# Patient Record
Sex: Male | Born: 1985 | Race: White | Hispanic: No | Marital: Single | Smoking: Former smoker
Health system: Southern US, Community
[De-identification: ages and names within clinical notes are randomized; demographics above are authoritative.]

## PROBLEM LIST (undated history)

## (undated) DIAGNOSIS — D751 Secondary polycythemia: Secondary | ICD-10-CM

## (undated) DIAGNOSIS — A630 Anogenital (venereal) warts: Secondary | ICD-10-CM

## (undated) DIAGNOSIS — M549 Dorsalgia, unspecified: Secondary | ICD-10-CM

## (undated) DIAGNOSIS — R002 Palpitations: Secondary | ICD-10-CM

## (undated) DIAGNOSIS — F988 Other specified behavioral and emotional disorders with onset usually occurring in childhood and adolescence: Secondary | ICD-10-CM

## (undated) DIAGNOSIS — I10 Essential (primary) hypertension: Secondary | ICD-10-CM

## (undated) DIAGNOSIS — F17200 Nicotine dependence, unspecified, uncomplicated: Secondary | ICD-10-CM

## (undated) DIAGNOSIS — G4700 Insomnia, unspecified: Secondary | ICD-10-CM

## (undated) DIAGNOSIS — L408 Other psoriasis: Secondary | ICD-10-CM

## (undated) DIAGNOSIS — K921 Melena: Secondary | ICD-10-CM

## (undated) DIAGNOSIS — K219 Gastro-esophageal reflux disease without esophagitis: Secondary | ICD-10-CM

## (undated) HISTORY — PX: HAND SURGERY: SHX662

## (undated) HISTORY — DX: Anogenital (venereal) warts: A63.0

## (undated) HISTORY — PX: WISDOM TOOTH EXTRACTION: SHX21

## (undated) HISTORY — DX: Secondary polycythemia: D75.1

## (undated) HISTORY — DX: Dorsalgia, unspecified: M54.9

## (undated) HISTORY — DX: Gastro-esophageal reflux disease without esophagitis: K21.9

## (undated) HISTORY — DX: Nicotine dependence, unspecified, uncomplicated: F17.200

## (undated) HISTORY — DX: Other specified behavioral and emotional disorders with onset usually occurring in childhood and adolescence: F98.8

## (undated) HISTORY — DX: Essential (primary) hypertension: I10

## (undated) HISTORY — DX: Palpitations: R00.2

## (undated) HISTORY — DX: Insomnia, unspecified: G47.00

## (undated) HISTORY — DX: Other psoriasis: L40.8

## (undated) HISTORY — DX: Melena: K92.1

---
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
## 2003-06-01 ENCOUNTER — Emergency Department (HOSPITAL_COMMUNITY): Admission: EM | Admit: 2003-06-01 | Discharge: 2003-06-01 | Payer: Self-pay | Admitting: Emergency Medicine

## 2004-05-08 ENCOUNTER — Emergency Department (HOSPITAL_COMMUNITY): Admission: EM | Admit: 2004-05-08 | Discharge: 2004-05-08 | Payer: Self-pay | Admitting: Nurse Practitioner

## 2004-05-08 ENCOUNTER — Ambulatory Visit: Payer: Self-pay | Admitting: Endocrinology

## 2004-05-12 ENCOUNTER — Ambulatory Visit (HOSPITAL_COMMUNITY): Admission: RE | Admit: 2004-05-12 | Discharge: 2004-05-12 | Payer: Self-pay | Admitting: Endocrinology

## 2004-09-08 ENCOUNTER — Emergency Department (HOSPITAL_COMMUNITY): Admission: EM | Admit: 2004-09-08 | Discharge: 2004-09-08 | Payer: Self-pay | Admitting: Emergency Medicine

## 2004-09-14 ENCOUNTER — Ambulatory Visit: Payer: Self-pay | Admitting: Internal Medicine

## 2004-09-24 ENCOUNTER — Ambulatory Visit: Payer: Self-pay

## 2005-07-05 ENCOUNTER — Ambulatory Visit: Payer: Self-pay | Admitting: Internal Medicine

## 2006-10-19 ENCOUNTER — Ambulatory Visit: Payer: Self-pay | Admitting: Internal Medicine

## 2006-10-20 ENCOUNTER — Encounter: Payer: Self-pay | Admitting: Internal Medicine

## 2006-10-20 DIAGNOSIS — K219 Gastro-esophageal reflux disease without esophagitis: Secondary | ICD-10-CM

## 2006-10-20 DIAGNOSIS — F988 Other specified behavioral and emotional disorders with onset usually occurring in childhood and adolescence: Secondary | ICD-10-CM

## 2006-10-20 DIAGNOSIS — L408 Other psoriasis: Secondary | ICD-10-CM

## 2006-10-20 HISTORY — DX: Other psoriasis: L40.8

## 2006-10-20 HISTORY — DX: Other specified behavioral and emotional disorders with onset usually occurring in childhood and adolescence: F98.8

## 2006-10-20 HISTORY — DX: Gastro-esophageal reflux disease without esophagitis: K21.9

## 2006-11-23 ENCOUNTER — Emergency Department (HOSPITAL_COMMUNITY): Admission: EM | Admit: 2006-11-23 | Discharge: 2006-11-23 | Payer: Self-pay | Admitting: Emergency Medicine

## 2006-12-11 ENCOUNTER — Emergency Department (HOSPITAL_COMMUNITY): Admission: EM | Admit: 2006-12-11 | Discharge: 2006-12-11 | Payer: Self-pay | Admitting: *Deleted

## 2006-12-22 ENCOUNTER — Telehealth: Payer: Self-pay | Admitting: Internal Medicine

## 2006-12-23 ENCOUNTER — Telehealth (INDEPENDENT_AMBULATORY_CARE_PROVIDER_SITE_OTHER): Payer: Self-pay | Admitting: *Deleted

## 2007-01-23 ENCOUNTER — Telehealth (INDEPENDENT_AMBULATORY_CARE_PROVIDER_SITE_OTHER): Payer: Self-pay | Admitting: *Deleted

## 2007-02-22 ENCOUNTER — Telehealth (INDEPENDENT_AMBULATORY_CARE_PROVIDER_SITE_OTHER): Payer: Self-pay | Admitting: *Deleted

## 2007-03-27 ENCOUNTER — Telehealth: Payer: Self-pay | Admitting: Internal Medicine

## 2007-04-27 ENCOUNTER — Telehealth: Payer: Self-pay | Admitting: Internal Medicine

## 2007-05-07 ENCOUNTER — Emergency Department (HOSPITAL_COMMUNITY): Admission: EM | Admit: 2007-05-07 | Discharge: 2007-05-08 | Payer: Self-pay | Admitting: Emergency Medicine

## 2007-05-15 ENCOUNTER — Ambulatory Visit: Payer: Self-pay | Admitting: Internal Medicine

## 2007-05-15 DIAGNOSIS — F17200 Nicotine dependence, unspecified, uncomplicated: Secondary | ICD-10-CM

## 2007-05-15 DIAGNOSIS — G4700 Insomnia, unspecified: Secondary | ICD-10-CM

## 2007-05-15 DIAGNOSIS — A630 Anogenital (venereal) warts: Secondary | ICD-10-CM

## 2007-05-15 DIAGNOSIS — M549 Dorsalgia, unspecified: Secondary | ICD-10-CM

## 2007-05-15 HISTORY — DX: Dorsalgia, unspecified: M54.9

## 2007-05-15 HISTORY — DX: Nicotine dependence, unspecified, uncomplicated: F17.200

## 2007-05-15 HISTORY — DX: Insomnia, unspecified: G47.00

## 2007-05-15 HISTORY — DX: Anogenital (venereal) warts: A63.0

## 2007-05-26 ENCOUNTER — Telehealth (INDEPENDENT_AMBULATORY_CARE_PROVIDER_SITE_OTHER): Payer: Self-pay | Admitting: *Deleted

## 2007-07-06 ENCOUNTER — Telehealth: Payer: Self-pay | Admitting: Internal Medicine

## 2007-08-07 ENCOUNTER — Telehealth: Payer: Self-pay | Admitting: Internal Medicine

## 2007-09-05 ENCOUNTER — Telehealth: Payer: Self-pay | Admitting: Internal Medicine

## 2007-10-03 ENCOUNTER — Telehealth (INDEPENDENT_AMBULATORY_CARE_PROVIDER_SITE_OTHER): Payer: Self-pay | Admitting: *Deleted

## 2007-11-06 ENCOUNTER — Telehealth (INDEPENDENT_AMBULATORY_CARE_PROVIDER_SITE_OTHER): Payer: Self-pay | Admitting: *Deleted

## 2007-12-12 ENCOUNTER — Telehealth (INDEPENDENT_AMBULATORY_CARE_PROVIDER_SITE_OTHER): Payer: Self-pay | Admitting: *Deleted

## 2008-01-09 ENCOUNTER — Telehealth (INDEPENDENT_AMBULATORY_CARE_PROVIDER_SITE_OTHER): Payer: Self-pay | Admitting: *Deleted

## 2008-01-25 ENCOUNTER — Ambulatory Visit: Payer: Self-pay | Admitting: Internal Medicine

## 2008-01-25 DIAGNOSIS — I10 Essential (primary) hypertension: Secondary | ICD-10-CM

## 2008-01-25 DIAGNOSIS — R002 Palpitations: Secondary | ICD-10-CM

## 2008-01-25 HISTORY — DX: Palpitations: R00.2

## 2008-01-25 HISTORY — DX: Essential (primary) hypertension: I10

## 2008-01-25 LAB — CONVERTED CEMR LAB
BUN: 9 mg/dL (ref 6–23)
Basophils Relative: 0.1 % (ref 0.0–3.0)
CO2: 31 meq/L (ref 19–32)
Eosinophils Absolute: 0.4 10*3/uL (ref 0.0–0.7)
Eosinophils Relative: 2.9 % (ref 0.0–5.0)
GFR calc Af Amer: 153 mL/min
GFR calc non Af Amer: 126 mL/min
Hemoglobin: 18.4 g/dL — ABNORMAL HIGH (ref 13.0–17.0)
Lymphocytes Relative: 20.7 % (ref 12.0–46.0)
MCHC: 35.5 g/dL (ref 30.0–36.0)
Monocytes Absolute: 0.9 10*3/uL (ref 0.1–1.0)
Monocytes Relative: 7.2 % (ref 3.0–12.0)
WBC: 12.5 10*3/uL — ABNORMAL HIGH (ref 4.5–10.5)

## 2008-02-05 ENCOUNTER — Telehealth (INDEPENDENT_AMBULATORY_CARE_PROVIDER_SITE_OTHER): Payer: Self-pay | Admitting: *Deleted

## 2008-03-08 ENCOUNTER — Telehealth (INDEPENDENT_AMBULATORY_CARE_PROVIDER_SITE_OTHER): Payer: Self-pay | Admitting: *Deleted

## 2008-04-04 ENCOUNTER — Telehealth (INDEPENDENT_AMBULATORY_CARE_PROVIDER_SITE_OTHER): Payer: Self-pay | Admitting: *Deleted

## 2008-05-07 ENCOUNTER — Telehealth (INDEPENDENT_AMBULATORY_CARE_PROVIDER_SITE_OTHER): Payer: Self-pay | Admitting: *Deleted

## 2008-05-31 ENCOUNTER — Ambulatory Visit: Payer: Self-pay | Admitting: Internal Medicine

## 2008-05-31 ENCOUNTER — Encounter: Payer: Self-pay | Admitting: Internal Medicine

## 2008-05-31 DIAGNOSIS — S93409A Sprain of unspecified ligament of unspecified ankle, initial encounter: Secondary | ICD-10-CM | POA: Insufficient documentation

## 2008-05-31 LAB — CONVERTED CEMR LAB
BUN: 10 mg/dL (ref 6–23)
Basophils Absolute: 0 10*3/uL (ref 0.0–0.1)
Basophils Relative: 0 % (ref 0.0–3.0)
CO2: 32 meq/L (ref 19–32)
Calcium: 8.7 mg/dL (ref 8.4–10.5)
Chloride: 105 meq/L (ref 96–112)
Creatinine, Ser: 0.8 mg/dL (ref 0.4–1.5)
Eosinophils Absolute: 0.5 10*3/uL (ref 0.0–0.7)
Eosinophils Relative: 5.5 % — ABNORMAL HIGH (ref 0.0–5.0)
GFR calc non Af Amer: 125.07 mL/min (ref >60)
Glucose, Bld: 81 mg/dL (ref 70–99)
HCT: 48.6 % (ref 39.0–52.0)
Hemoglobin: 17.4 g/dL — ABNORMAL HIGH (ref 13.0–17.0)
Lymphocytes Relative: 29.1 % (ref 12.0–46.0)
Lymphs Abs: 2.8 10*3/uL (ref 0.7–4.0)
MCHC: 35.8 g/dL (ref 30.0–36.0)
MCV: 92.5 fL (ref 78.0–100.0)
Monocytes Absolute: 0.6 10*3/uL (ref 0.1–1.0)
Monocytes Relative: 6.7 % (ref 3.0–12.0)
Neutro Abs: 5.7 10*3/uL (ref 1.4–7.7)
Neutrophils Relative %: 58.7 % (ref 43.0–77.0)
Platelets: 257 10*3/uL (ref 150.0–400.0)
Potassium: 3.8 meq/L (ref 3.5–5.1)
RBC: 5.25 M/uL (ref 4.22–5.81)
RDW: 11.7 % (ref 11.5–14.6)
Sodium: 141 meq/L (ref 135–145)
TSH: 1.83 microintl units/mL (ref 0.35–5.50)
WBC: 9.6 10*3/uL (ref 4.5–10.5)

## 2008-06-03 ENCOUNTER — Telehealth (INDEPENDENT_AMBULATORY_CARE_PROVIDER_SITE_OTHER): Payer: Self-pay | Admitting: *Deleted

## 2008-06-11 ENCOUNTER — Ambulatory Visit: Payer: Self-pay

## 2008-06-11 ENCOUNTER — Encounter: Payer: Self-pay | Admitting: Internal Medicine

## 2008-07-08 ENCOUNTER — Telehealth (INDEPENDENT_AMBULATORY_CARE_PROVIDER_SITE_OTHER): Payer: Self-pay | Admitting: *Deleted

## 2008-08-13 ENCOUNTER — Telehealth (INDEPENDENT_AMBULATORY_CARE_PROVIDER_SITE_OTHER): Payer: Self-pay | Admitting: *Deleted

## 2008-09-04 ENCOUNTER — Ambulatory Visit: Payer: Self-pay | Admitting: Internal Medicine

## 2008-09-04 DIAGNOSIS — R21 Rash and other nonspecific skin eruption: Secondary | ICD-10-CM | POA: Insufficient documentation

## 2008-09-05 ENCOUNTER — Encounter: Payer: Self-pay | Admitting: Internal Medicine

## 2008-09-05 ENCOUNTER — Telehealth: Payer: Self-pay | Admitting: Internal Medicine

## 2008-09-06 ENCOUNTER — Telehealth: Payer: Self-pay | Admitting: Internal Medicine

## 2008-09-10 ENCOUNTER — Telehealth (INDEPENDENT_AMBULATORY_CARE_PROVIDER_SITE_OTHER): Payer: Self-pay | Admitting: *Deleted

## 2008-09-11 ENCOUNTER — Telehealth: Payer: Self-pay | Admitting: Internal Medicine

## 2008-10-14 ENCOUNTER — Telehealth (INDEPENDENT_AMBULATORY_CARE_PROVIDER_SITE_OTHER): Payer: Self-pay | Admitting: *Deleted

## 2008-11-14 ENCOUNTER — Telehealth: Payer: Self-pay | Admitting: Internal Medicine

## 2008-12-18 ENCOUNTER — Telehealth: Payer: Self-pay | Admitting: Internal Medicine

## 2009-01-13 ENCOUNTER — Telehealth: Payer: Self-pay | Admitting: Internal Medicine

## 2009-02-10 ENCOUNTER — Telehealth: Payer: Self-pay | Admitting: Internal Medicine

## 2009-03-17 ENCOUNTER — Telehealth: Payer: Self-pay | Admitting: Internal Medicine

## 2009-04-13 ENCOUNTER — Emergency Department (HOSPITAL_COMMUNITY): Admission: EM | Admit: 2009-04-13 | Discharge: 2009-04-13 | Payer: Self-pay | Admitting: Emergency Medicine

## 2009-04-15 ENCOUNTER — Emergency Department (HOSPITAL_COMMUNITY): Admission: EM | Admit: 2009-04-15 | Discharge: 2009-04-15 | Payer: Self-pay | Admitting: Emergency Medicine

## 2009-04-22 ENCOUNTER — Telehealth: Payer: Self-pay | Admitting: Internal Medicine

## 2009-05-19 ENCOUNTER — Telehealth: Payer: Self-pay | Admitting: Internal Medicine

## 2009-05-20 ENCOUNTER — Ambulatory Visit: Payer: Self-pay | Admitting: Internal Medicine

## 2009-05-20 DIAGNOSIS — J019 Acute sinusitis, unspecified: Secondary | ICD-10-CM

## 2009-05-20 DIAGNOSIS — D751 Secondary polycythemia: Secondary | ICD-10-CM

## 2009-05-20 HISTORY — DX: Secondary polycythemia: D75.1

## 2009-05-20 LAB — CONVERTED CEMR LAB
BUN: 9 mg/dL (ref 6–23)
Calcium: 10.3 mg/dL (ref 8.4–10.5)
Hemoglobin: 17.4 g/dL — ABNORMAL HIGH (ref 13.0–17.0)
Lymphocytes Relative: 29.6 % (ref 12.0–46.0)
Lymphs Abs: 2.8 10*3/uL (ref 0.7–4.0)
MCV: 93.1 fL (ref 78.0–100.0)
Monocytes Absolute: 0.9 10*3/uL (ref 0.1–1.0)
Monocytes Relative: 9.2 % (ref 3.0–12.0)
Neutrophils Relative %: 55.8 % (ref 43.0–77.0)
RBC: 5.52 M/uL (ref 4.22–5.81)
Sodium: 141 meq/L (ref 135–145)
WBC: 9.3 10*3/uL (ref 4.5–10.5)

## 2009-05-22 ENCOUNTER — Encounter (INDEPENDENT_AMBULATORY_CARE_PROVIDER_SITE_OTHER): Payer: Self-pay | Admitting: *Deleted

## 2009-05-22 ENCOUNTER — Telehealth: Payer: Self-pay | Admitting: Internal Medicine

## 2009-06-16 ENCOUNTER — Telehealth: Payer: Self-pay | Admitting: Internal Medicine

## 2009-07-18 ENCOUNTER — Telehealth: Payer: Self-pay | Admitting: Internal Medicine

## 2009-08-20 ENCOUNTER — Telehealth: Payer: Self-pay | Admitting: Internal Medicine

## 2009-09-19 ENCOUNTER — Telehealth: Payer: Self-pay | Admitting: Internal Medicine

## 2009-10-17 ENCOUNTER — Telehealth: Payer: Self-pay | Admitting: Internal Medicine

## 2009-11-21 ENCOUNTER — Telehealth: Payer: Self-pay | Admitting: Internal Medicine

## 2009-12-09 ENCOUNTER — Ambulatory Visit: Payer: Self-pay | Admitting: Internal Medicine

## 2009-12-09 ENCOUNTER — Encounter: Payer: Self-pay | Admitting: Internal Medicine

## 2009-12-09 DIAGNOSIS — M25539 Pain in unspecified wrist: Secondary | ICD-10-CM

## 2009-12-09 DIAGNOSIS — M25579 Pain in unspecified ankle and joints of unspecified foot: Secondary | ICD-10-CM

## 2009-12-15 ENCOUNTER — Telehealth (INDEPENDENT_AMBULATORY_CARE_PROVIDER_SITE_OTHER): Payer: Self-pay | Admitting: *Deleted

## 2009-12-16 ENCOUNTER — Telehealth: Payer: Self-pay | Admitting: Internal Medicine

## 2009-12-16 ENCOUNTER — Encounter (INDEPENDENT_AMBULATORY_CARE_PROVIDER_SITE_OTHER): Payer: Self-pay | Admitting: *Deleted

## 2009-12-17 ENCOUNTER — Telehealth (INDEPENDENT_AMBULATORY_CARE_PROVIDER_SITE_OTHER): Payer: Self-pay | Admitting: *Deleted

## 2009-12-18 ENCOUNTER — Ambulatory Visit: Payer: Self-pay | Admitting: Internal Medicine

## 2009-12-18 DIAGNOSIS — R1032 Left lower quadrant pain: Secondary | ICD-10-CM | POA: Insufficient documentation

## 2009-12-18 DIAGNOSIS — K921 Melena: Secondary | ICD-10-CM

## 2009-12-18 HISTORY — DX: Melena: K92.1

## 2009-12-18 LAB — CONVERTED CEMR LAB
Albumin: 4.4 g/dL (ref 3.5–5.2)
Alkaline Phosphatase: 82 units/L (ref 39–117)
BUN: 16 mg/dL (ref 6–23)
Basophils Absolute: 0.1 10*3/uL (ref 0.0–0.1)
Basophils Relative: 0.9 % (ref 0.0–3.0)
CO2: 32 meq/L (ref 19–32)
Calcium: 9.1 mg/dL (ref 8.4–10.5)
Chloride: 95 meq/L — ABNORMAL LOW (ref 96–112)
Creatinine, Ser: 0.8 mg/dL (ref 0.4–1.5)
Eosinophils Absolute: 0.5 10*3/uL (ref 0.0–0.7)
Eosinophils Relative: 4.6 % (ref 0.0–5.0)
GFR calc non Af Amer: 116.79 mL/min (ref >60)
Glucose, Bld: 79 mg/dL (ref 70–99)
HCT: 48 % (ref 39.0–52.0)
Hemoglobin, Urine: NEGATIVE
Hemoglobin: 17.2 g/dL — ABNORMAL HIGH (ref 13.0–17.0)
Ketones, ur: NEGATIVE mg/dL
Lymphocytes Relative: 21.1 % (ref 12.0–46.0)
Lymphs Abs: 2.1 10*3/uL (ref 0.7–4.0)
MCV: 91.4 fL (ref 78.0–100.0)
Monocytes Absolute: 1 10*3/uL (ref 0.1–1.0)
Neutro Abs: 6.5 10*3/uL (ref 1.4–7.7)
Neutrophils Relative %: 64 % (ref 43.0–77.0)
Potassium: 4.1 meq/L (ref 3.5–5.1)
Total Protein, Urine: NEGATIVE mg/dL
Urine Glucose: NEGATIVE mg/dL
Urobilinogen, UA: 1 (ref 0.0–1.0)
pH: 7 (ref 5.0–8.0)

## 2009-12-19 ENCOUNTER — Encounter: Payer: Self-pay | Admitting: Internal Medicine

## 2009-12-19 ENCOUNTER — Telehealth: Payer: Self-pay | Admitting: Internal Medicine

## 2009-12-19 ENCOUNTER — Telehealth (INDEPENDENT_AMBULATORY_CARE_PROVIDER_SITE_OTHER): Payer: Self-pay | Admitting: *Deleted

## 2010-02-23 ENCOUNTER — Encounter
Admission: RE | Admit: 2010-02-23 | Payer: Self-pay | Source: Home / Self Care | Attending: Orthopedic Surgery | Admitting: Orthopedic Surgery

## 2010-03-03 ENCOUNTER — Encounter
Admission: RE | Admit: 2010-03-03 | Discharge: 2010-03-24 | Payer: Self-pay | Source: Home / Self Care | Attending: Orthopedic Surgery | Admitting: Orthopedic Surgery

## 2010-03-24 ENCOUNTER — Telehealth: Payer: Self-pay | Admitting: Internal Medicine

## 2010-03-24 NOTE — Letter (Signed)
Summary: Out of Work  [facility]  [address] [place], [place] [address]   Phone: [phone]  Fax: [phone]    December 09, 2009   Employee:  Dasan T Schlereth    To Whom It May Concern:   For Medical reasons, please excuse the above named employee from work for the following dates:  Start:   oct 16. 2011  End:   Dec 14, 2009 - to return to work Dec 15, 2009  If you need additional information, please feel free to contact our office.         Sincerely,    Corwin Levins MD
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
## 2010-03-24 NOTE — Progress Notes (Signed)
Summary: Adderall  Phone Note Call from Patient Call back at [facility] Phone [phone]   Summary of Call: Patient is requesting change of med to Adderall 15mg  #120 due to shortage of medication.  Initial call taken by: Lamar Sprinkles, CMA,  December 19, 2009 12:09 PM    New/Updated Medications: ADDERALL 15 MG TABS (AMPHETAMINE-DEXTROAMPHETAMINE) 2 by mouth two times a day  - to fill Dec 19, 2009 Prescriptions: ADDERALL 15 MG TABS (AMPHETAMINE-DEXTROAMPHETAMINE) 2 by mouth two times a day  - to fill Dec 19, 2009  #120 x 0   Entered and Authorized by:   Corwin Levins MD   Signed by:   Corwin Levins MD on 12/19/2009   Method used:   Print then Give to Patient   RxID:   [id number]  done hardcopy to LIM side B - dahlia Corwin Levins MD  December 19, 2009 1:13 PM   Pt informed via VM, to bring back original Rx for new Rx Margaret Pyle, CMA  December 19, 2009 1:17 PM

## 2010-03-24 NOTE — Assessment & Plan Note (Signed)
Summary: COUGH,COLD,RUNNY NOSE/NO FEVER/CD   Vital Signs:  Patient profile:   25 year old male Height:      72 inches Weight:      230 pounds BMI:     31.31 O2 Sat:      98 % on Room air Temp:     98.1 degrees F oral Pulse rate:   88 / minute BP sitting:   112 / 70  (left arm) Cuff size:   large  Vitals Entered ByZella Ball Ewing (May 20, 2009 11:22 AM)  O2 Flow:  Room air CC: nasal congestion, cough, fatigue/RE   CC:  nasal congestion, cough, and fatigue/RE.  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 Today here with 6  days hx of worsening facial pain, pressure, fever and greenish d/c , especaily worse in the past 2 days, hearing been worse and muffled.    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 1 By Mouth Two Times A Day - To Fill May 21, 2009 2)  Tylenol .[name].. 1 Prn 3)  Advil .[name].. 1 Prn 4)  Ranitidine Hcl 300 Mg  Tabs (Ranitidine Hcl) .[name].. 1po Qd 5)  Tramadol Hcl 50 Mg  Tabs (Tramadol Hcl) .[name].. 1 - 2 By Mouth Qid Prn 6)  Zolpidem Tartrate 10 Mg  Tabs (Zolpidem Tartrate) .[name].. 1 By Mouth  At Bedtime As Needed 7)  Clobetasol Propionate 0.05 % Crea (Clobetasol Propionate) .... Use Asd Two Times A Day As Needed 8)  Metoprolol Tartrate 50 Mg Tabs (Metoprolol Tartrate) .... 1/2 By Mouth Two Times A Day 9)  Diclofenac Sodium 75 Mg Tbec (Diclofenac Sodium) .[name].. 1 By Mouth Two Times A Day As Needed 10)  Prednisone 10 Mg Tabs (Prednisone) .... 4po Qd For 3days, Then 3po Qd For 3days, Then 2po Qd For 3days, Then 1po Qd For 3 Days, Then Stop 11)  Hydroxyzine Hcl 25 Mg Tabs (Hydroxyzine Hcl) .[name].. 1 -2 By Mouth Q 6 Hrs As Needed Itching  Current Medications (verified): 1)  Adderall 30 Mg  Tabs (Amphetamine-Dextroamphetamine) .[name].. 1 By Mouth Two Times A Day - To Fill May 21, 2009 2)  Tylenol .[name].. 1 Prn 3)  Advil .[name].. 1 Prn 4)  Ranitidine Hcl 300 Mg  Tabs (Ranitidine Hcl) .[name].. 1po Qd 5)  Tramadol Hcl 50 Mg  Tabs (Tramadol Hcl) .[name].. 1 - 2 By Mouth Qid Prn 6)  Zolpidem Tartrate 10 Mg  Tabs (Zolpidem Tartrate) .[name].. 1 By Mouth At Bedtime As Needed 7)  Clobetasol Propionate 0.05 % Crea (Clobetasol Propionate) .... Use Asd Two Times A Day As Needed 8)  Metoprolol  Tartrate 50 Mg Tabs (Metoprolol Tartrate) .... 1/2 By Mouth Two Times A Day 9)  Diclofenac Sodium 75 Mg Tbec (Diclofenac Sodium) .[name].. 1 By Mouth Two Times A Day As Needed 10)  Hydroxyzine Hcl 25 Mg Tabs (Hydroxyzine Hcl) .[name].. 1 -2 By Mouth Q 6 Hrs As Needed Itching 11)  Dermazinc Spray 0.25 % Liqd (Pyrithione Zinc) .... Use Asd  - Otc 12)  Cephalexin 500 Mg Caps (Cephalexin) .[name].. 1 By Mouth Three Times A Day 13)  Chantix Continuing Month Pak 1 Mg Tabs (Varenicline Tartrate) .... Use Asd 1 By Mouth Once Daily  Allergies (verified): No Known Drug Allergies  Past History:  Past Surgical History: Last updated: 05/15/2007 Denies surgical history  Social History: Last updated: 05/15/2007 Single Current Smoker  Risk Factors: Smoking Status: current (05/15/2007)  Past Medical  History: add psoriasis GERD Hypertension palpitations polycythemia  Family History: Reviewed history from 01/25/2008 and no changes required. grandfather with HTN father with throat cancer  Review of Systems       all otherwise negative per pt -    Physical Exam  General:  alert and overweight-appearing., mild ill  Head:  normocephalic and atraumatic.   Eyes:  vision grossly intact, pupils equal, and pupils round.   Ears:  bilat tm's red, sinus tender bialt Nose:  nasal dischargemucosal pallor and mucosal edema.   Mouth:  pharyngeal erythema and fair dentition.   Neck:  supple and no masses.   Lungs:  normal respiratory effort and normal breath sounds.   Heart:  normal rate and regular rhythm.   Extremities:  no edema, no erythema  Skin:  large psoriatic plaque to elbows   Impression & Recommendations:  Problem # 1:  SINUSITIS- ACUTE-NOS (ICD-461.9)  His updated medication list for this problem includes:    Cephalexin 500 Mg Caps (Cephalexin) .[name]... 1 by mouth three times a day treat as above, f/u any worsening signs or symptoms   Problem # 2:  HYPERTENSION (ICD-401.9)  His updated medication list for this problem includes:    Metoprolol Tartrate 50 Mg Tabs (Metoprolol tartrate) .[name]... 1/2 by mouth two times a day  Orders: TLB-BMP (Basic Metabolic Panel-BMET) (80048-METABOL)  BP today: 112/70 Prior BP: 128/72 (09/04/2008)  Labs Reviewed: K+: 3.8 (05/31/2008) Creat: : 0.8 (05/31/2008)    stable overall by hx and exam, ok to continue meds/tx as is   Problem # 3:  PSORIASIS (ICD-696.1)  for OTC med per pt request, also refer derm  Orders: Dermatology Referral (Derma)  Problem # 4:  POLYCYTHEMIA (ICD-289.0)  re-check, may need heme eval, to quit smoking   Orders: TLB-CBC Platelet - w/Differential (85025-CBCD)  Complete Medication List: 1)  Adderall 30 Mg Tabs (Amphetamine-dextroamphetamine) .[name].. 1 by mouth two times a day - to fill May 21, 2009 2)   Tylenol  .[name].. 1 prn 3)  Advil  .[name].. 1 prn 4)  Ranitidine Hcl 300 Mg Tabs (Ranitidine hcl) .[name].. 1po qd 5)  Tramadol Hcl 50 Mg Tabs (Tramadol hcl) .[name].. 1 - 2 by mouth qid prn 6)  Zolpidem Tartrate 10 Mg Tabs (Zolpidem tartrate) .[name].. 1 by mouth at bedtime as needed 7)  Clobetasol Propionate 0.05 % Crea (Clobetasol propionate) .... Use asd two times a day as needed 8)  Metoprolol Tartrate 50 Mg Tabs (Metoprolol tartrate) .... 1/2 by mouth two times a day 9)  Diclofenac Sodium 75 Mg Tbec (Diclofenac sodium) .[name].. 1 by mouth two times a day as needed 10)  Hydroxyzine Hcl 25 Mg  Tabs (Hydroxyzine hcl) .[name].. 1 -2 by mouth q 6 hrs as needed itching 11)  Dermazinc Spray 0.25 % Liqd (Pyrithione zinc) .... Use asd  - otc 12)  Cephalexin 500 Mg Caps (Cephalexin) .[name].. 1 by mouth three times a day 13)  Chantix Continuing Month Pak 1 Mg Tabs (Varenicline tartrate) .... Use asd 1 by mouth once daily  Patient Instructions: 1)  Please take all new medications as prescribed - the antibitic 2)  you can try the dermazinc (it is OTC now), and the chantix 3)  You will be contacted about the referral(s) to: dermatology 4)  Continue all previous medications as before this visit  5)  Please go to the Lab in the basement for your blood tests (for the blood counts)  6)  Please schedule a follow-up appointment in 1 year or sooner if needed Prescriptions: CHANTIX CONTINUING MONTH PAK 1 MG TABS (VARENICLINE TARTRATE) use asd 1 by mouth once daily  #1pk x 1   Entered and Authorized by:   Corwin Levins MD   Signed by:   Corwin Levins MD on 05/20/2009   Method used:   Print then Give to Patient   RxID:   [id number] CHANTIX STARTING MONTH PAK 0.5 MG X 11 & 1 MG X 42 TABS (VARENICLINE TARTRATE) use asd 1 by mouth once daily  #1pk x 0   Entered and Authorized by:   Corwin Levins MD   Signed by:   Corwin Levins MD on 05/20/2009   Method used:   Print then Give to Patient   RxID:   [id number] CEPHALEXIN 500 MG CAPS  (CEPHALEXIN) 1 by mouth three times a day  #30 x 0   Entered and Authorized by:   Corwin Levins MD   Signed by:   Corwin Levins MD on 05/20/2009   Method used:   Print then Give to Patient   RxID:   [id number]
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
## 2010-03-24 NOTE — Letter (Signed)
Summary: Out of Work  [facility]  [address] [place], [place] [address]   Phone: [phone]  Fax: [phone]    May 22, 2009   Employee:  Bode T Bookwalter    To Whom It May Concern:   For Medical reasons, please excuse the above named employee from work for the following dates:  Start:   Monday March 28th 2011  End:   Wednesday March 30th 2011 -- To return to work 05/22/2009  If you need additional information, please feel free to contact our office.         Sincerely,    Oliver Barre MD

## 2010-03-24 NOTE — Progress Notes (Signed)
Summary: Adderall  Phone Note Call from Patient Call back at Home Phone [phone]   Caller: Patient Summary of Call: Pt called requesting refill of Adderall Initial call taken by: Margaret Pyle, CMA,  October 17, 2009 1:41 PM  Follow-up for Phone Call        done hardcopy to LIM side B - dahlia  Follow-up by: Corwin Levins MD,  October 17, 2009 1:43 PM  Additional Follow-up for Phone Call Additional follow up Details #1::        Pt informed, Rx in cabinet for pt pick up Additional Follow-up by: Margaret Pyle, CMA,  October 17, 2009 1:48 PM    New/Updated Medications: ADDERALL 30 MG  TABS (AMPHETAMINE-DEXTROAMPHETAMINE) 1 by mouth two times a day - to fill Oct 19, 2009 Prescriptions: ADDERALL 30 MG  TABS (AMPHETAMINE-DEXTROAMPHETAMINE) 1 by mouth two times a day - to fill Oct 19, 2009  #60 x 0   Entered and Authorized by:   Corwin Levins MD   Signed by:   Corwin Levins MD on 10/17/2009   Method used:   Print then Give to Patient   RxID:   [id number]

## 2010-03-24 NOTE — Progress Notes (Signed)
Summary: Adderall  Phone Note Call from Patient Call back at Home Phone [phone]   Caller: Patient Summary of Call: pt called requesting refill of Adderall Initial call taken by: Margaret Pyle, CMA,  June 16, 2009 1:21 PM  Follow-up for Phone Call        Pt informed, Rx faxed to pharmacy Follow-up by: Margaret Pyle, CMA,  June 16, 2009 1:38 PM    New/Updated Medications: ADDERALL 30 MG  TABS (AMPHETAMINE-DEXTROAMPHETAMINE) 1 by mouth two times a day - to fill Jun 20, 2009 Prescriptions: ADDERALL 30 MG  TABS (AMPHETAMINE-DEXTROAMPHETAMINE) 1 by mouth two times a day - to fill Jun 20, 2009  #60 x 0   Entered and Authorized by:   Corwin Levins MD   Signed by:   Corwin Levins MD on 06/16/2009   Method used:   Print then Give to Patient   RxID:   [id number]  done hardcopy to LIM side B - dahlia  Corwin Levins MD  June 16, 2009 1:29 PM

## 2010-03-24 NOTE — Progress Notes (Signed)
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--pt is wondering if he needs to come in for OV to receive patches?--please advise Initial call taken by: Brenton Grills MA,  August 20, 2009 2:47 PM  Follow-up for Phone Call        no OV needed - both done hardcopy to LIM side B - dahlia  Follow-up by: Corwin Levins MD,  August 20, 2009 3:05 PM  Additional Follow-up for Phone Call Additional follow up Details #1::        informed pt both rx's ready for pickup Additional Follow-up by: Brenton Grills MA,  August 20, 2009 3:15 PM    New/Updated Medications: ADDERALL 30 MG  TABS (AMPHETAMINE-DEXTROAMPHETAMINE) 1 by mouth two times a day - to fill August 19, 2009 TRANSDERM-SCOP 1.5 MG PT72 (SCOPOLAMINE BASE) 1 patch q 3 days as needed Prescriptions: TRANSDERM-SCOP 1.5 MG PT72 (SCOPOLAMINE BASE) 1 patch q 3 days as needed  #5 x 0   Entered and Authorized by:   Corwin Levins MD   Signed by:   Corwin Levins MD on 08/20/2009   Method used:   Print then Give to Patient   RxID:   [id number] ADDERALL 30 MG  TABS (AMPHETAMINE-DEXTROAMPHETAMINE) 1 by mouth two times a day - to fill August 19, 2009  #60 x 0   Entered and Authorized by:   Corwin Levins MD   Signed by:   Corwin Levins MD on 08/20/2009   Method used:   Print then Give to Patient   RxID:   [id number]

## 2010-03-24 NOTE — Progress Notes (Signed)
Summary: out of work note  Phone Note Call from Patient Call back at [employer] [phone]   Caller: Patient Summary of Call: pt called requesting an out of work note for 3/28, 3/29, and 3/30. pt saw MD on 3/29. Initial call taken by: Margaret Pyle, CMA,  May 22, 2009 2:22 PM/ Sydell Axon, SMA  Follow-up for Phone Call        ok for note, i believe we discussed this, and I did not do the note at the OV Follow-up by: Corwin Levins MD,  May 22, 2009 2:27 PM  Additional Follow-up for Phone Call Additional follow up Details #1::        work note completed and faxed per pt request to [phone] Additional Follow-up by: Margaret Pyle, CMA,  May 22, 2009 2:36 PM

## 2010-03-24 NOTE — Progress Notes (Signed)
Summary: Adderall  Phone Note Call from Patient Call back at Home Phone [phone]   Caller: Patient Summary of Call: pt called requesting refill of Adderall 30mg  Initial call taken by: Margaret Pyle, CMA,  Jul 18, 2009 11:55 AM  Follow-up for Phone Call        pt informed, Rx in cabinet for pt pick up Follow-up by: Margaret Pyle, CMA,  Jul 18, 2009 1:31 PM    New/Updated Medications: ADDERALL 30 MG  TABS (AMPHETAMINE-DEXTROAMPHETAMINE) 1 by mouth two times a day - to fill Jul 20, 2009 Prescriptions: ADDERALL 30 MG  TABS (AMPHETAMINE-DEXTROAMPHETAMINE) 1 by mouth two times a day - to fill Jul 20, 2009  #60 x 0   Entered and Authorized by:   Corwin Levins MD   Signed by:   Corwin Levins MD on 07/18/2009   Method used:   Print then Give to Patient   RxID:   [id number]  done hardcopy to LIM side B - dahlia  Corwin Levins MD  Jul 18, 2009 12:09 PM

## 2010-03-24 NOTE — Progress Notes (Signed)
  Phone Note Other Incoming   Request: Send information Summary of Call: Request for records received from Gae Dry, MD. Request forwarded to [employer].
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
## 2010-03-24 NOTE — Progress Notes (Signed)
Summary: Work Note, Meds  Phone Note Call from Patient Call back at [employer] [phone]   Caller: Patient Summary of Call: Pt called stating that he was told at OV that if ne heeded more pain meds and muscle relaxers originally Rxd in ER to call. Pt is out and is requesting Hydrocodone and Flexeril (unsure of strength). Pt is also requesting extention of work note to 12/23/2009. Pt says he attempted to return to work yesterday but had to leave due to back pain. Initial call taken by: Margaret Pyle, CMA,  December 16, 2009 10:00 AM  Follow-up for Phone Call        ok for extension of work note  I will need to check on med refill status /please return this note when work note done Follow-up by: Corwin Levins MD,  December 16, 2009 1:04 PM  Additional Follow-up for Phone Call Additional follow up Details #1::        Completed work note Additional Follow-up by: Zella Ball Ewing CMA Duncan Dull),  December 16, 2009 1:51 PM    Additional Follow-up for Phone Call Additional follow up Details #2::    ok for rx - done hardcopy to LIM side B - dahlia  Follow-up by: Corwin Levins MD,  December 16, 2009 5:23 PM  Additional Follow-up for Phone Call Additional follow up Details #3:: Details for Additional Follow-up Action Taken: Pt informed via VM, Rx in cabinet for pt pick up Additional Follow-up by: Margaret Pyle, CMA,  December 17, 2009 7:58 AM  New/Updated Medications: HYDROCODONE-ACETAMINOPHEN 5-325 MG TABS (HYDROCODONE-ACETAMINOPHEN) 1 po q 6 hrs as needed pain FLEXERIL 5 MG TABS (CYCLOBENZAPRINE HCL) 1 by mouth three times a day as needed Prescriptions: FLEXERIL 5 MG TABS (CYCLOBENZAPRINE HCL) 1 by mouth three times a day as needed  #60 x 0   Entered and Authorized by:   Corwin Levins MD   Signed by:   Corwin Levins MD on 12/16/2009   Method used:   Print then Give to Patient   RxID:   [id number] HYDROCODONE-ACETAMINOPHEN 5-325 MG TABS (HYDROCODONE-ACETAMINOPHEN) 1 po q 6 hrs  as needed pain  #60 x 0   Entered and Authorized by:   Corwin Levins MD   Signed by:   Corwin Levins MD on 12/16/2009   Method used:   Print then Give to Patient   RxID:   [id number]

## 2010-03-24 NOTE — Progress Notes (Signed)
Summary: Adderall  Phone Note Call from Patient Call back at Home Phone [phone]   Caller: Patient Summary of Call: pt called requesting refill of Adderall Initial call taken by: Margaret Pyle, CMA,  April 22, 2009 9:49 AM

## 2010-03-24 NOTE — Progress Notes (Signed)
Summary: Adderall  Phone Note Call from Patient Call back at Home Phone [phone]   Caller: Patient Summary of Call: Pt called requesting refill of Adderall Initial call taken by: Margaret Pyle, CMA,  November 21, 2009 12:54 PM  Follow-up for Phone Call        done hardcopy to LIM side B - dahlia  Follow-up by: Corwin Levins MD,  November 21, 2009 1:19 PM  Additional Follow-up for Phone Call Additional follow up Details #1::        Pt informed via VM, Rx in cabinet for pt pick up Additional Follow-up by: Margaret Pyle, CMA,  November 21, 2009 1:26 PM    New/Updated Medications: ADDERALL 30 MG  TABS (AMPHETAMINE-DEXTROAMPHETAMINE) 1 by mouth two times a day - to fill sept 30, 2011 Prescriptions: ADDERALL 30 MG  TABS (AMPHETAMINE-DEXTROAMPHETAMINE) 1 by mouth two times a day - to fill sept 30, 2011  #60 x 0   Entered and Authorized by:   Corwin Levins MD   Signed by:   Corwin Levins MD on 11/21/2009   Method used:   Print then Give to Patient   RxID:   [id number]

## 2010-03-24 NOTE — Progress Notes (Signed)
  Phone Note Other Incoming   Request: Send information Summary of Call: Request for records received from [employer]. Request forwarded to [employer].     

## 2010-03-24 NOTE — Progress Notes (Signed)
Summary: rx refill  Phone Note Call from Patient Call back at [facility] Phone [phone]   Summary of Call: Patient left message on triage requesting Adderall refill. Initial call taken by: Lucious Groves,  May 19, 2009 3:44 PM  Follow-up for Phone Call        left message on voicemail to call back to office. Follow-up by: Lucious Groves,  May 20, 2009 8:46 AM  Additional Follow-up for Phone Call Additional follow up Details #1::        patient notified. Additional Follow-up by: Lucious Groves,  May 20, 2009 8:55 AM    New/Updated Medications: ADDERALL 30 MG  TABS (AMPHETAMINE-DEXTROAMPHETAMINE) 1 by mouth two times a day - to fill May 21, 2009 Prescriptions: ADDERALL 30 MG  TABS (AMPHETAMINE-DEXTROAMPHETAMINE) 1 by mouth two times a day - to fill May 21, 2009  #60 x 0   Entered and Authorized by:   Corwin Levins MD   Signed by:   Corwin Levins MD on 05/19/2009   Method used:   Print then Give to Patient   RxID:   [id number]  done hardcopy to LIM side B - dahlia Corwin Levins MD  May 19, 2009 5:37 PM

## 2010-03-24 NOTE — Progress Notes (Signed)
Summary: Adderall  Phone Note Call from Patient Call back at Home Phone [phone]   Caller: Patient Summary of Call: pt called requesting refills of Adderall Initial call taken by: Margaret Pyle, CMA,  March 17, 2009 9:26 AM  Follow-up for Phone Call        done hardcopy to LIM side B - dahlia  Follow-up by: Corwin Levins MD,  March 17, 2009 12:55 PM  Additional Follow-up for Phone Call Additional follow up Details #1::        pt informed via VM. told to call back with any further questions or concers. rx in cabinet for pick up Additional Follow-up by: Margaret Pyle, CMA,  March 17, 2009 1:16 PM    New/Updated Medications: ADDERALL 30 MG  TABS (AMPHETAMINE-DEXTROAMPHETAMINE) 1 by mouth two times a day - to fill Mar 18, 2009 Prescriptions: ADDERALL 30 MG  TABS (AMPHETAMINE-DEXTROAMPHETAMINE) 1 by mouth two times a day - to fill Mar 18, 2009  #60 x 0   Entered and Authorized by:   Corwin Levins MD   Signed by:   Corwin Levins MD on 03/17/2009   Method used:   Print then Give to Patient   RxID:   [id number]

## 2010-03-24 NOTE — Progress Notes (Signed)
Summary: Adderall  Phone Note Call from Patient Call back at Home Phone [phone]   Caller: Patient Summary of Call: Pt called requesting refills of Adderall 30mg  Initial call taken by: Margaret Pyle, CMA,  September 19, 2009 1:05 PM  Follow-up for Phone Call        Pt informed, Rx in cabinet for pt pick up Follow-up by: Margaret Pyle, CMA,  September 19, 2009 1:37 PM    New/Updated Medications: ADDERALL 30 MG  TABS (AMPHETAMINE-DEXTROAMPHETAMINE) 1 by mouth two times a day - to fill September 19, 2009 Prescriptions: ADDERALL 30 MG  TABS (AMPHETAMINE-DEXTROAMPHETAMINE) 1 by mouth two times a day - to fill September 19, 2009  #60 x 0   Entered and Authorized by:   Corwin Levins MD   Signed by:   Corwin Levins MD on 09/19/2009   Method used:   Print then Give to Patient   RxID:   [id number]  done hardcopy to LIM side B - dahlia  Corwin Levins MD  September 19, 2009 1:19 PM

## 2010-03-24 NOTE — Letter (Signed)
Summary: Out of Work  [facility]  [address] [place], [place] [address]   Phone: [phone]  Fax: [phone]    December 16, 2009   Employee:  Celvin T Fjelstad    To Whom It May Concern:   For Medical reasons, please excuse the above named employee from work for the following dates:  Start:   12/07/2009  End:   12/23/2009  If you need additional information, please feel free to contact our office.         Sincerely,    Dr. Oliver Barre  Appended Document: Out of Work called patient and left msg. that out of work letter is completed and can pickup at front desk.

## 2010-04-01 NOTE — Progress Notes (Signed)
Summary: Adderall  Phone Note Call from Patient Call back at Home Phone [phone]   Caller: Patient Summary of Call: Pt called requesting refill of Adderall Initial call taken by: Margaret Pyle, CMA,  March 24, 2010 1:06 PM  Follow-up for Phone Call        Pt informed via VM, Rx in cabinet for pt pick up Follow-up by: Margaret Pyle, CMA,  March 24, 2010 2:04 PM    New/Updated Medications: ADDERALL 15 MG TABS (AMPHETAMINE-DEXTROAMPHETAMINE) 2 by mouth two times a day  - to fill Mar 24, 2009 Prescriptions: ADDERALL 15 MG TABS (AMPHETAMINE-DEXTROAMPHETAMINE) 2 by mouth two times a day  - to fill Mar 24, 2009  #120 x 0   Entered and Authorized by:   Corwin Levins MD   Signed by:   Corwin Levins MD on 03/24/2010   Method used:   Print then Give to Patient   RxID:   [id number]  done hardcopy to LIM side B - dahlia Corwin Levins MD  March 24, 2010 1:17 PM

## 2010-04-03 ENCOUNTER — Telehealth: Payer: Self-pay | Admitting: Internal Medicine

## 2010-04-09 NOTE — Progress Notes (Signed)
Summary: ADDERALL   Phone Note Call from Patient   Summary of Call: Pt brought back rx for adderall. Cost for current rx is over 100 dollars. 30mg  tabs would be around 50 - ok to change to 30mg  1 two times a day # 60?  Initial call taken by: Lamar Sprinkles, CMA,  April 03, 2010 1:59 PM  Follow-up for Phone Call        ok to change due to cost  prior rx destroyed Follow-up by: Corwin Levins MD,  April 03, 2010 2:21 PM    New/Updated Medications: ADDERALL 30 MG TABS (AMPHETAMINE-DEXTROAMPHETAMINE) 1 by mouth two times a day Prescriptions: ADDERALL 30 MG TABS (AMPHETAMINE-DEXTROAMPHETAMINE) 1 by mouth two times a day  #60 x 0   Entered and Authorized by:   Corwin Levins MD   Signed by:   Corwin Levins MD on 04/03/2010   Method used:   Print then Give to Patient   RxID:   [id number]

## 2010-04-09 NOTE — Progress Notes (Signed)
Summary: adderrall prescription incorrect per pt,wants to pcik up  Phone Note Call from Patient Call back at Home Phone [phone]   Caller: Patient--[phone] Call For: Dr Jonny Ruiz Summary of Call: Pt picked up prescription, he states dose is wrong and should be adderrall 30 mg not 15 mg. Pt borrowed a car and wants to have prescription fixed so he can fill today. He can be reached at [phone]. Initial call taken by: Verdell Face,  April 03, 2010 12:45 PM  Follow-up for Phone Call        I am sure the pt realizes his rx was changed and has been the same thing since october 2011  adderall 30 mg two times a day is the Same as adderal 15 mg - 2 tabs two times a day  no need to change rx today';  we can go back to the 30 two times a day next time  - just remind me when the time comes Follow-up by: Corwin Levins MD,  April 03, 2010 1:06 PM  Additional Follow-up for Phone Call Additional follow up Details #1::        Left detailed vm on pt's cell w/above Additional Follow-up by: Lamar Sprinkles, CMA,  April 03, 2010 1:47 PM

## 2010-05-05 ENCOUNTER — Telehealth: Payer: Self-pay | Admitting: Internal Medicine

## 2010-05-12 NOTE — Progress Notes (Signed)
Summary: Adderall  Phone Note Refill Request Message from:  Patient on May 05, 2010 2:14 PM  Refills Requested: Medication #1:  ADDERALL 30 MG TABS 1 by mouth two times a day   Dosage confirmed as above?Dosage Confirmed   Supply Requested: 1 month  Method Requested: Pick up at Office Initial call taken by: Margaret Pyle, CMA,  May 05, 2010 2:14 PM    Prescriptions: ADDERALL 30 MG TABS (AMPHETAMINE-DEXTROAMPHETAMINE) 1 by mouth two times a day  #60 x 0   Entered and Authorized by:   Corwin Levins MD   Signed by:   Corwin Levins MD on 05/05/2010   Method used:   Print then Give to Patient   RxID:   [id number]  done hardcopy to LIM side B - dahlia  Corwin Levins MD  May 05, 2010 4:37 PM   pt informed, Rx in cabinet for pt pick up\ Margaret Pyle, CMA  May 05, 2010 4:43 PM

## 2010-05-18 ENCOUNTER — Other Ambulatory Visit: Payer: Self-pay

## 2010-05-18 DIAGNOSIS — G4700 Insomnia, unspecified: Secondary | ICD-10-CM

## 2010-05-18 NOTE — Telephone Encounter (Signed)
Refill request from [facility]. For Zolpidem 10 mg, last filled  02/10/09 and last OV 12/18/09.

## 2010-05-19 MED ORDER — ZOLPIDEM TARTRATE 10 MG PO TABS: 10.0000 mg | ORAL_TABLET | Freq: Every evening | ORAL | Status: DC | PRN

## 2010-05-19 NOTE — Telephone Encounter (Signed)
Rx faxed to pharmacy  

## 2010-05-19 NOTE — Telephone Encounter (Signed)
Done hardcopy to dahlia/LIM B  

## 2010-06-10 ENCOUNTER — Other Ambulatory Visit: Payer: Self-pay

## 2010-06-10 MED ORDER — AMPHETAMINE-DEXTROAMPHETAMINE 30 MG PO TABS: 30.0000 mg | ORAL_TABLET | Freq: Two times a day (BID) | ORAL | Status: DC

## 2010-06-10 MED ORDER — CYCLOBENZAPRINE HCL 5 MG PO TABS: 5.0000 mg | ORAL_TABLET | Freq: Three times a day (TID) | ORAL | Status: AC | PRN

## 2010-06-10 NOTE — Telephone Encounter (Signed)
Pt informed, Rx in cabinet for pt pick up  

## 2010-06-10 NOTE — Telephone Encounter (Signed)
Pt called requesting refill of Adderall last written 03/13 and Flexeril last written 12/16/2009.

## 2010-07-10 NOTE — Op Note (Signed)
NAME:  Isaiah Gibson, Isaiah Gibson                            ACCOUNT NO.:  [id number]   MEDICAL RECORD NO.:  [id number]                   PATIENT TYPE:  AMB   LOCATION:  [facility]SC                                  FACILITY:  [facility]   PHYSICIAN:  Cindee Salt, M.D.                    DATE OF BIRTH:  [birth year]   DATE OF PROCEDURE:  11/21/2002  DATE OF DISCHARGE:                                 OPERATIVE REPORT   PREOPERATIVE DIAGNOSIS:  Volar transjugular fibrocartilage complex ligament  injury.   POSTOPERATIVE DIAGNOSIS:  Volar transjugular fibrocartilage complex ligament  injury with lunotriquetral ligaments.   SURGEON:  Cindee Salt, M.D.   ASSISTANTCarolyne Fiscal.   ANESTHESIA:  Axillary block.   HISTORY:  The patient is a 25 year old male with a history of an injury to  his right wrist.  He has pain on the ulnar aspect.  This does not respond to  conservative treatment.  MRI is equivocal for injury and leakage on the  ulnar side TFCC.   DESCRIPTION OF PROCEDURE:  The patient was brought to the operating room  where an axillary block was carried out without difficulty.  He was prepped  using Duraprep in the supine position with the right arm free.  The limb was  placed in the arthroscopy tower.  Ten pounds of traction were applied.  The  joint was __________ 3-4 portal.  A transverse incision was made and  deepened with hemostat.  A blunt trocar was used to enter the joint.  The  joint was inspected.  The volar radial wrist ligaments were intact.  The  cartilage was intact.  The scapholunate ligament was intact.  The volar  ulnar ligament showed some stretching and involution into the joint.  The  TFCC was stable.  An irrigation catheter was placed in 6U.  A 4-5 portal  opened after localization with a 22 gauge needle.  The portal opened on the  ulnar aspect after a probe was placed and trampoline effect was tested.  The  TFCC was found to be intact.  Moderate synovitis was present with a large  prestyloid recess.  The scope was then introduced in the 4-5 portal.  The  ulnar side was inspected.  The lunotriquetral ligament was intact.  The  involution of the volar and ulnar ligaments was immediately apparent from  the ulnar side.  The mid carpal joint was then inspected through the distal  radial and ulnar mid carpal portals, localized with a 22 gauge needle,  inflated with saline, and the scope entered in the distal 3-4 radial portal.  The STT joint showed no changes.  Moderate synovitis was present on the  dorsal aspect.  The scapholunate showed no opening.  Instability of the  lunotriquetral joint was apparent after placement of a probe in the ulnar  portion with an irrigation catheter placed more ulnarly.  A synovectomy was  performed with an ArthroWand.  The mid carpal joint had the instruments  removed.  These were introduced in the radiocarpal portals and a shrinkage  of volar ulnar ligaments were performed.  The mid carpal joint was then  inspected following debridement of the synovial tissue and shrinkage of the  volar ulnar ligaments.  Significant stability was afforded to the  lunotriquetral joint following the shrinkage.  The instruments were removed.  Portals  were closed with interrupted 5-0 nylon suture.  A sterile compressive  dressing and dorsal palmar joint were applied.  The patient tolerated the  procedure well and was taken to the recovery room for observation in  satisfactory condition.  He is discharged home to return to the [facility]  of [place] in one week on Vicodin and Keflex.                                               Cindee Salt, M.D.    Angelique Blonder  D:  11/21/2002  T:  11/21/2002  Job:  [id number]

## 2010-07-21 ENCOUNTER — Other Ambulatory Visit: Payer: Self-pay

## 2010-07-21 MED ORDER — AMPHETAMINE-DEXTROAMPHETAMINE 30 MG PO TABS: 30.0000 mg | ORAL_TABLET | Freq: Two times a day (BID) | ORAL | Status: DC

## 2010-07-21 NOTE — Telephone Encounter (Signed)
Pt informed, Rx in cabinet for pt pick up  

## 2010-08-31 ENCOUNTER — Other Ambulatory Visit: Payer: Self-pay

## 2010-08-31 MED ORDER — AMPHETAMINE-DEXTROAMPHETAMINE 30 MG PO TABS: 30.0000 mg | ORAL_TABLET | Freq: Two times a day (BID) | ORAL | Status: DC

## 2010-08-31 NOTE — Telephone Encounter (Signed)
Pt informed, Rx in cabinet for pt pick up  

## 2010-09-30 ENCOUNTER — Other Ambulatory Visit: Payer: Self-pay

## 2010-09-30 MED ORDER — AMPHETAMINE-DEXTROAMPHETAMINE 30 MG PO TABS: 30.0000 mg | ORAL_TABLET | Freq: Two times a day (BID) | ORAL | Status: DC

## 2010-09-30 NOTE — Telephone Encounter (Signed)
Pt informed, Rx in cabinet for pt pick up  

## 2010-10-29 ENCOUNTER — Other Ambulatory Visit: Payer: Self-pay

## 2010-10-29 MED ORDER — AMPHETAMINE-DEXTROAMPHETAMINE 30 MG PO TABS: 30.0000 mg | ORAL_TABLET | Freq: Two times a day (BID) | ORAL | Status: DC

## 2010-10-29 NOTE — Telephone Encounter (Signed)
Called patient to inform prescription ready for pickup at front desk.

## 2010-10-29 NOTE — Telephone Encounter (Signed)
Done hardcopy to dahlia/LIM B;  Per emr should be ok to fill on or after sept 7

## 2010-10-29 NOTE — Telephone Encounter (Signed)
Patient called requesting his adderall 30 mg refilled. He has to return to work next week and will be out of town. He is aware the refill will be picked up early, but can post date until the 12th to fill. Call back number is [phone]

## 2010-12-02 LAB — RAPID URINE DRUG SCREEN, HOSP PERFORMED
Amphetamines: POSITIVE — AB
Barbiturates: NOT DETECTED
Benzodiazepines: NOT DETECTED
Cocaine: NOT DETECTED
Opiates: NOT DETECTED
Tetrahydrocannabinol: POSITIVE — AB

## 2010-12-02 LAB — ETHANOL: Alcohol, Ethyl (B): 7

## 2010-12-08 ENCOUNTER — Other Ambulatory Visit: Payer: Self-pay

## 2010-12-09 MED ORDER — AMPHETAMINE-DEXTROAMPHETAMINE 30 MG PO TABS: 30.0000 mg | ORAL_TABLET | Freq: Two times a day (BID) | ORAL | Status: DC

## 2010-12-09 NOTE — Telephone Encounter (Signed)
Done hardcopy to dahlia/LIM B  

## 2010-12-09 NOTE — Telephone Encounter (Signed)
Pt informed, Rx in cabinet for pt pick up  

## 2011-01-01 ENCOUNTER — Encounter: Payer: Self-pay | Admitting: Endocrinology

## 2011-01-01 ENCOUNTER — Ambulatory Visit: Payer: Self-pay | Admitting: Internal Medicine

## 2011-01-01 ENCOUNTER — Ambulatory Visit (INDEPENDENT_AMBULATORY_CARE_PROVIDER_SITE_OTHER): Payer: PRIVATE HEALTH INSURANCE | Admitting: Endocrinology

## 2011-01-01 VITALS — BP 112/76 | HR 89 | Temp 97.6°F | Ht 72.0 in | Wt 252.0 lb

## 2011-01-01 DIAGNOSIS — J069 Acute upper respiratory infection, unspecified: Secondary | ICD-10-CM

## 2011-01-01 MED ORDER — AZITHROMYCIN 500 MG PO TABS: 500.0000 mg | ORAL_TABLET | Freq: Every day | ORAL | Status: DC

## 2011-01-01 NOTE — Patient Instructions (Addendum)
i have sent a prescription to your pharmacy, for an antibiotic. You are due to see dr Jonny Ruiz.  He can address your adderall then.

## 2011-01-01 NOTE — Progress Notes (Signed)
  Subjective:    Patient ID: Isaiah Gibson, male    DOB: October 13, 1985, 25 y.o.   MRN: [id number]  HPI Pt states 1 week of prod-quality cough in the chest, but no assoc earache.  Past Medical History  Diagnosis Date  . VENEREAL WART 05/15/2007  . POLYCYTHEMIA 05/20/2009  . SMOKER 05/15/2007  . ADD 10/20/2006  . HYPERTENSION 01/25/2008  . GERD 10/20/2006  . HEMATOCHEZIA 12/18/2009  . PSORIASIS 10/20/2006  . BACK PAIN 05/15/2007  . INSOMNIA-SLEEP DISORDER-UNSPEC 05/15/2007  . Palpitations 01/25/2008    No past surgical history on file.  History   Social History  . Marital Status: Single    Spouse Name: N/A    Number of Children: N/A  . Years of Education: N/A   Occupational History  . Not on file.   Social History Main Topics  . Smoking status: Current Everyday Smoker  . Smokeless tobacco: Not on file  . Alcohol Use: Not on file  . Drug Use: No  . Sexually Active: Not on file   Other Topics Concern  . Not on file   Social History Narrative  . No narrative on file    Current Outpatient Prescriptions on File Prior to Visit  Medication Sig Dispense Refill  . amphetamine-dextroamphetamine (ADDERALL) 30 MG tablet Take 1 tablet (30 mg total) by mouth 2 (two) times daily. Please make return office visit for further refills  60 tablet  0    No Known Allergies  Family History  Problem Relation Age of Onset  . Hypertension Other     Grandfather    BP 112/76  Pulse 89  Temp(Src) 97.6 F (36.4 C) (Oral)  Ht 6' (1.829 m)  Wt 252 lb (114.306 kg)  BMI 34.18 kg/m2  SpO2 95%  Review of Systems Denies fever and sob    Objective:   Physical Exam VITAL SIGNS:  See vs page GENERAL: no distress head: no deformity eyes: no periorbital swelling, no proptosis external nose and ears are normal mouth: no lesion seen, but pharynx is red Both tm's are red. NECK: There is no palpable thyroid enlargement.  No thyroid nodule is palpable.  No palpable lymphadenopathy at the anterior  neck. LUNGS:  Clear to auscultation       Assessment & Plan:  Glenford Peers, new

## 2011-01-02 ENCOUNTER — Encounter: Payer: Self-pay | Admitting: Internal Medicine

## 2011-01-02 DIAGNOSIS — Z0001 Encounter for general adult medical examination with abnormal findings: Secondary | ICD-10-CM | POA: Insufficient documentation

## 2011-01-02 DIAGNOSIS — Z0000 Encounter for general adult medical examination without abnormal findings: Secondary | ICD-10-CM | POA: Insufficient documentation

## 2011-01-04 ENCOUNTER — Encounter: Payer: Self-pay | Admitting: Internal Medicine

## 2011-01-04 ENCOUNTER — Ambulatory Visit (INDEPENDENT_AMBULATORY_CARE_PROVIDER_SITE_OTHER): Payer: PRIVATE HEALTH INSURANCE | Admitting: Internal Medicine

## 2011-01-04 VITALS — BP 102/78 | HR 92 | Temp 98.0°F | Ht 72.0 in | Wt 251.5 lb

## 2011-01-04 DIAGNOSIS — J209 Acute bronchitis, unspecified: Secondary | ICD-10-CM | POA: Insufficient documentation

## 2011-01-04 DIAGNOSIS — I10 Essential (primary) hypertension: Secondary | ICD-10-CM

## 2011-01-04 DIAGNOSIS — Z0000 Encounter for general adult medical examination without abnormal findings: Secondary | ICD-10-CM

## 2011-01-04 DIAGNOSIS — G4700 Insomnia, unspecified: Secondary | ICD-10-CM

## 2011-01-04 DIAGNOSIS — F988 Other specified behavioral and emotional disorders with onset usually occurring in childhood and adolescence: Secondary | ICD-10-CM

## 2011-01-04 DIAGNOSIS — M549 Dorsalgia, unspecified: Secondary | ICD-10-CM

## 2011-01-04 MED ORDER — AMPHETAMINE-DEXTROAMPHETAMINE 30 MG PO TABS: 30.0000 mg | ORAL_TABLET | Freq: Two times a day (BID) | ORAL | Status: DC

## 2011-01-04 MED ORDER — LEVOFLOXACIN 250 MG PO TABS: 250.0000 mg | ORAL_TABLET | Freq: Every day | ORAL | Status: AC

## 2011-01-04 MED ORDER — ZOLPIDEM TARTRATE 10 MG PO TABS: 10.0000 mg | ORAL_TABLET | Freq: Every evening | ORAL | Status: DC | PRN

## 2011-01-04 NOTE — Assessment & Plan Note (Signed)
stable overall by hx and exam, most recent data reviewed with pt, and pt to continue medical treatment as before  BP Readings from Last 3 Encounters:  01/04/11 102/78  01/01/11 112/76  12/18/09 102/64

## 2011-01-04 NOTE — Assessment & Plan Note (Signed)
stable overall by hx and exam, and pt to continue medical treatment as before 

## 2011-01-04 NOTE — Assessment & Plan Note (Signed)
stable overall by hx and exam, most recent data reviewed with pt, and pt to continue medical treatment as before. For med refills  Lab Results  Component Value Date   WBC 10.2 12/18/2009   HGB 17.2* 12/18/2009   HCT 48.0 12/18/2009   PLT 322.0 12/18/2009   GLUCOSE 79 12/18/2009   ALT 37 12/18/2009   AST 24 12/18/2009   NA 131* 12/18/2009   K 4.1 12/18/2009   CL 95* 12/18/2009   CREATININE 0.8 12/18/2009   BUN 16 12/18/2009   CO2 32 12/18/2009   TSH 1.83 05/31/2008

## 2011-01-04 NOTE — Patient Instructions (Signed)
Take all new medications as prescribed. Continue all other medications as before Please return in 6 mo with Lab testing done 3-5 days before  

## 2011-01-04 NOTE — Progress Notes (Signed)
Subjective:    Patient ID: Isaiah Gibson, male    DOB: March 03, 1985, 25 y.o.   MRN: [id number]  HPI  Here to f/u - overall doing ok.,  Pt denies chest pain, increased sob or doe, wheezing, orthopnea, PND, increased LE swelling, palpitations, dizziness or syncope.Pt denies new neurological symptoms such as new headache, or facial or extremity weakness or numbness  Pt denies polydipsia, polyuria.  ADD meds seems to work better some days than others, but still working ok as far as work and socially.   Psoriasis acting up today since he has been sick, and essentialy not improved sinus symptoms  -  Here with 7 days acute onset fever, facial pain, pressure, general weakness and malaise, and greenish d/c, with slight ST, but little to no cough and Pt denies chest pain, increased sob or doe, wheezing, orthopnea, PND, increased LE swelling, palpitations, dizziness or syncope. Pt continues to have recurring LBP without change in severity, bowel or bladder change, fever, wt loss,  worsening LE pain/numbness/weakness, gait change or falls, and bilat leg pain seems worse after working/standing all day at night - but better with the flexeril/ultram.  Still with significant insomnia with getting to sleep.  Denies worsening depressive symptoms, suicidal ideation, or panic, though has ongoing anxiety, not increased recently.  Past Medical History  Diagnosis Date  . VENEREAL WART 05/15/2007  . POLYCYTHEMIA 05/20/2009  . SMOKER 05/15/2007  . ADD 10/20/2006  . HYPERTENSION 01/25/2008  . GERD 10/20/2006  . HEMATOCHEZIA 12/18/2009  . PSORIASIS 10/20/2006  . BACK PAIN 05/15/2007  . INSOMNIA-SLEEP DISORDER-UNSPEC 05/15/2007  . Palpitations 01/25/2008   No past surgical history on file.  reports that he has been smoking.  He does not have any smokeless tobacco history on file. He reports that he does not use illicit drugs. His alcohol history not on file. family history includes Hypertension in his other. No Known  Allergies Current Outpatient Prescriptions on File Prior to Visit  Medication Sig Dispense Refill  . amphetamine-dextroamphetamine (ADDERALL) 30 MG tablet Take 1 tablet (30 mg total) by mouth 2 (two) times daily. Please make return office visit for further refills  60 tablet  0  . azithromycin (ZITHROMAX) 500 MG tablet Take 1 tablet (500 mg total) by mouth daily.  5 tablet  0  . zolpidem (AMBIEN) 10 MG tablet Take 10 mg by mouth at bedtime as needed.        . cyclobenzaprine (FLEXERIL) 5 MG tablet Take 5 mg by mouth 3 (three) times daily as needed.        [name] HYDROcodone-acetaminophen (NORCO) 5-325 MG per tablet Take 1 tablet by mouth every 6 (six) hours as needed.        . traMADol (ULTRAM) 50 MG tablet 1-2 by mouth  Four times daily as needed        Review of Systems Review of Systems  Constitutional: Negative for diaphoresis and unexpected weight change.  HENT: Negative for drooling and tinnitus.   Eyes: Negative for photophobia and visual disturbance.  Respiratory: Negative for choking and stridor.   Gastrointestinal: Negative for vomiting and blood in stool.  Genitourinary: Negative for hematuria and decreased urine volume.  Musculoskeletal: Negative for gait problem.        Objective:   Physical Exam BP 102/78  Pulse 92  Temp(Src) 98 F (36.7 C) (Oral)  Ht 6' (1.829 m)  Wt 251 lb 8 oz (114.08 kg)  BMI 34.11 kg/m2  SpO2 95% Physical Exam  VS  noted, has gained several lbs, mild ill Constitutional: Pt appears well-developed and well-nourished.  HENT: Head: Normocephalic.  Right Ear: External ear normal.  Left Ear: External ear normal.  Bilat tm's mild erythema.  Sinus nontender.  Pharynx mild erythema Eyes: Conjunctivae and EOM are normal. Pupils are equal, round, and reactive to light.  Neck: Normal range of motion. Neck supple.  Cardiovascular: Normal rate and regular rhythm.   Pulmonary/Chest: Effort normal and breath sounds normal.  Abd:  Soft, NT, non-distended, +  BS Neurological: Pt is alert. No cranial nerve deficit.  Skin: Skin is warm. No erythema. except for large psoriatic plaque to right elbow area Spine nontender, thigh nontender, no sweling or erythema, rash;   Psychiatric: Pt behavior is normal. Thought content normal.     Assessment & Plan:

## 2011-01-04 NOTE — Assessment & Plan Note (Signed)
stable overall by hx and exam, most recent data reviewed with pt, and pt to continue medical treatment as before Lab Results  Component Value Date   WBC 10.2 12/18/2009   HGB 17.2* 12/18/2009   HCT 48.0 12/18/2009   PLT 322.0 12/18/2009   GLUCOSE 79 12/18/2009   ALT 37 12/18/2009   AST 24 12/18/2009   NA 131* 12/18/2009   K 4.1 12/18/2009   CL 95* 12/18/2009   CREATININE 0.8 12/18/2009   BUN 16 12/18/2009   CO2 32 12/18/2009   TSH 1.83 05/31/2008

## 2011-01-04 NOTE — Assessment & Plan Note (Signed)
Mild to mod, for antibx course,  to f/u any worsening symptoms or concerns, 

## 2011-01-25 ENCOUNTER — Ambulatory Visit (INDEPENDENT_AMBULATORY_CARE_PROVIDER_SITE_OTHER): Payer: PRIVATE HEALTH INSURANCE | Admitting: Internal Medicine

## 2011-01-25 ENCOUNTER — Encounter: Payer: Self-pay | Admitting: Internal Medicine

## 2011-01-25 ENCOUNTER — Other Ambulatory Visit (INDEPENDENT_AMBULATORY_CARE_PROVIDER_SITE_OTHER): Payer: PRIVATE HEALTH INSURANCE

## 2011-01-25 ENCOUNTER — Telehealth: Payer: Self-pay

## 2011-01-25 VITALS — BP 102/82 | HR 96 | Temp 98.0°F | Ht 72.0 in | Wt 252.1 lb

## 2011-01-25 DIAGNOSIS — R609 Edema, unspecified: Secondary | ICD-10-CM

## 2011-01-25 DIAGNOSIS — R002 Palpitations: Secondary | ICD-10-CM

## 2011-01-25 DIAGNOSIS — R42 Dizziness and giddiness: Secondary | ICD-10-CM

## 2011-01-25 DIAGNOSIS — R350 Frequency of micturition: Secondary | ICD-10-CM | POA: Insufficient documentation

## 2011-01-25 DIAGNOSIS — R600 Localized edema: Secondary | ICD-10-CM | POA: Insufficient documentation

## 2011-01-25 LAB — URINALYSIS, ROUTINE W REFLEX MICROSCOPIC
Bilirubin Urine: NEGATIVE
Hgb urine dipstick: NEGATIVE
Ketones, ur: NEGATIVE
Nitrite: NEGATIVE
Specific Gravity, Urine: 1.025 (ref 1.000–1.030)
Urine Glucose: NEGATIVE

## 2011-01-25 LAB — CBC WITH DIFFERENTIAL/PLATELET
Basophils Relative: 0.3 % (ref 0.0–3.0)
Eosinophils Absolute: 0.4 10*3/uL (ref 0.0–0.7)
Lymphs Abs: 2.6 10*3/uL (ref 0.7–4.0)
MCHC: 35.3 g/dL (ref 30.0–36.0)
MCV: 91.4 fl (ref 78.0–100.0)
Monocytes Absolute: 0.7 10*3/uL (ref 0.1–1.0)
Neutrophils Relative %: 66.7 % (ref 43.0–77.0)
Platelets: 329 10*3/uL (ref 150.0–400.0)

## 2011-01-25 NOTE — Progress Notes (Signed)
Subjective:    Patient ID: Isaiah Gibson, male    DOB: Apr 30, 1985, 25 y.o.   MRN: [id number]  HPI  Here to f/u -  C/o transient bilat LE edema to mid calf about the start 3rd day of 1 wk sailing trip last wk (just returned 2 days ago), salt was in all food (he normally doesn't do that).  Drank ETOH slowly but steadily for 4 days  But stopped with onset swelling. - total about 3 L ETOH overall in this time.  Edema impoved to elevation and massage over the next few days and by today essentially resolved.  At end of the wk of sailing also felt some fluttering in the chest assoc with dizziness that lasted seconds only but occurred 6 times, the last being this am, lasting seconds only..  Pt denies chest pain, increased sob or doe, wheezing, orthopnea, PND, increased LE swelling, palpitations, dizziness or syncope except for the above, and in fact has some dizziness now.  Normally drinks one cup coffee per day, but with unsuual urinary freq today (new onset today) with some urgency but without dysuria, fever, back pain, n/v.  Pt denies new neurological symptoms such as new headache, or facial or extremity weakness or numbness   Pt denies polydipsia, polyuria.   Past Medical History  Diagnosis Date  . VENEREAL WART 05/15/2007  . POLYCYTHEMIA 05/20/2009  . SMOKER 05/15/2007  . ADD 10/20/2006  . HYPERTENSION 01/25/2008  . GERD 10/20/2006  . HEMATOCHEZIA 12/18/2009  . PSORIASIS 10/20/2006  . BACK PAIN 05/15/2007  . INSOMNIA-SLEEP DISORDER-UNSPEC 05/15/2007  . Palpitations 01/25/2008   No past surgical history on file.  reports that he has been smoking.  He does not have any smokeless tobacco history on file. He reports that he does not use illicit drugs. His alcohol history not on file. family history includes Hypertension in his other. No Known Allergies Current Outpatient Prescriptions on File Prior to Visit  Medication Sig Dispense Refill  . amphetamine-dextroamphetamine (ADDERALL) 30 MG tablet Take 1  tablet (30 mg total) by mouth 2 (two) times daily. To fill Mar 08, 2011  60 tablet  0  . HYDROcodone-acetaminophen (NORCO) 5-325 MG per tablet Take 1 tablet by mouth every 6 (six) hours as needed.        . traMADol (ULTRAM) 50 MG tablet 1-2 by mouth  Four times daily as needed       . zolpidem (AMBIEN) 10 MG tablet Take 1 tablet (10 mg total) by mouth at bedtime as needed.  30 tablet  5   Review of Systems Review of Systems  Constitutional: Negative for diaphoresis and unexpected weight change.  HENT: Negative for drooling and tinnitus.   Eyes: Negative for photophobia and visual disturbance.  Respiratory: Negative for choking and stridor.   Gastrointestinal: Negative for vomiting and blood in stool.  Genitourinary: Negative for hematuria and decreased urine volume.  Musculoskeletal: Negative for gait problem.    Objective:   Physical Exam BP 102/82  Pulse 96  Temp(Src) 98 F (36.7 C) (Oral)  Ht 6' (1.829 m)  Wt 252 lb 2 oz (114.363 kg)  BMI 34.19 kg/m2  SpO2 97% Physical Exam  VS noted, not ill appaering Constitutional: Pt appears well-developed and well-nourished.  HENT: Head: Normocephalic.  Right Ear: External ear normal.  Left Ear: External ear normal.  Eyes: Conjunctivae and EOM are normal. Pupils are equal, round, and reactive to light.  Neck: Normal range of motion. Neck supple.  Cardiovascular: Normal  rate and regular rhythm.   Pulmonary/Chest: Effort normal and breath sounds normal.  Abd:  Soft, NT, non-distended, + BS Neurological: Pt is alert. No cranial nerve deficit.  Skin: Skin is warm. No erythema.  No LE edema Psychiatric: Pt behavior is normal. Thought content normal.1+ nervous     Assessment & Plan:

## 2011-01-25 NOTE — Patient Instructions (Addendum)
Your EKG was ok today Your Blood pressure on standing today did show some mild dehydration;  Please drink more fluids over the next few days such as gatorade Your examination was otherwise ok as well, but Please go to LAB in the Basement for the blood and/or urine tests to be done today Please call the phone number [phone] (the [employer]Tree System) for results of testing in 2-3 days;  When calling, simply dial the number, and when prompted enter the MRN number above (the Medical Record Number) and the # key, then the message should start. Continue all other medications as before You will be contacted regarding the referral for: Echocardiogram (ultrasound of the heart)

## 2011-01-25 NOTE — Assessment & Plan Note (Addendum)
Orthostatics noted today - c/w mild volume depletion, ECG reviewed as per emr;  Etiology unclear, for labs., cxr and echo for further eval, consider holter for recurrent palps as well

## 2011-01-25 NOTE — Assessment & Plan Note (Signed)
Somewhat suggestive of polyuria, but glc normal oct 2011;  To check ua, suspect my be related to ETOH and/or caffeine overuse

## 2011-01-25 NOTE — Telephone Encounter (Signed)
Should be ok, will see at scheduled appt

## 2011-01-25 NOTE — Assessment & Plan Note (Signed)
None at this time, hold on any diuretic, unclear etiology except for ? More salt in diet,  to f/u any worsening symptoms or concerns

## 2011-01-25 NOTE — Telephone Encounter (Signed)
Pt called stating after going out of the country for vacation he notices that on the 3 day spent on a boat his feet had swollen to 2- times their normal size. He says that at the airport returning home he received a massage to his feet that helped with the swelling but he soon felt "fluttering" in his chest while on the airplane and again this morning. Pt says that it does not last long but is accompanied by a HA. Pt is schedule today at 3p but he is concerned about sxs and wait. Please advise

## 2011-01-25 NOTE — Assessment & Plan Note (Signed)
Etiology unclear, consider holter monitor, but to check other eval for now as above

## 2011-01-26 LAB — TSH: TSH: 2.05 u[IU]/mL (ref 0.35–5.50)

## 2011-01-26 LAB — HEPATIC FUNCTION PANEL
ALT: 43 U/L (ref 0–53)
AST: 32 U/L (ref 0–37)
Bilirubin, Direct: 0 mg/dL (ref 0.0–0.3)
Total Bilirubin: 0.3 mg/dL (ref 0.3–1.2)

## 2011-01-26 LAB — BASIC METABOLIC PANEL
Chloride: 104 mEq/L (ref 96–112)
Potassium: 3.6 mEq/L (ref 3.5–5.1)
Sodium: 140 mEq/L (ref 135–145)

## 2011-02-02 ENCOUNTER — Other Ambulatory Visit (HOSPITAL_COMMUNITY): Payer: PRIVATE HEALTH INSURANCE

## 2011-02-12 ENCOUNTER — Ambulatory Visit (HOSPITAL_COMMUNITY): Payer: PRIVATE HEALTH INSURANCE | Attending: Cardiovascular Disease

## 2011-02-12 DIAGNOSIS — F17200 Nicotine dependence, unspecified, uncomplicated: Secondary | ICD-10-CM | POA: Insufficient documentation

## 2011-02-12 DIAGNOSIS — I10 Essential (primary) hypertension: Secondary | ICD-10-CM | POA: Insufficient documentation

## 2011-02-12 DIAGNOSIS — R609 Edema, unspecified: Secondary | ICD-10-CM | POA: Insufficient documentation

## 2011-02-12 DIAGNOSIS — R42 Dizziness and giddiness: Secondary | ICD-10-CM

## 2011-02-12 DIAGNOSIS — I079 Rheumatic tricuspid valve disease, unspecified: Secondary | ICD-10-CM | POA: Insufficient documentation

## 2011-02-12 DIAGNOSIS — R002 Palpitations: Secondary | ICD-10-CM

## 2011-03-10 ENCOUNTER — Other Ambulatory Visit: Payer: Self-pay | Admitting: *Deleted

## 2011-03-10 NOTE — Telephone Encounter (Signed)
Has already been given rx dated for jan 14 - should use that

## 2011-03-10 NOTE — Telephone Encounter (Signed)
Adderall 30 mg request [last refill written 12.21.12 & per pharmacy filled on 12.24.12 #[id number]] Please advise.

## 2011-03-11 NOTE — Telephone Encounter (Signed)
Patient informed. 

## 2011-05-10 ENCOUNTER — Other Ambulatory Visit: Payer: Self-pay

## 2011-05-10 MED ORDER — AMPHETAMINE-DEXTROAMPHETAMINE 30 MG PO TABS: 30.0000 mg | ORAL_TABLET | Freq: Two times a day (BID) | ORAL | Status: DC

## 2011-05-10 NOTE — Telephone Encounter (Signed)
Done hardcopy to robin  

## 2011-05-11 NOTE — Telephone Encounter (Signed)
Called the patient left message that prescription is ready for pickup at the front desk.

## 2011-06-08 ENCOUNTER — Other Ambulatory Visit: Payer: Self-pay

## 2011-06-08 MED ORDER — AMPHETAMINE-DEXTROAMPHETAMINE 30 MG PO TABS: 30.0000 mg | ORAL_TABLET | Freq: Two times a day (BID) | ORAL | Status: DC

## 2011-06-08 NOTE — Telephone Encounter (Signed)
Called the patient informed prescription requested is ready for pickup 

## 2011-06-08 NOTE — Telephone Encounter (Signed)
Done hardcopy to robin  

## 2011-06-24 ENCOUNTER — Ambulatory Visit: Payer: PRIVATE HEALTH INSURANCE | Admitting: Internal Medicine

## 2011-06-24 DIAGNOSIS — Z0289 Encounter for other administrative examinations: Secondary | ICD-10-CM

## 2011-07-14 ENCOUNTER — Other Ambulatory Visit: Payer: Self-pay

## 2011-07-14 NOTE — Telephone Encounter (Signed)
Please advise on this Dr John pt, thanks! 

## 2011-07-14 NOTE — Telephone Encounter (Signed)
Called the patient informed to schedule appointment to refill medication.  The patient stated he does not have insurance and cannot afford to do that.  The patient stated also he was informed that as long as he has been seen within the year he could get a refill.  The patient stated he would call back on 07/20/11 once PCP is back and request refill once again.

## 2011-07-21 ENCOUNTER — Other Ambulatory Visit: Payer: Self-pay

## 2011-07-21 MED ORDER — AMPHETAMINE-DEXTROAMPHETAMINE 30 MG PO TABS: 30.0000 mg | ORAL_TABLET | Freq: Two times a day (BID) | ORAL | Status: DC

## 2011-07-21 NOTE — Telephone Encounter (Signed)
Done hardcopy to robin  

## 2011-07-22 NOTE — Telephone Encounter (Signed)
Called the patient left detailed message that prescription requested is ready for pickup at the front desk. 

## 2011-08-27 ENCOUNTER — Telehealth: Payer: Self-pay | Admitting: *Deleted

## 2011-08-27 NOTE — Telephone Encounter (Signed)
REQUEST REFILL ON MEDICATION    ADDERALL 30 MG

## 2011-08-30 MED ORDER — AMPHETAMINE-DEXTROAMPHETAMINE 30 MG PO TABS: 30.0000 mg | ORAL_TABLET | Freq: Two times a day (BID) | ORAL | Status: DC

## 2011-08-30 NOTE — Telephone Encounter (Signed)
Patient informed prescription requested is ready for pickup at the front desk. 

## 2011-08-30 NOTE — Telephone Encounter (Signed)
Done hardcopy to robin  

## 2011-09-27 ENCOUNTER — Other Ambulatory Visit: Payer: Self-pay

## 2011-09-27 MED ORDER — AMPHETAMINE-DEXTROAMPHETAMINE 30 MG PO TABS: 30.0000 mg | ORAL_TABLET | Freq: Two times a day (BID) | ORAL | Status: DC

## 2011-09-27 NOTE — Telephone Encounter (Signed)
Done hardcopy to robin  

## 2011-09-27 NOTE — Telephone Encounter (Signed)
Patient informed to pickup requested prescription at the front desk.

## 2011-10-27 ENCOUNTER — Other Ambulatory Visit: Payer: Self-pay

## 2011-10-27 MED ORDER — AMPHETAMINE-DEXTROAMPHETAMINE 30 MG PO TABS: 30.0000 mg | ORAL_TABLET | Freq: Two times a day (BID) | ORAL | Status: DC

## 2011-10-27 NOTE — Telephone Encounter (Signed)
Called the patient left message that prescription requested is ready for pickup at the front desk. 

## 2011-10-27 NOTE — Telephone Encounter (Signed)
Done hardcopy to robin  
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
## 2011-11-25 ENCOUNTER — Other Ambulatory Visit: Payer: Self-pay

## 2011-11-25 MED ORDER — AMPHETAMINE-DEXTROAMPHETAMINE 30 MG PO TABS: 30.0000 mg | ORAL_TABLET | Freq: Two times a day (BID) | ORAL | Status: DC

## 2011-11-25 NOTE — Telephone Encounter (Signed)
Done hardcopy to robin  

## 2011-11-25 NOTE — Telephone Encounter (Signed)
Called the patient informed to pickup hardcopy of prescription at the front desk. 

## 2011-12-28 ENCOUNTER — Other Ambulatory Visit: Payer: Self-pay

## 2011-12-28 MED ORDER — AMPHETAMINE-DEXTROAMPHETAMINE 30 MG PO TABS: 30.0000 mg | ORAL_TABLET | Freq: Two times a day (BID) | ORAL | Status: DC

## 2011-12-28 NOTE — Telephone Encounter (Signed)
Done hardcopy to robin  

## 2011-12-28 NOTE — Telephone Encounter (Signed)
Called the patient left detailed message to pickup hardcopy at the front desk. 

## 2012-02-01 ENCOUNTER — Other Ambulatory Visit: Payer: Self-pay | Admitting: *Deleted

## 2012-02-01 MED ORDER — AMPHETAMINE-DEXTROAMPHETAMINE 30 MG PO TABS: 30.0000 mg | ORAL_TABLET | Freq: Two times a day (BID) | ORAL | Status: DC

## 2012-02-01 NOTE — Telephone Encounter (Signed)
Done hardcopy to robin  

## 2012-02-01 NOTE — Telephone Encounter (Signed)
Lvm letting pt know his rx for adderall is ready to be picked up.

## 2012-02-01 NOTE — Telephone Encounter (Signed)
Pt requesting refill of Adderall-last written 12/28/2011 #[id number] with 0 refills-please advise.

## 2012-03-01 ENCOUNTER — Telehealth: Payer: Self-pay | Admitting: Internal Medicine

## 2012-03-01 MED ORDER — AMPHETAMINE-DEXTROAMPHETAMINE 30 MG PO TABS: 30.0000 mg | ORAL_TABLET | Freq: Two times a day (BID) | ORAL | Status: DC

## 2012-03-01 NOTE — Telephone Encounter (Signed)
Patient requesting a refill on Adderall, call patient when ready for pick up

## 2012-03-01 NOTE — Telephone Encounter (Signed)
Done hardcopy to robin  Last seen dec 2012 - due for ROV

## 2012-03-01 NOTE — Telephone Encounter (Signed)
Called the patient left detailed message to pickup hardcopy and to scheduled ROV>

## 2012-04-11 ENCOUNTER — Ambulatory Visit (INDEPENDENT_AMBULATORY_CARE_PROVIDER_SITE_OTHER): Payer: BC Managed Care – PPO | Admitting: Internal Medicine

## 2012-04-11 ENCOUNTER — Encounter: Payer: Self-pay | Admitting: Internal Medicine

## 2012-04-11 VITALS — BP 108/64 | HR 98 | Temp 97.6°F | Ht 72.0 in | Wt 249.2 lb

## 2012-04-11 DIAGNOSIS — F988 Other specified behavioral and emotional disorders with onset usually occurring in childhood and adolescence: Secondary | ICD-10-CM

## 2012-04-11 DIAGNOSIS — Z789 Other specified health status: Secondary | ICD-10-CM

## 2012-04-11 DIAGNOSIS — Z0000 Encounter for general adult medical examination without abnormal findings: Secondary | ICD-10-CM

## 2012-04-11 DIAGNOSIS — Z7289 Other problems related to lifestyle: Secondary | ICD-10-CM

## 2012-04-11 MED ORDER — AMPHETAMINE-DEXTROAMPHETAMINE 30 MG PO TABS: 30.0000 mg | ORAL_TABLET | Freq: Two times a day (BID) | ORAL | Status: DC

## 2012-04-11 NOTE — Patient Instructions (Addendum)
Please continue all other medications as before, and refills have been done if requested. We will ask for your recent lab work from Dr Terri Piedra Please stop smoking Please continue your efforts at being more active, low cholesterol diet, and weight control. Please drink moderately if at all Thank you for enrolling in MyChart. Please follow the instructions below to securely access your online medical record. MyChart allows you to send messages to your doctor, view your test results, renew your prescriptions, schedule appointments, and more. To Log into My Chart online, please go by [employer] or [employer] to [email or web address], or download the MyChart App from the [employer] of [employer] .  Your Username is: [email or web address] (pass [email or web address]!) Please send a practice Message on Mychart later today. Please return in 1 year for your yearly visit, or sooner if needed, with Lab testing done 3-5 days before

## 2012-04-11 NOTE — Progress Notes (Signed)
Subjective:    Patient ID: Isaiah Gibson, male    DOB: 1985/09/26, 27 y.o.   MRN: [id number]  HPI  Here for wellness and f/u;  Overall doing ok;  Pt denies CP, worsening SOB, DOE, wheezing, orthopnea, PND, worsening LE edema, palpitations, dizziness or syncope.  Pt denies neurological change such as new headache, facial or extremity weakness.  Pt denies polydipsia, polyuria, or low sugar symptoms. Pt states overall good compliance with treatment and medications, good tolerability, and has been trying to follow lower cholesterol diet.  Pt denies worsening depressive symptoms, suicidal ideation or panic. No fever, night sweats, wt loss, loss of appetite, or other constitutional symptoms.  Pt states good ability with ADL's, has low fall risk, home safety reviewed and adequate, no other significant changes in hearing or vision, and only occasionally active with exercise.  Adderall working well with good function at work and home.   Drinking about 2 drinks most day, slightly more on the weekends Past Medical History  Diagnosis Date  . VENEREAL WART 05/15/2007  . POLYCYTHEMIA 05/20/2009  . SMOKER 05/15/2007  . ADD 10/20/2006  . HYPERTENSION 01/25/2008  . GERD 10/20/2006  . HEMATOCHEZIA 12/18/2009  . PSORIASIS 10/20/2006  . BACK PAIN 05/15/2007  . INSOMNIA-SLEEP DISORDER-UNSPEC 05/15/2007  . Palpitations 01/25/2008   No past surgical history on file.  reports that he has been smoking.  He does not have any smokeless tobacco history on file. He reports that he does not use illicit drugs. His alcohol history is not on file. family history includes Hypertension in his other. No Known Allergies Current Outpatient Prescriptions on File Prior to Visit  Medication Sig Dispense Refill  . HYDROcodone-acetaminophen (NORCO) 5-325 MG per tablet Take 1 tablet by mouth every 6 (six) hours as needed.        . traMADol (ULTRAM) 50 MG tablet 1-2 by mouth  Four times daily as needed       . zolpidem (AMBIEN) 10 MG tablet  Take 1 tablet (10 mg total) by mouth at bedtime as needed for sleep.  30 tablet  3  . zolpidem (AMBIEN) 10 MG tablet Take 1 tablet (10 mg total) by mouth at bedtime as needed.  30 tablet  5   No current facility-administered medications on file prior to visit.    Review of Systems Constitutional: Negative for diaphoresis, activity change, appetite change or unexpected weight change.  HENT: Negative for hearing loss, ear pain, facial swelling, mouth sores and neck stiffness.   Eyes: Negative for pain, redness and visual disturbance.  Respiratory: Negative for shortness of breath and wheezing.   Cardiovascular: Negative for chest pain and palpitations.  Gastrointestinal: Negative for diarrhea, blood in stool, abdominal distention or other pain Genitourinary: Negative for hematuria, flank pain or change in urine volume.  Musculoskeletal: Negative for myalgias and joint swelling.  Skin: Negative for color change and wound.  Neurological: Negative for syncope and numbness. other than noted Hematological: Negative for adenopathy.  Psychiatric/Behavioral: Negative for hallucinations, self-injury, decreased concentration and agitation.      Objective:   Physical Exam BP 108/64  Pulse 98  Temp(Src) 97.6 F (36.4 C) (Oral)  Ht 6' (1.829 m)  Wt 249 lb 4 oz (113.059 kg)  BMI 33.8 kg/m2  SpO2 96% VS noted,  Constitutional: Pt is oriented to person, place, and time. Appears well-developed and well-nourished.  Head: Normocephalic and atraumatic.  Right Ear: External ear normal.  Left Ear: External ear normal.  Nose: Nose normal.  Mouth/Throat: Oropharynx is clear and moist.  Eyes: Conjunctivae and EOM are normal. Pupils are equal, round, and reactive to light.  Neck: Normal range of motion. Neck supple. No JVD present. No tracheal deviation present.  Cardiovascular: Normal rate, regular rhythm, normal heart sounds and intact distal pulses.   Pulmonary/Chest: Effort normal and breath sounds  normal.  Abdominal: Soft. Bowel sounds are normal. There is no tenderness. No HSM  Musculoskeletal: Normal range of motion. Exhibits no edema.  Lymphadenopathy:  Has no cervical adenopathy.  Neurological: Pt is alert and oriented to person, place, and time. Pt has normal reflexes. No cranial nerve deficit.  Skin: Skin is warm and dry. No rash noted.  Psychiatric:  Has  normal mood and affect. Behavior is normal.     Assessment & Plan:

## 2012-04-16 NOTE — Assessment & Plan Note (Signed)

## 2012-04-16 NOTE — Assessment & Plan Note (Signed)
I suspect borderline abuse that could lead to dependence, advised to d/c ETOH

## 2012-04-16 NOTE — Assessment & Plan Note (Signed)
stable overall by history and exam,  and pt to continue medical treatment as before,  to f/u any worsening symptoms or concerns, for med refill 

## 2012-05-09 ENCOUNTER — Other Ambulatory Visit: Payer: Self-pay

## 2012-05-09 MED ORDER — AMPHETAMINE-DEXTROAMPHETAMINE 30 MG PO TABS: 30.0000 mg | ORAL_TABLET | Freq: Two times a day (BID) | ORAL | Status: DC

## 2012-05-09 NOTE — Telephone Encounter (Signed)
Called the patient informed to pickup hardcopy at the front desk. 

## 2012-05-09 NOTE — Telephone Encounter (Signed)
Done hardcopy to robin  

## 2012-07-04 ENCOUNTER — Telehealth: Payer: Self-pay | Admitting: Internal Medicine

## 2012-07-04 MED ORDER — AMPHETAMINE-DEXTROAMPHETAMINE 30 MG PO TABS: 30.0000 mg | ORAL_TABLET | Freq: Two times a day (BID) | ORAL | Status: DC

## 2012-07-04 NOTE — Telephone Encounter (Signed)
Done hardcopy to robin  

## 2012-07-04 NOTE — Telephone Encounter (Signed)
Pt req refill for Adderall. Please advise.  °

## 2012-07-05 NOTE — Telephone Encounter (Signed)
Called the patient left detailed message that requested prescription is ready for pickup at the front desk.

## 2012-08-29 ENCOUNTER — Telehealth: Payer: Self-pay | Admitting: *Deleted

## 2012-08-29 MED ORDER — AMPHETAMINE-DEXTROAMPHETAMINE 30 MG PO TABS: 30.0000 mg | ORAL_TABLET | Freq: Two times a day (BID) | ORAL | Status: DC

## 2012-08-29 NOTE — Telephone Encounter (Signed)
Done hardcopy to robin  

## 2012-08-29 NOTE — Telephone Encounter (Signed)
Pt called requesting a refill on Adderall.  Please advise

## 2012-08-29 NOTE — Telephone Encounter (Signed)
Called the patient left message hardcopy for medication is ready for pickup at the front desk.

## 2012-10-10 ENCOUNTER — Telehealth: Payer: Self-pay | Admitting: *Deleted

## 2012-10-10 MED ORDER — AMPHETAMINE-DEXTROAMPHETAMINE 30 MG PO TABS: 30.0000 mg | ORAL_TABLET | Freq: Two times a day (BID) | ORAL | Status: DC

## 2012-10-10 NOTE — Telephone Encounter (Signed)
Pt called requesting an Adderall refill.  Last OV 2.18.14.  Please advise

## 2012-10-10 NOTE — Telephone Encounter (Signed)
Done hardcopy to robin  

## 2012-10-10 NOTE — Telephone Encounter (Signed)
Called the patient informed to pickup hardcopy at the front desk. 

## 2012-10-31 ENCOUNTER — Telehealth: Payer: Self-pay

## 2012-10-31 ENCOUNTER — Other Ambulatory Visit: Payer: Self-pay

## 2012-10-31 MED ORDER — CYCLOBENZAPRINE HCL 5 MG PO TABS: 5.0000 mg | ORAL_TABLET | Freq: Three times a day (TID) | ORAL | Status: DC | PRN

## 2012-10-31 MED ORDER — TRAMADOL HCL 50 MG PO TABS: 50.0000 mg | ORAL_TABLET | Freq: Four times a day (QID) | ORAL | Status: DC | PRN

## 2012-10-31 MED ORDER — AMPHETAMINE-DEXTROAMPHETAMINE 30 MG PO TABS: 30.0000 mg | ORAL_TABLET | Freq: Two times a day (BID) | ORAL | Status: DC

## 2012-10-31 NOTE — Telephone Encounter (Signed)
Called the patient informed to pickup hardcopy of both tramadol and adderall at the front desk.

## 2012-10-31 NOTE — Telephone Encounter (Signed)
Done hardcopy to robin - the adderall and tramadol only  Hold on vicodin as should use tramadol instead

## 2012-10-31 NOTE — Telephone Encounter (Signed)
Ok , flexeril done erx

## 2012-10-31 NOTE — Telephone Encounter (Signed)
Patient informed. 

## 2012-10-31 NOTE — Telephone Encounter (Signed)
The patient did pickup his tramadol and adderall refills for today and called back to inform he did not want the pain medication, but a muscle relaxer prescribed previously.  [facility].

## 2012-12-28 ENCOUNTER — Other Ambulatory Visit: Payer: Self-pay

## 2013-02-27 ENCOUNTER — Telehealth: Payer: Self-pay | Admitting: *Deleted

## 2013-02-27 MED ORDER — AMPHETAMINE-DEXTROAMPHETAMINE 30 MG PO TABS: 30.0000 mg | ORAL_TABLET | Freq: Two times a day (BID) | ORAL | Status: DC

## 2013-02-27 NOTE — Telephone Encounter (Signed)
Patient phoned requesting his adderrall scripts be refilled for 3 months.  Please advise.   CB# [phone]

## 2013-02-27 NOTE — Telephone Encounter (Signed)
Done hardcopy to robin  [name]/staff to let pt know - needs OV for further refills, last OV feb 2014

## 2013-02-28 NOTE — Telephone Encounter (Signed)
Spoke with pt Rx ready

## 2013-07-12 ENCOUNTER — Encounter (HOSPITAL_COMMUNITY): Payer: Self-pay | Admitting: Emergency Medicine

## 2013-07-12 ENCOUNTER — Emergency Department (HOSPITAL_COMMUNITY)
Admission: EM | Admit: 2013-07-12 | Discharge: 2013-07-13 | Disposition: A | Discharge status: Home / Self Care | Payer: No Typology Code available for payment source | Attending: Emergency Medicine | Admitting: Emergency Medicine

## 2013-07-12 ENCOUNTER — Emergency Department (HOSPITAL_COMMUNITY): Payer: No Typology Code available for payment source

## 2013-07-12 DIAGNOSIS — R110 Nausea: Secondary | ICD-10-CM | POA: Insufficient documentation

## 2013-07-12 DIAGNOSIS — Z8619 Personal history of other infectious and parasitic diseases: Secondary | ICD-10-CM | POA: Insufficient documentation

## 2013-07-12 DIAGNOSIS — F17200 Nicotine dependence, unspecified, uncomplicated: Secondary | ICD-10-CM | POA: Insufficient documentation

## 2013-07-12 DIAGNOSIS — Z79899 Other long term (current) drug therapy: Secondary | ICD-10-CM | POA: Insufficient documentation

## 2013-07-12 DIAGNOSIS — I10 Essential (primary) hypertension: Secondary | ICD-10-CM | POA: Insufficient documentation

## 2013-07-12 DIAGNOSIS — Z862 Personal history of diseases of the blood and blood-forming organs and certain disorders involving the immune mechanism: Secondary | ICD-10-CM | POA: Insufficient documentation

## 2013-07-12 DIAGNOSIS — R0789 Other chest pain: Secondary | ICD-10-CM | POA: Insufficient documentation

## 2013-07-12 DIAGNOSIS — Z8739 Personal history of other diseases of the musculoskeletal system and connective tissue: Secondary | ICD-10-CM | POA: Insufficient documentation

## 2013-07-12 DIAGNOSIS — Z87448 Personal history of other diseases of urinary system: Secondary | ICD-10-CM | POA: Insufficient documentation

## 2013-07-12 DIAGNOSIS — R079 Chest pain, unspecified: Secondary | ICD-10-CM

## 2013-07-12 DIAGNOSIS — Z8719 Personal history of other diseases of the digestive system: Secondary | ICD-10-CM | POA: Insufficient documentation

## 2013-07-12 DIAGNOSIS — F411 Generalized anxiety disorder: Secondary | ICD-10-CM | POA: Insufficient documentation

## 2013-07-12 LAB — COMPREHENSIVE METABOLIC PANEL
ALT: 27 U/L (ref 0–53)
AST: 22 U/L (ref 0–37)
Albumin: 4.1 g/dL (ref 3.5–5.2)
Alkaline Phosphatase: 72 U/L (ref 39–117)
BUN: 12 mg/dL (ref 6–23)
CO2: 25 mEq/L (ref 19–32)
Calcium: 9.8 mg/dL (ref 8.4–10.5)
Chloride: 100 mEq/L (ref 96–112)
Creatinine, Ser: 0.82 mg/dL (ref 0.50–1.35)
GFR calc Af Amer: >90 mL/min (ref >90)
GFR calc non Af Amer: >90 mL/min (ref >90)
Glucose, Bld: 100 mg/dL — ABNORMAL HIGH (ref 70–99)
Potassium: 3.9 mEq/L (ref 3.7–5.3)
Sodium: 140 mEq/L (ref 137–147)
Total Bilirubin: 0.2 mg/dL — ABNORMAL LOW (ref 0.3–1.2)
Total Protein: 7.5 g/dL (ref 6.0–8.3)

## 2013-07-12 LAB — CBC
HCT: 45.3 % (ref 39.0–52.0)
Hemoglobin: 16.3 g/dL (ref 13.0–17.0)
MCH: 32.2 pg (ref 26.0–34.0)
MCHC: 36 g/dL (ref 30.0–36.0)
MCV: 89.5 fL (ref 78.0–100.0)
PLATELETS: 348 10*3/uL (ref 150–400)
RBC: 5.06 MIL/uL (ref 4.22–5.81)
RDW: 12.9 % (ref 11.5–15.5)
WBC: 11.6 10*3/uL — AB (ref 4.0–10.5)

## 2013-07-12 LAB — I-STAT TROPONIN, ED: Troponin i, poc: 0 ng/mL (ref 0.00–0.08)

## 2013-07-12 NOTE — ED Provider Notes (Signed)
CSN: [id number]     Arrival date & time 07/12/13  2138 History   First MD Initiated Contact with Patient 07/12/13 2315     Chief Complaint  Patient presents with  . Chest Pain     (Consider location/radiation/quality/duration/timing/severity/associated sxs/prior Treatment) HPI HX per PT - L sided CP onset today lasted about 30 minutes. He sells cars for a living, was at work at the time, felt some anxiety and nausea. No SOB. Of note he stopped smoking yesterday.  No cough. No trauma. He was also having some left arm pain, dull in quality mild to mod in severity. He denies any h/o HTN, DM, HLD, CAD and no FH of early CAD.  He is mostly worried about his heart.  Currently asymptomatic.    Past Medical History  Diagnosis Date  . VENEREAL WART 05/15/2007  . POLYCYTHEMIA 05/20/2009  . SMOKER 05/15/2007  . ADD 10/20/2006  . HYPERTENSION 01/25/2008  . GERD 10/20/2006  . HEMATOCHEZIA 12/18/2009  . PSORIASIS 10/20/2006  . BACK PAIN 05/15/2007  . INSOMNIA-SLEEP DISORDER-UNSPEC 05/15/2007  . Palpitations 01/25/2008   History reviewed. No pertinent past surgical history. Family History  Problem Relation Age of Onset  . Hypertension Other     Grandfather   History  Substance Use Topics  . Smoking status: Current Every Day Smoker    Types: Cigarettes  . Smokeless tobacco: Never Used  . Alcohol Use: Yes    Review of Systems  Constitutional: Negative for fever and chills.  Eyes: Negative for visual disturbance.  Respiratory: Negative for shortness of breath.   Cardiovascular: Positive for chest pain.  Gastrointestinal: Negative for vomiting and abdominal pain.  Genitourinary: Negative for flank pain.  Musculoskeletal: Negative for neck pain.  Skin: Negative for rash.  Neurological: Negative for weakness and headaches.  All other systems reviewed and are negative.     Allergies  Review of patient's allergies indicates no known allergies.  Home Medications   Prior to Admission  medications   Medication Sig Start Date End Date Taking? Authorizing Provider  albuterol (PROVENTIL HFA;VENTOLIN HFA) 108 (90 BASE) MCG/ACT inhaler Inhale 1 puff into the lungs every 6 (six) hours as needed for wheezing or shortness of breath.   Yes Historical Provider, MD  Aspirin-Acetaminophen-Caffeine (GOODY HEADACHE PO) Take 1 packet by mouth daily as needed (pain).   Yes Historical Provider, MD  cyclobenzaprine (FLEXERIL) 5 MG tablet Take 1 tablet (5 mg total) by mouth 3 (three) times daily as needed for muscle spasms. 10/31/12  Yes Corwin LevinsJames W John, MD  ibuprofen (ADVIL,MOTRIN) 200 MG tablet Take 400 mg by mouth every 6 (six) hours as needed for moderate pain.   Yes Historical Provider, MD  nicotine (NICODERM CQ - DOSED IN MG/24 HOURS) 21 mg/24hr patch Place 21 mg onto the skin daily.   Yes Historical Provider, MD   BP 131/75  Pulse 86  Temp(Src) 98.6 F (37 C) (Oral)  Resp 14  Ht 5\' 11"  (1.803 m)  Wt 261 lb (118.389 kg)  BMI 36.42 kg/m2  SpO2 97% Physical Exam  Constitutional: He is oriented to person, place, and time. He appears well-developed and well-nourished.  HENT:  Head: Normocephalic and atraumatic.  Mouth/Throat: Oropharynx is clear and moist.  Eyes: Conjunctivae and EOM are normal. Pupils are equal, round, and reactive to light.  Neck: Neck supple.  Cardiovascular: Normal rate, regular rhythm, normal heart sounds and intact distal pulses.   Pulmonary/Chest: Effort normal and breath sounds normal. No respiratory distress. He exhibits  no tenderness.  Abdominal: Soft. There is no tenderness.  Musculoskeletal: Normal range of motion. He exhibits no edema and no tenderness.  Neurological: He is alert and oriented to person, place, and time.  Skin: Skin is warm and dry.    ED Course  Procedures (including critical care time) Labs Review Labs Reviewed  CBC - Abnormal; Notable for the following:    WBC 11.6 (*)    All other components within normal limits  COMPREHENSIVE  METABOLIC PANEL - Abnormal; Notable for the following:    Glucose, Bld 100 (*)    Total Bilirubin 0.2 (*)    All other components within normal limits  I-STAT TROPOININ, ED    Imaging Review No results found.   Date: 07/12/2013  Rate: 93  Rhythm: normal sinus rhythm  QRS Axis: normal  Intervals: normal  ST/T Wave abnormalities: nonspecific ST changes  Conduction Disutrbances:none  Narrative Interpretation:   Old EKG Reviewed: none available  No pain in ER  Plan d/c home, f/u PCP for outpatient stress testing. PT will take ASa daily and return to ER for return of symptoms.   MDM   Dx: CP  Low risk for ACS ECG, CXr and labs including serial troponins reviewed VS and nurses notes reviewed and considered   Sunnie NielsenBrian Amirah Goerke, MD 07/13/13 [phone]

## 2013-07-12 NOTE — ED Notes (Signed)
Patient still gone to X-ray.

## 2013-07-12 NOTE — ED Notes (Signed)
Pt reports intermittent left chest pain radiating to left arm starting yesterday. Pt states he noticed a discomfort in his left chest, then started to become anxious. No other associated symptoms with the chest pain. Pt denies chest pain at this time.

## 2013-07-12 NOTE — ED Notes (Signed)
EKG is at the bedside.

## 2013-07-13 LAB — I-STAT TROPONIN, ED: TROPONIN I, POC: 0 ng/mL (ref 0.00–0.08)

## 2013-07-13 NOTE — Discharge Instructions (Signed)
Chest Pain (Nonspecific) °It is often hard to give a specific diagnosis for the cause of chest pain. There is always a chance that your pain could be related to something serious, such as a heart attack or a blood clot in the lungs. You need to follow up with your caregiver for further evaluation. °CAUSES  °· Heartburn. °· Pneumonia or bronchitis. °· Anxiety or stress. °· Inflammation around your heart (pericarditis) or lung (pleuritis or pleurisy). °· A blood clot in the lung. °· A collapsed lung (pneumothorax). It can develop suddenly on its own (spontaneous pneumothorax) or from injury (trauma) to the chest. °· Shingles infection (herpes zoster virus). °The chest wall is composed of bones, muscles, and cartilage. Any of these can be the source of the pain. °· The bones can be bruised by injury. °· The muscles or cartilage can be strained by coughing or overwork. °· The cartilage can be affected by inflammation and become sore (costochondritis). °DIAGNOSIS  °Lab tests or other studies, such as X-rays, electrocardiography, stress testing, or cardiac imaging, may be needed to find the cause of your pain.  °TREATMENT  °· Treatment depends on what may be causing your chest pain. Treatment may include: °· Acid blockers for heartburn. °· Anti-inflammatory medicine. °· Pain medicine for inflammatory conditions. °· Antibiotics if an infection is present. °· You may be advised to change lifestyle habits. This includes stopping smoking and avoiding alcohol, caffeine, and chocolate. °· You may be advised to keep your head raised (elevated) when sleeping. This reduces the chance of acid going backward from your stomach into your esophagus. °· Most of the time, nonspecific chest pain will improve within 2 to 3 days with rest and mild pain medicine. °HOME CARE INSTRUCTIONS  °· If antibiotics were prescribed, take your antibiotics as directed. Finish them even if you start to feel better. °· For the next few days, avoid physical  activities that bring on chest pain. Continue physical activities as directed. °· Do not smoke. °· Avoid drinking alcohol. °· Only take over-the-counter or prescription medicine for pain, discomfort, or fever as directed by your caregiver. °· Follow your caregiver's suggestions for further testing if your chest pain does not go away. °· Keep any follow-up appointments you made. If you do not go to an appointment, you could develop lasting (chronic) problems with pain. If there is any problem keeping an appointment, you must call to reschedule. °SEEK MEDICAL CARE IF:  °· You think you are having problems from the medicine you are taking. Read your medicine instructions carefully. °· Your chest pain does not go away, even after treatment. °· You develop a rash with blisters on your chest. °SEEK IMMEDIATE MEDICAL CARE IF:  °· You have increased chest pain or pain that spreads to your arm, neck, jaw, back, or abdomen. °· You develop shortness of breath, an increasing cough, or you are coughing up blood. °· You have severe back or abdominal pain, feel nauseous, or vomit. °· You develop severe weakness, fainting, or chills. °· You have a fever. °THIS IS AN EMERGENCY. Do not wait to see if the pain will go away. Get medical help at once. Call your local emergency services (911 in [place] Do not drive yourself to the hospital. °MAKE SURE YOU:  °· Understand these instructions. °· Will watch your condition. °· Will get help right away if you are not doing well or get worse. °Document Released: 11/18/2004 Document Revised: 05/03/2011 Document Reviewed: 09/14/2007 °[employer]® Patient Information ©2014 [employer]   [employer]. ° °

## 2013-07-24 ENCOUNTER — Ambulatory Visit: Payer: No Typology Code available for payment source | Admitting: Internal Medicine

## 2013-07-31 ENCOUNTER — Ambulatory Visit (INDEPENDENT_AMBULATORY_CARE_PROVIDER_SITE_OTHER): Payer: No Typology Code available for payment source | Admitting: Internal Medicine

## 2013-07-31 ENCOUNTER — Encounter: Payer: Self-pay | Admitting: Internal Medicine

## 2013-07-31 VITALS — BP 110/78 | HR 103 | Temp 98.0°F | Ht 72.0 in | Wt 262.0 lb

## 2013-07-31 DIAGNOSIS — I10 Essential (primary) hypertension: Secondary | ICD-10-CM

## 2013-07-31 DIAGNOSIS — Z0000 Encounter for general adult medical examination without abnormal findings: Secondary | ICD-10-CM

## 2013-07-31 DIAGNOSIS — F988 Other specified behavioral and emotional disorders with onset usually occurring in childhood and adolescence: Secondary | ICD-10-CM

## 2013-07-31 MED ORDER — AMPHETAMINE-DEXTROAMPHET ER 30 MG PO CP24: ORAL_CAPSULE | ORAL | Status: DC

## 2013-07-31 NOTE — Progress Notes (Signed)
Subjective:    Patient ID: Isaiah Gibson, male    DOB: 11-Oct-1985, 28 y.o.   MRN: [id number]  HPI  Here for wellness and f/u;  Overall doing ok;  Pt denies CP, worsening SOB, DOE, wheezing, orthopnea, PND, worsening LE edema, palpitations, dizziness or syncope.  Pt denies neurological change such as new headache, facial or extremity weakness.  Pt denies polydipsia, polyuria, or low sugar symptoms. Pt states overall good compliance with treatment and medications, good tolerability, and has been trying to follow lower cholesterol diet.  Pt denies worsening depressive symptoms, suicidal ideation or panic. No fever, night sweats, wt loss, loss of appetite, or other constitutional symptoms.  Pt states good ability with ADL's, has low fall risk, home safety reviewed and adequate, no other significant changes in hearing or vision, and only occasionally active with exercise.  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 VENEREAL WART 05/15/2007  . POLYCYTHEMIA 05/20/2009  . SMOKER 05/15/2007  . ADD 10/20/2006  . HYPERTENSION 01/25/2008  . GERD 10/20/2006  . HEMATOCHEZIA 12/18/2009  . PSORIASIS 10/20/2006  . BACK PAIN 05/15/2007  . INSOMNIA-SLEEP DISORDER-UNSPEC 05/15/2007  . Palpitations 01/25/2008   No past surgical history on file.  reports that he has been smoking Cigarettes.  He has been smoking about 0.00 packs per day. He has never used smokeless tobacco. He reports that he drinks alcohol. He reports that he does not use illicit drugs. family history includes Hypertension in his other. No Known Allergies Current Outpatient Prescriptions on File Prior to Visit  Medication Sig Dispense Refill  . albuterol (PROVENTIL HFA;VENTOLIN HFA) 108 (90 BASE) MCG/ACT inhaler Inhale 1 puff into the lungs every 6 (six) hours as needed for wheezing or  shortness of breath.      . Aspirin-Acetaminophen-Caffeine (GOODY HEADACHE PO) Take 1 packet by mouth daily as needed (pain).      . cyclobenzaprine (FLEXERIL) 5 MG tablet Take 1 tablet (5 mg total) by mouth 3 (three) times daily as needed for muscle spasms.  90 tablet  1  . ibuprofen (ADVIL,MOTRIN) 200 MG tablet Take 400 mg by mouth every 6 (six) hours as needed for moderate pain.      . nicotine (NICODERM CQ - DOSED IN MG/24 HOURS) 21 mg/24hr patch Place 21 mg onto the skin daily.       No current facility-administered medications on file prior to visit.    Review of Systems Constitutional: Negative for increased diaphoresis, other activity, appetite or other siginficant weight change  HENT: Negative for worsening hearing loss, ear pain, facial swelling, mouth sores and neck stiffness.   Eyes: Negative for other worsening pain, redness or visual disturbance.  Respiratory: Negative for shortness of breath and wheezing.   Cardiovascular: Negative for chest pain and palpitations.  Gastrointestinal: Negative for diarrhea, blood in stool, abdominal distention or other pain Genitourinary: Negative for hematuria, flank pain or change in urine volume.  Musculoskeletal: Negative for myalgias or other joint complaints.  Skin: Negative for color change and wound.  Neurological: Negative for syncope and numbness. other than noted Hematological: Negative for adenopathy. or other swelling Psychiatric/Behavioral: Negative for hallucinations, self-injury, decreased concentration or other worsening agitation.      Objective:   Physical Exam BP 110/78  Pulse 103  Temp(Src) 98 F (36.7 C) (Oral)  Ht  6' (1.829 m)  Wt 262 lb (118.842 kg)  BMI 35.53 kg/m2  SpO2 96% VS noted,  Constitutional: Pt is oriented to person, place, and time. Appears well-developed and well-nourished.  Head: Normocephalic and atraumatic.  Right Ear: External ear normal.  Left Ear: External ear normal.  Nose: Nose normal.    Mouth/Throat: Oropharynx is clear and moist.  Eyes: Conjunctivae and EOM are normal. Pupils are equal, round, and reactive to light.  Neck: Normal range of motion. Neck supple. No JVD present. No tracheal deviation present.  Cardiovascular: Normal rate, regular rhythm, normal heart sounds and intact distal pulses.   Pulmonary/Chest: Effort normal and breath sounds without rales or wheezing  Abdominal: Soft. Bowel sounds are normal. NT. No HSM  Musculoskeletal: Normal range of motion. Exhibits no edema.  Lymphadenopathy:  Has no cervical adenopathy.  Neurological: Pt is alert and oriented to person, place, and time. Pt has normal reflexes. No cranial nerve deficit. Motor grossly intact Skin: Skin is warm and dry. No rash noted.  Psychiatric:  Has normal mood and affect. Behavior is normal.     Assessment & Plan:

## 2013-07-31 NOTE — Patient Instructions (Signed)
Please continue all other medications as before, and refills have been done if requested.  Please have the pharmacy call with any other refills you may need.  Please continue your efforts at being more active, low cholesterol diet, and weight control.  You are otherwise up to date with prevention measures today.  Please keep your appointments with your specialists as you may have planned  We can hold on lab work today  Please return in 1 year for your yearly visit, or sooner if needed

## 2013-07-31 NOTE — Progress Notes (Signed)
Pre visit review using our clinic review tool, if applicable. No additional management support is needed unless otherwise documented below in the visit note. 

## 2013-08-01 NOTE — Assessment & Plan Note (Signed)
stable overall by history and exam, and pt to continue medical treatment as before,  to f/u any worsening symptoms or concerns 

## 2013-08-01 NOTE — Assessment & Plan Note (Signed)

## 2013-08-01 NOTE — Assessment & Plan Note (Signed)
stable overall by history and exam, recent data reviewed with pt, and pt to continue medical treatment as before,  to f/u any worsening symptoms or concerns BP Readings from Last 3 Encounters:  07/31/13 110/78  07/13/13 134/84  04/11/12 108/64

## 2013-11-13 ENCOUNTER — Other Ambulatory Visit: Payer: Self-pay | Admitting: Internal Medicine

## 2013-11-13 ENCOUNTER — Telehealth: Payer: Self-pay | Admitting: Internal Medicine

## 2013-11-13 MED ORDER — AMPHETAMINE-DEXTROAMPHET ER 30 MG PO CP24: ORAL_CAPSULE | ORAL | Status: DC

## 2013-11-13 MED ORDER — ALBUTEROL SULFATE HFA 108 (90 BASE) MCG/ACT IN AERS: 1.0000 | INHALATION_SPRAY | Freq: Four times a day (QID) | RESPIRATORY_TRACT | Status: DC | PRN

## 2013-11-13 MED ORDER — AMPHETAMINE-DEXTROAMPHETAMINE 30 MG PO TABS
30.0000 mg | ORAL_TABLET | Freq: Two times a day (BID) | ORAL | Status: DC
Start: 2013-11-13 — End: 2013-11-13

## 2013-11-13 MED ORDER — AMPHETAMINE-DEXTROAMPHETAMINE 30 MG PO TABS: 30.0000 mg | ORAL_TABLET | Freq: Two times a day (BID) | ORAL | Status: DC

## 2013-11-13 MED ORDER — AMPHETAMINE-DEXTROAMPHET ER 30 MG PO CP24: 30.0000 mg | ORAL_CAPSULE | Freq: Two times a day (BID) | ORAL | Status: DC

## 2013-11-13 NOTE — Telephone Encounter (Signed)
Pt called in looking for a refill on albuterol (PROVENTIL HFA;VENTOLIN HFA) 108 (90 BASE) MCG/ACT inhaler [[id number]] He stated that he doesn't want the time release this time.    Thank You!!

## 2013-11-13 NOTE — Telephone Encounter (Signed)
[facility].../lmb 

## 2013-11-13 NOTE — Telephone Encounter (Signed)
Ok for non ER type - Done hardcopy to [employer]

## 2013-11-13 NOTE — Telephone Encounter (Signed)
Pt called back and said that he told me the wrong med.  He was looking for his Adderall    Thank you

## 2013-11-13 NOTE — Telephone Encounter (Signed)
Done hardcopy to robin - 3 mo total rx

## 2013-11-13 NOTE — Telephone Encounter (Signed)
Patient informed to pickup corrected hardcopy's at the front desk at his convenience.

## 2013-11-13 NOTE — Telephone Encounter (Signed)
The patient was called to pickup hardcopy's.  The patient stated he does not want the time released adderall. He was not prescribed time released previously, but the regular.

## 2013-12-11 ENCOUNTER — Telehealth: Payer: Self-pay | Admitting: *Deleted

## 2013-12-11 NOTE — Telephone Encounter (Signed)
Received call from [place]/pharmacist wanting to know if its ok to fill pt adderal script today instead of Thurs 12/13/13 due to pt going out of town. Inform pharmacist ok this time only...[name]/lmb

## 2014-01-07 ENCOUNTER — Other Ambulatory Visit: Payer: Self-pay | Admitting: Internal Medicine

## 2014-01-07 ENCOUNTER — Telehealth: Payer: Self-pay | Admitting: Internal Medicine

## 2014-01-07 NOTE — Telephone Encounter (Signed)
Is requesting script for adderall and tramadol.  Does not want time release.

## 2014-01-07 NOTE — Telephone Encounter (Signed)
Will forward to Dr. Jonny RuizJohn for his response tomorrow...[name]/lmb

## 2014-01-08 NOTE — Telephone Encounter (Signed)
Notified pt with md response. He actually had Nov rx Tramadol [facility].../lb

## 2014-01-08 NOTE — Telephone Encounter (Signed)
Already has rx for nov 21  Already has tramadol done with rx nov 17

## 2014-01-08 NOTE — Telephone Encounter (Signed)
Done hardcopy to robin  

## 2014-01-09 ENCOUNTER — Telehealth: Payer: Self-pay | Admitting: Internal Medicine

## 2014-01-09 NOTE — Telephone Encounter (Signed)
Looked in chart and we have no TB test done/results.  Will forward note for cream question to PCP.

## 2014-01-09 NOTE — Telephone Encounter (Signed)
There are several creams, but I may have mentioned fluocinonide cream

## 2014-01-09 NOTE — Telephone Encounter (Signed)
Called the patient back to inform name if medication.  Also he was trying to obtain lab results from us that was done at Dr. Dorita Sciaralupton's office .   Did check his chart to see if they had been scanned in, but unable to locate.  Informed the patient to call the office responsible for drawing the labs.

## 2014-01-09 NOTE — Telephone Encounter (Signed)
Patient is requesting copy of results of last TB Test.  Patient is also requesting the name of the cream that doctor John prescribed for psoriasis

## 2014-02-11 ENCOUNTER — Telehealth: Payer: Self-pay | Admitting: Internal Medicine

## 2014-02-11 NOTE — Telephone Encounter (Signed)
Will hold until md return tomorrow...[name]/lmb

## 2014-02-11 NOTE — Telephone Encounter (Signed)
Pt called to request refill for adderall. Please advise.

## 2014-02-12 MED ORDER — AMPHETAMINE-DEXTROAMPHETAMINE 30 MG PO TABS: 30.0000 mg | ORAL_TABLET | Freq: Two times a day (BID) | ORAL | Status: DC

## 2014-02-12 NOTE — Telephone Encounter (Signed)
Done hardcopy to robin  

## 2014-02-12 NOTE — Telephone Encounter (Signed)
Called pt no answer LMOM rx ready for pick-up.../lmb 

## 2014-02-17 ENCOUNTER — Telehealth: Payer: Self-pay | Admitting: Family

## 2014-02-17 DIAGNOSIS — R6889 Other general symptoms and signs: Secondary | ICD-10-CM

## 2014-02-17 NOTE — Progress Notes (Signed)
E visit for Flu like symptoms   We are sorry that you are not feeling well.  Here is how we plan to help! Based on what you have shared with me it looks like you may have flu-like symptoms that should be watched but do not seem to indicate anti-viral treatment.  Influenza or "the flu" is   an infection caused by a respiratory virus. The flu virus is highly contagious and persons who did not receive their yearly flu vaccination may "catch" the flu from close contact. We have anti-viral medications to treat the viruses that cause this infection. They are not a "cure" and only shorten the course of the infection. These prescriptions are most effective when they are given within the first 2 days of "flu" symptoms. They are indicated for patients who have elevated risks of complications from infection or who might exposure close contacts who are at risk. These medications can help patients avoid complications from the flu  but have side effects that you should know. Possible side effects from Tamiflu or oseltamivir include nausea, vomiting, diarrhea, dizziness, headaches, eye redness, sleep problems or other respiratory symptoms. You should not take Tamiflu if you have an allergy to oseltamivir or any to the ingredients in Tamiflu. Based upon your symptoms and potential risk factors I recommend that you follow the flu symptoms recommendation that I have listed below.  ANYONE WHO HAS FLU SYMPTOMS SHOULD: . Stay home. The flu is highly contagious and going out or to work exposes others! . Be sure to drink plenty of fluids. Water is fine as well as fruit juices, sodas and electrolyte beverages. You may want to stay away from caffeine or alcohol. If you are nauseated, try taking small sips of liquids. How do you know if you are getting enough fluid? Your urine should be a pale yellow or almost colorless. . Get rest. . Taking a steamy shower or using a humidifier may help nasal congestion and ease sore throat  pain. Using a saline nasal spray works much the same way. . Cough drops, hard candies and sore throat lozenges may ease your cough. . Line up a caregiver. Have someone check on you regularly.   GET HELP RIGHT AWAY IF: . You cannot keep down liquids or your medications. . You become short of breath . Your fell like you are going to pass out or loose consciousness. . Your symptoms persist after you have completed your treatment plan MAKE SURE YOU   Understand these instructions.  Will watch your condition.  Will get help right away if you are not doing well or get worse.  Your e-visit answers were reviewed by a board certified advanced clinical practitioner to complete your personal care plan.  Depending on the condition, your plan could have included both over the counter or prescription medications.  If there is a problem please reply  once you have received a response from your provider.  Your safety is important to us.  If you have drug allergies check your prescription carefully.    You can use MyChart to ask questions about today's visit, request a non-urgent call back, or ask for a work or school excuse.  You will get an e-mail in the next two days asking about your experience.  I hope that your e-visit has been valuable and will speed your recovery. Thank you for using e-visits.   

## 2014-03-19 ENCOUNTER — Telehealth: Payer: Self-pay | Admitting: Internal Medicine

## 2014-03-19 NOTE — Telephone Encounter (Signed)
Patient requesting refill of amphetamine-dextroamphetamine (ADDERALL) 30 MG tablet ° ° °

## 2014-03-20 MED ORDER — AMPHETAMINE-DEXTROAMPHETAMINE 30 MG PO TABS: 30.0000 mg | ORAL_TABLET | Freq: Two times a day (BID) | ORAL | Status: DC

## 2014-03-20 NOTE — Telephone Encounter (Signed)
Done hardcopy to robin  

## 2014-03-20 NOTE — Telephone Encounter (Signed)
Called the patient informed to pickup hardcopy of adderall at the front desk. 

## 2014-04-23 ENCOUNTER — Telehealth: Payer: Self-pay | Admitting: Internal Medicine

## 2014-04-23 MED ORDER — AMPHETAMINE-DEXTROAMPHETAMINE 30 MG PO TABS: 30.0000 mg | ORAL_TABLET | Freq: Two times a day (BID) | ORAL | Status: DC

## 2014-04-23 NOTE — Telephone Encounter (Signed)
Done hardcopy to Cherina  

## 2014-04-23 NOTE — Telephone Encounter (Signed)
Pt called in requesting refill on his amphetamine-dextroamphetamine (ADDERALL) 30 MG tablet [[id number]][[id number]]

## 2014-04-24 NOTE — Telephone Encounter (Signed)
Attempted to call patient to tell him that his RX is ready for pickup. Voicemail was full at the time. Will attempt to call back later.

## 2014-04-26 NOTE — Telephone Encounter (Signed)
Left VM for pt. To pick up RX at front desk.

## 2014-05-30 ENCOUNTER — Telehealth: Payer: Self-pay | Admitting: Internal Medicine

## 2014-05-30 MED ORDER — AMPHETAMINE-DEXTROAMPHETAMINE 30 MG PO TABS: 30.0000 mg | ORAL_TABLET | Freq: Two times a day (BID) | ORAL | Status: DC

## 2014-05-30 NOTE — Telephone Encounter (Signed)
Done hardcopy to Cherina  

## 2014-05-30 NOTE — Telephone Encounter (Signed)
Pt. Notified.

## 2014-05-30 NOTE — Telephone Encounter (Signed)
Pt called in need refill on his   amphetamine-dextroamphetamine (ADDERALL) 30 MG tablet [[id number]

## 2014-07-01 ENCOUNTER — Telehealth: Payer: Self-pay | Admitting: Internal Medicine

## 2014-07-01 NOTE — Telephone Encounter (Signed)
Patient reqeusting refill of amphetamine-dextroamphetamine (ADDERALL) 30 MG tablet [[id number]][[id number]]   Also states that next month he will be leaving the [place] from the [place] to [place]. Asks that a script be called in a little early (perhaps the 1st or 2nd).

## 2014-07-02 MED ORDER — AMPHETAMINE-DEXTROAMPHETAMINE 30 MG PO TABS: 30.0000 mg | ORAL_TABLET | Freq: Two times a day (BID) | ORAL | Status: DC

## 2014-07-02 NOTE — Telephone Encounter (Signed)
Called pt no answer LMOM rx ready for pick-up.../lmb 

## 2014-07-02 NOTE — Telephone Encounter (Signed)
Done hardcopy to Cherina  

## 2014-09-09 ENCOUNTER — Telehealth: Payer: Self-pay | Admitting: Internal Medicine

## 2014-09-09 NOTE — Telephone Encounter (Signed)
Pt called in and would like a refill on his amphetamine-dextroamphetamine (ADDERALL) 30 MG tablet [[id number]][[id number]]

## 2014-09-09 NOTE — Telephone Encounter (Signed)
Please advise, thanks.

## 2014-09-12 MED ORDER — AMPHETAMINE-DEXTROAMPHETAMINE 30 MG PO TABS: 30.0000 mg | ORAL_TABLET | Freq: Two times a day (BID) | ORAL | Status: DC

## 2014-09-12 NOTE — Telephone Encounter (Signed)
Done hardcopy to patient at front desk

## 2014-10-16 ENCOUNTER — Encounter: Payer: Self-pay | Admitting: Internal Medicine

## 2014-10-16 ENCOUNTER — Ambulatory Visit (INDEPENDENT_AMBULATORY_CARE_PROVIDER_SITE_OTHER): Payer: BLUE CROSS/BLUE SHIELD | Admitting: Internal Medicine

## 2014-10-16 VITALS — BP 134/84 | HR 85 | Temp 97.8°F | Ht 72.0 in | Wt 248.0 lb

## 2014-10-16 DIAGNOSIS — F909 Attention-deficit hyperactivity disorder, unspecified type: Secondary | ICD-10-CM

## 2014-10-16 DIAGNOSIS — I10 Essential (primary) hypertension: Secondary | ICD-10-CM | POA: Diagnosis not present

## 2014-10-16 DIAGNOSIS — F988 Other specified behavioral and emotional disorders with onset usually occurring in childhood and adolescence: Secondary | ICD-10-CM

## 2014-10-16 DIAGNOSIS — Z0000 Encounter for general adult medical examination without abnormal findings: Secondary | ICD-10-CM | POA: Diagnosis not present

## 2014-10-16 MED ORDER — AMPHETAMINE-DEXTROAMPHETAMINE 30 MG PO TABS: 30.0000 mg | ORAL_TABLET | Freq: Two times a day (BID) | ORAL | Status: DC

## 2014-10-16 NOTE — Assessment & Plan Note (Signed)
Stable, for med refills 
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
## 2014-10-16 NOTE — Patient Instructions (Signed)
Please continue all other medications as before, and refills have been done if requested.  Please have the pharmacy call with any other refills you may need.  Please continue your efforts at being more active, low cholesterol diet, and weight control.  You are otherwise up to date with prevention measures today.  Please keep your appointments with your specialists as you may have planned - Dermatology with labs tomorrow  Please go to the LAB in the Basement (turn left off the elevator) for the tests to be done at your convenience  You will be contacted by phone if any changes need to be made immediately.  Otherwise, you will receive a letter about your results with an explanation, but please check with MyChart first.  Please remember to sign up for MyChart if you have not done so, as this will be important to you in the future with finding out test results, communicating by private email, and scheduling acute appointments online when needed.  Please return in 1 year for your yearly visit, or sooner if needed, with Lab testing done 3-5 days before

## 2014-10-16 NOTE — Assessment & Plan Note (Signed)
Controlled with wt loss, cont wt loss efforts BP Readings from Last 3 Encounters:  10/16/14 134/84  07/31/13 110/78  07/13/13 134/84

## 2014-10-16 NOTE — Progress Notes (Signed)
Pre visit review using our clinic review tool, if applicable. No additional management support is needed unless otherwise documented below in the visit note. 

## 2014-10-16 NOTE — Assessment & Plan Note (Signed)

## 2015-01-15 ENCOUNTER — Telehealth: Payer: Self-pay

## 2015-01-15 MED ORDER — AMPHETAMINE-DEXTROAMPHETAMINE 30 MG PO TABS: 30.0000 mg | ORAL_TABLET | Freq: Two times a day (BID) | ORAL | Status: DC

## 2015-01-15 NOTE — Telephone Encounter (Signed)
Done hardcopy to Dahlia  

## 2015-01-15 NOTE — Addendum Note (Signed)
Addended by: Corwin LevinsJOHN, Amanat Hackel W on: 01/15/2015 12:31 PM   Modules accepted: Orders

## 2015-01-15 NOTE — Telephone Encounter (Signed)
Patient is requesting rx refill for adderall 30mg , qty 60----please advise, thanks

## 2015-01-15 NOTE — Telephone Encounter (Signed)
Pt informed, Rx in cabinet for pt pick up  

## 2015-03-03 ENCOUNTER — Telehealth: Payer: Self-pay

## 2015-03-03 NOTE — Telephone Encounter (Signed)
LMOM rq rf of Adderall 30 mg

## 2015-03-04 MED ORDER — AMPHETAMINE-DEXTROAMPHETAMINE 30 MG PO TABS: 30.0000 mg | ORAL_TABLET | Freq: Two times a day (BID) | ORAL | Status: DC

## 2015-03-04 NOTE — Telephone Encounter (Signed)
Done hardcopy to Dahlia  

## 2015-03-04 NOTE — Telephone Encounter (Signed)
Pt informed, Rx in cabinet for pt pick up  

## 2015-04-22 ENCOUNTER — Encounter: Payer: Self-pay | Admitting: Family

## 2015-04-22 ENCOUNTER — Ambulatory Visit (INDEPENDENT_AMBULATORY_CARE_PROVIDER_SITE_OTHER): Payer: 59 | Admitting: Family

## 2015-04-22 VITALS — BP 122/80 | HR 77 | Temp 98.0°F | Resp 16 | Ht 72.0 in | Wt 252.0 lb

## 2015-04-22 DIAGNOSIS — M779 Enthesopathy, unspecified: Secondary | ICD-10-CM

## 2015-04-22 DIAGNOSIS — S29091A Other injury of muscle and tendon of front wall of thorax, initial encounter: Secondary | ICD-10-CM | POA: Diagnosis not present

## 2015-04-22 DIAGNOSIS — M778 Other enthesopathies, not elsewhere classified: Secondary | ICD-10-CM

## 2015-04-22 DIAGNOSIS — S29011A Strain of muscle and tendon of front wall of thorax, initial encounter: Secondary | ICD-10-CM | POA: Insufficient documentation

## 2015-04-22 MED ORDER — DICLOFENAC SODIUM 2 % TD SOLN: 1.0000 "application " | Freq: Two times a day (BID) | TRANSDERMAL | Status: DC | PRN

## 2015-04-22 NOTE — Patient Instructions (Addendum)
Thank you for choosing [facility].  Summary/Instructions:   Ice 2-3 times per and after activity. Stretching daily. Limit heavy lifting.  Pennsaid - 2x daily as needed; pinkie sized dose. Follow up in 3 weeks if symptoms do not improve.   Your prescription(s) have been submitted to your pharmacy or been printed and provided for you. Please take as directed and contact our office if you believe you are having problem(s) with the medication(s) or have any questions  If your symptoms worsen or fail to improve, please contact our office for further instruction, or in case of emergency go directly to the emergency room at the closest medical facility.   Medial Epicondylitis With Rehab Medial epicondylitis involves inflammation and pain around the inner (medial) portion of the elbow. This pain is caused by inflammation of the tendons in the forearm that flex (bring down) the wrist. Medial epicondylitis is also called golfer's elbow, because it is common among golfers. However, it may occur in any individual who flexes the wrist regularly. If medial epicondylitis is left untreated, it may become a chronic problem. SYMPTOMS   Pain, tenderness, or inflammation over the inner (medial) side of the elbow.  Pain or weakness with gripping activities.  Pain that increases with wrist twisting motions (using a screwdriver, playing golf, bowling). CAUSES  Medial epicondylitis is caused by inflammation of the tendons that flex the wrist. Causes of injury may include:  Chronic, repetitive stress and strain to the tendons that run from the wrist and forearm to the elbow.  Sudden strain on the forearm, including wrist snap when serving balls with racquet sports, or throwing a baseball. RISK INCREASES WITH:  Sports or occupations that require repetitive and/or strenuous forearm and wrist movements (pitching a baseball, golfing, carpentry).  Poor wrist and forearm strength and  flexibility.  Failure to warm up properly before activity.  Resuming activity before healing, rehabilitation, and conditioning are complete. PREVENTION   Warm up and stretch properly before activity.  Maintain physical fitness:  Strength, flexibility, and endurance.  Cardiovascular fitness.  Wear and use properly fitted equipment.  Learn and use proper technique and have a coach correct improper technique.  Wear a tennis elbow (counterforce) brace. PROGNOSIS  The course of this condition depends on the degree of the injury. If treated properly, acute cases (symptoms lasting less than 4 weeks) are often resolved in 2 to 6 weeks. Chronic (longer lasting cases) often resolve in 3 to 6 months, but may require physical therapy. RELATED COMPLICATIONS   Frequently recurring symptoms, resulting in a chronic problem. Properly treating the problem the first time decreases frequency of recurrence.  Chronic inflammation, scarring, and partial tendon tear, requiring surgery.  Delayed healing or resolution of symptoms. TREATMENT  Treatment first involves the use of ice and medicine, to reduce pain and inflammation. Strengthening and stretching exercises may reduce discomfort, if performed regularly. These exercises may be performed at home, if the condition is an acute injury. Chronic cases may require a referral to a physical therapist for evaluation and treatment. Your caregiver may advise a corticosteroid injection to help reduce inflammation. Rarely, surgery is needed. MEDICATION  If pain medicine is needed, nonsteroidal anti-inflammatory medicines (aspirin and ibuprofen), or other minor pain relievers (acetaminophen), are often advised.  Do not take pain medicine for 7 days before surgery.  Prescription pain relievers may be given, if your caregiver thinks they are needed. Use only as directed and only as much as you need.  Corticosteroid injections may be  recommended. These injections  should be reserved only for the most severe cases, because they can only be given a certain number of times. HEAT AND COLD  Cold treatment (icing) should be applied for 10 to 15 minutes every 2 to 3 hours for inflammation and pain, and immediately after activity that aggravates your symptoms. Use ice packs or an ice massage.  Heat treatment may be used before performing stretching and strengthening activities prescribed by your caregiver, physical therapist, or athletic trainer. Use a heat pack or a warm water soak. SEEK MEDICAL CARE IF: Symptoms get worse or do not improve in 2 weeks, despite treatment. EXERCISES  RANGE OF MOTION (ROM) AND STRETCHING EXERCISES - Epicondylitis, Medial (Golfer's Elbow) These exercises may help you when beginning to rehabilitate your injury. Your symptoms may go away with or without further involvement from your physician, physical therapist or athletic trainer. While completing these exercises, remember:   Restoring tissue flexibility helps normal motion to return to the joints. This allows healthier, less painful movement and activity.  An effective stretch should be held for at least 30 seconds.  A stretch should never be painful. You should only feel a gentle lengthening or release in the stretched tissue. RANGE OF MOTION - Wrist Flexion, Active-Assisted  Extend your right / left elbow with your fingers pointing down.*  Gently pull the back of your hand towards you, until you feel a gentle stretch on the top of your forearm.  Hold this position for __________ seconds. Repeat __________ times. Complete this exercise __________ times per day.  *If directed by your physician, physical therapist or athletic trainer, complete this stretch with your elbow bent, rather than extended. RANGE OF MOTION - Wrist Extension, Active-Assisted  Extend your right / left elbow and turn your palm upwards.*  Gently pull your palm and fingertips back, so your wrist extends  and your fingers point more toward the ground.  You should feel a gentle stretch on the inside of your forearm.  Hold this position for __________ seconds. Repeat __________ times. Complete this exercise __________ times per day. *If directed by your physician, physical therapist or athletic trainer, complete this stretch with your elbow bent, rather than extended. STRETCH - Wrist Extension   Place your right / left fingertips on a tabletop leaving your elbow slightly bent. Your fingers should point backwards.  Gently press your fingers and palm down onto the table, by straightening your elbow. You should feel a stretch on the inside of your forearm.  Hold this position for __________ seconds. Repeat __________ times. Complete this stretch __________ times per day.  STRENGTHENING EXERCISES - Epicondylitis, Medial (Golfer's Elbow) These exercises may help you when beginning to rehabilitate your injury. They may resolve your symptoms with or without further involvement from your physician, physical therapist or athletic trainer. While completing these exercises, remember:   Muscles can gain both the endurance and the strength needed for everyday activities through controlled exercises.  Complete these exercises as instructed by your physician, physical therapist or athletic trainer. Increase the resistance and repetitions only as guided.  You may experience muscle soreness or fatigue, but the pain or discomfort you are trying to eliminate should never worsen during these exercises. If this pain does get worse, stop and make sure you are following the directions exactly. If the pain is still present after adjustments, discontinue the exercise until you can discuss the trouble with your caregiver. STRENGTH - Wrist Flexors  Sit with your right / left  forearm palm-up, and fully supported on a table or countertop. Your elbow should be resting below the height of your shoulder. Allow your wrist to  extend over the edge of the surface.  Loosely holding a __________ weight, or a piece of rubber exercise band or tubing, slowly curl your hand up toward your forearm.  Hold this position for __________ seconds. Slowly lower the wrist back to the starting position in a controlled manner. Repeat __________ times. Complete this exercise __________ times per day.  STRENGTH - Wrist Extensors  Sit with your right / left forearm palm-down and fully supported. Your elbow should be resting below the height of your shoulder. Allow your wrist to extend over the edge of the surface.  Loosely holding a __________ weight, or a piece of rubber exercise band or tubing, slowly curl your hand up toward your forearm.  Hold this position for __________ seconds. Slowly lower the wrist back to the starting position in a controlled manner. Repeat __________ times. Complete this exercise __________ times per day.  STRENGTH - Ulnar Deviators  Stand with a ____________________ weight in your right / left hand, or sit while holding a rubber exercise band or tubing, with your healthy arm supported on a table or countertop.  Move your wrist so that your pinkie travels toward your forearm and your thumb moves away from your forearm.  Hold this position for __________ seconds and then slowly lower the wrist back to the starting position. Repeat __________ times. Complete this exercise __________ times per day STRENGTH - Grip   Grasp a tennis ball, a dense sponge, or a large, rolled sock in your hand.  Squeeze as hard as you can, without increasing any pain.  Hold this position for __________ seconds. Release your grip slowly. Repeat __________ times. Complete this exercise __________ times per day.  STRENGTH - Forearm Supinators   Sit with your right / left forearm supported on a table, keeping your elbow below shoulder height. Rest your hand over the edge, palm down.  Gently grip a hammer or a soup  ladle.  Without moving your elbow, slowly turn your palm and hand upward to a "thumbs-up" position.  Hold this position for __________ seconds. Slowly return to the starting position. Repeat __________ times. Complete this exercise __________ times per day.  STRENGTH - Forearm Pronators  Sit with your right / left forearm supported on a table, keeping your elbow below shoulder height. Rest your hand over the edge, palm up.  Gently grip a hammer or a soup ladle.  Without moving your elbow, slowly turn your palm and hand upward to a "thumbs-up" position.  Hold this position for __________ seconds. Slowly return to the starting position. Repeat __________ times. Complete this exercise __________ times per day.    This information is not intended to replace advice given to you by your health care provider. Make sure you discuss any questions you have with your health care provider.   Document Released: 02/08/2005 Document Revised: 03/01/2014 Document Reviewed: 05/23/2008 Elsevier Interactive Patient Education 2016 [employer] TREATMENT Treatment first involves resting from any activities that aggravate the symptoms. The use of ice and medicine will help reduce pain and inflammation. The use of strengthening and stretching exercises may help reduce pain with activity. These exercises may be performed at home. However, referral to a therapist may be advised for further evaluation and treatment, such as ultrasound therapy. If the rupture occurs in the muscle or the muscle-tendon juncture, surgery repair is  not possible. However, for tears that occur at the attachment site of the arm bone, surgery may be advised. Without surgery, the loss of normal armpit shape and weakness of the shoulder will persist. For the best chance of a successful outcome, surgery must be performed within the first few weeks after injury. After surgery, the chest and shoulder of the affected side are restrained, to allow for  healing. After restraint, it is important to perform strengthening and stretching exercises to help regain strength and a full range of motion.  MEDICATION   If pain medicine is needed, nonsteroidal anti-inflammatory medicines (aspirin and ibuprofen), or other minor pain relievers (acetaminophen), are often advised.  Do not take pain medicine for 7 days before surgery.  Prescription pain relievers may be given, if your caregiver thinks they are needed. Use only as directed and only as much as you need. COLD THERAPY  Cold treatment (icing) should be applied for 10 to 15 minutes every 2 to 3 hours for inflammation and pain, and immediately after activity that aggravates your symptoms. Use ice packs or an ice massage. SEEK MEDICAL CARE IF:  Pain increases, despite treatment.  Any of the following occur after surgery: signs of infection, including fever, increased pain, swelling, redness, drainage of fluids, or bleeding in the affected area.  New, unexplained symptoms develop. (Drugs used in treatment may produce side effects.) EXERCISES RANGE OF MOTION (ROM) AND STRETCHING EXERCISES - Pectoralis Major Rupture These exercises may help you when beginning to rehabilitate your injury. Your symptoms may resolve with or without further involvement from your physician, physical therapist or athletic trainer. While completing these exercises, remember:   Restoring tissue flexibility helps normal motion to return to the joints. This allows healthier, less painful movement and activity.  An effective stretch should be held for at least 30 seconds.  A stretch should never be painful. You should only feel a gentle lengthening or release in the stretched tissue. ROM - Pendulum  Bend at the waist so that your right / left arm falls away from your body. Support yourself with your opposite hand on a solid surface, such as a table or a countertop.  Your right / left arm should be perpendicular to the  ground. If it is not perpendicular, you need to lean over farther. Relax the muscles in your right / left arm and shoulder as much as possible.  Gently sway your hips and trunk so they move your right / left arm without any use of your right / left shoulder muscles.  Progress your movements so that your right / left arm moves side to side, then forward and backward, and finally, both clockwise and counterclockwise.  Complete __________ repetitions in each direction. Many people use this exercise to relieve discomfort in their shoulder, as well as to gain range of motion. Repeat __________ times. Complete this exercise __________ times per day. STRETCH - Flexion, Standing  Stand with good posture. With an underhand grip on your right / left and an overhand grip on the opposite hand, grasp a broomstick or cane so that your hands are a little more than shoulder width apart.  Keeping your right / left elbow straight and shoulder muscles relaxed, push the stick with your opposite hand to raise your right / left arm in front of your body and then overhead. Raise your arm until you feel a stretch in your right / left shoulder, but before you have increased shoulder pain.  Try to avoid shrugging  your right / left shoulder as your arm rises, by keeping your shoulder blade tucked down and toward your mid-back spine. Hold __________ seconds.  Slowly return to the starting position. Repeat __________ times. Complete this exercise __________ times per day.  STRETCH - Abduction, Supine  Lie on your back. With an underhand grip on your right / left hand and an overhand grip on the opposite hand, grasp a broomstick or cane so that your hands are a little more than shoulder width apart.  Keeping your right / left elbow straight and shoulder muscles relaxed, push the stick with your opposite hand to raise your right / left arm out to the side of your body and then overhead. Raise your arm until you feel a  stretch in your right / left shoulder, but before you have increased shoulder pain.  Try to avoid shrugging your right / left shoulder as your arm rises, by keeping your shoulder blade tucked down and toward your mid-back spine. Hold __________ seconds.  Slowly return to the starting position. Repeat __________ times. Complete this exercise __________ times per day.  ROM - Flexion, Active-Assisted  Lie on your back. You may bend your knees for comfort.  Grasp a broomstick or cane so your hands are about shoulder width apart. Your right / left hand should grip the end of the stick, so that your hand is positioned "thumbs-up," as if you were about to shake hands.  Using your healthy arm to lead, raise your right / left arm overhead until you feel a gentle stretch in your shoulder. Hold __________ seconds.  Use the stick to assist in returning your right / left arm to its starting position. Repeat __________ times. Complete this exercise __________ times per day.  STRETCH - External Rotation and Abductio  Stagger your stance through a doorframe. It does not matter which foot is forward.  Choose one of the following positions as instructed by your physician, physical therapist or athletic trainer. Place your hands:  and forearms above your head and on the door frame.  and forearms at head height and on the door frame.  at elbow height and on the door frame.  Keeping your head and chest upright and your stomach muscles tight to prevent over-extending your low-back, slowly shift your weight onto your front foot until you feel a stretch across your chest and in the front of your shoulders.  Hold __________ seconds. Shift your weight to your back foot to release the stretch. Repeat __________ times. Complete this stretch __________ times per day.  STRENGTHENING EXERCISES - Pectoralis Major Rupture  These exercises may help you when beginning to rehabilitate your injury. They may resolve your  symptoms with or without further involvement from your physician, physical therapist or athletic trainer. While completing these exercises, remember:   Muscles can gain both the endurance and the strength needed for everyday activities through controlled exercises.  Complete these exercises as instructed by your physician, physical therapist or athletic trainer. Increase the resistance and repetitions only as guided by your caregiver.  You may experience muscle soreness or fatigue, but the pain or discomfort you are trying to eliminate should never worsen during these exercises. If this pain does worsen, stop and make certain you are following the directions exactly. If the pain is still present after adjustments, discontinue the exercise until you can discuss the trouble with your caregiver. STRENGTH - Scapular Protractors, Standing  Stand arms length away from a wall. Place your hands  on the wall, keeping your elbows straight.  Begin by dropping your shoulder blades down and toward your mid-back spine.  To strengthen your protractors, keep your shoulder blades down, but slide them forward on your rib cage. It will feel as if you are lifting the back of your rib cage away from the wall. This is a subtle motion and can be challenging to complete. Ask your caregiver for further instruction, if you are not sure you are doing the exercise correctly.  Hold for __________ seconds. Slowly return to the starting position, resting the muscles completely before starting the next repetition. Repeat __________ times. Complete this exercise __________ times per day. STRENGTH - Horizontal Abductors Choose one of the two positions to complete this exercise. Prone: lying on stomach:  Lie on your stomach on a firm surface so that your right / left arm overhangs the edge. Rest your forehead on your opposite forearm. With your palm facing the floor and your elbow straight, hold a __________ weight in your  hand.  Squeeze your right / left shoulder blade to your mid-back spine and then slowly raise your arm to the height of the bed.  Hold for __________ seconds. Slowly reverse the directions and return to the starting position, controlling the weight as you lower your arm. Repeat __________ times. Complete this exercise __________ times per day. Standing:   Secure a rubber exercise band or tubing to a fixed object (table, pole) so that it is at the height of your shoulders when you are either standing, or sitting on a firm armless chair.  Grasp an end of the band in each hand and have your palms face each other. Straighten your elbows and lift your hands straight in front of you at shoulder height. Step back away from the secured end of band until it becomes tense.  Squeeze your shoulder blades together. Keeping your elbows locked and your hands at shoulder height, bring your hands out to your sides.  Hold __________ seconds. Slowly ease the tension on the band, as you reverse the directions and return to the starting position. Repeat __________ times. Complete this exercise __________ times per day. STRENGTH - Scapular Protractors, Quadruped  Get onto your hands and knees with your shoulders directly over your hands (or as close as you can comfortably be).  Keeping your elbows locked, lift the back of your rib cage up into your shoulder blades so your mid-back rounds out. Keep your neck muscles relaxed.  Hold this position for __________ seconds. Slowly return to the starting position and allow your muscles to relax completely before starting the next repetition. Repeat __________ times. Complete this exercise __________ times per day.  STRENGTH - Scapular Depressors  Find a sturdy chair without wheels, such as a dining table chair, and place your hands on the armrests.  Keeping your feet on the floor, lift your bottom from the seat and lock your elbows.  Keeping your elbows straight,  allow gravity to pull your body weight down. Your shoulders will rise toward your ears.  Raise your body against gravity by drawing your shoulder blades down your back, shortening the distance between your shoulders and ears. Although your feet should always maintain contact with the floor, your feet should progressively support less body weight as you get stronger.  Hold __________ seconds. In a controlled and slow manner, lower your body weight to begin the next repetition. Repeat __________ times. Complete this exercise __________ times per day.  STRENGTH - Scapular  Protractors, Supine  Lie on your back on a firm surface. Extend your right / left arm straight into the air while holding a __________ weight in your hand.  Keeping your head and back in place, lift your shoulder off the floor.  Hold __________ seconds. Slowly return to the starting position and allow your muscles to relax completely before starting the next repetition. Repeat __________ times. Complete this exercise __________ times per day.   This information is not intended to replace advice given to you by your health care provider. Make sure you discuss any questions you have with your health care provider.   Document Released: 02/08/2005 Document Revised: 06/25/2014 Document Reviewed: 05/23/2008 [employer] Interactive Patient Education [employer]

## 2015-04-22 NOTE — Assessment & Plan Note (Signed)
Symptoms and exam consistent with wrist flexor tendonitis. Treat conservatively with ice and home exercise therapy. Limit eccentric contractions. Forearm strap as needed. Ibuprofen and Pennsaid for discomfort and inflammation. Follow up in 3 weeks or sooner if symptoms worsen or fail to improve.

## 2015-04-22 NOTE — Assessment & Plan Note (Signed)
Symptoms and exam consistent with pectoralis major muscle strain/tendinitis. Treat conservatively with ice and home exercise therapy. Pennsaid and ibuprofen as needed for inflammation. Limit heavy lifting. Follow up in 3 weeks if symptoms do not improve for imaging and additional therapy.

## 2015-04-22 NOTE — Progress Notes (Signed)
Subjective:    Patient ID: Isaiah Gibson, male    DOB: 1985-11-27, 30 y.o.   MRN: [id number]  Chief Complaint  Patient presents with  . Elbow Pain    Left shoulder pain x3 months and elbow and forearm has been bothering him or a about a month, the pain is coming from working out    HPI:  Isaiah Gibson is a 30 y.o. 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 and presents today For an acute office visit.  1.) Left shoulder - Associated symptom of pain located in his left shoulder has been going on for after using the [facility] when he felt an initial popping sensation as was not able to lift his arm for about 2 days. Modifying factors include stretching which did help with his symptoms. No current pain, however after a shoulder day he continues to experience pain.   2.) Right elbow - This is a new problem. Associated symptom of pain located around his lateral epicondyle and forearm has been going on for about a month. Denies any significant trauma. Pain is described as dull and shooting on occasion. He does have previous forearm surgery on this arm. Modifying factors include ibuprofen which did help with his symptoms.  No Known Allergies   Outpatient Prescriptions Prior to Visit  Medication Sig Dispense Refill  . albuterol (PROVENTIL HFA;VENTOLIN HFA) 108 (90 BASE) MCG/ACT inhaler Inhale 1 puff into the lungs every 6 (six) hours as needed for wheezing or shortness of breath. 18 g 2  . amphetamine-dextroamphetamine (ADDERALL) 30 MG tablet Take 1 tablet by mouth 2 (two) times daily. To fill jan 10., 2017 60 tablet 0  . Aspirin-Acetaminophen-Caffeine (GOODY HEADACHE PO) Take 1 packet by mouth daily as needed (pain).      . cyclobenzaprine (FLEXERIL) 5 MG tablet Take 1 tablet (5 mg total) by mouth 3 (three) times daily as needed for muscle spasms. 90 tablet 1  . HUMIRA PEN 40 MG/0.8ML PNKT   2  . ibuprofen (ADVIL,MOTRIN) 200 MG tablet Take 400 mg by mouth every 6 (six) hours as needed for moderate pain.    . nicotine (NICODERM CQ - DOSED IN MG/24 HOURS) 21 mg/24hr patch Place 21 mg onto the skin daily.    . traMADol (ULTRAM) 50 MG tablet TAKE 1 TABLET BY MOUTH EVERY 6 HOURS AS NEEDED FOR PAIN 60 tablet 1   No facility-administered medications prior to visit.     Past Medical History  Diagnosis Date  . VENEREAL WART 05/15/2007  . POLYCYTHEMIA 05/20/2009  . SMOKER 05/15/2007  . ADD 10/20/2006  . HYPERTENSION 01/25/2008  . GERD 10/20/2006  . HEMATOCHEZIA 12/18/2009  . PSORIASIS 10/20/2006  . BACK PAIN 05/15/2007  . INSOMNIA-SLEEP DISORDER-UNSPEC 05/15/2007  . Palpitations 01/25/2008     No past surgical history on file.   Family History  Problem Relation Age of Onset  . Hypertension Other     Grandfather     Social History   Social History  . Marital Status: Single    Spouse Name: N/A  . Number of Children: N/A  . Years of Education: N/A   Occupational History  . Not on file.   Social History Main Topics  . Smoking status: Former Smoker    Types: Cigarettes    Quit date: 09/19/2014  .  Smokeless tobacco: Never Used  . Alcohol Use: 0.0 oz/week    0 Standard drinks or equivalent per week  . Drug Use: No  . Sexual Activity: Not on file   Other Topics Concern  . Not on file   Social History Narrative     Review of Systems  Constitutional: Negative for fever and chills.  Musculoskeletal:       Positive for left shoulder and right forearm pain  Neurological: Negative for weakness and numbness.      Objective:    BP 122/80 mmHg  Pulse 77  Temp(Src) 98 F (36.7 C) (Oral)  Resp 16  Ht 6' (1.829 m)  Wt 252 lb (114.306 kg)  BMI 34.17 kg/m2  SpO2 97% Nursing note and vital  signs reviewed.  Physical Exam  Constitutional: He is oriented to person, place, and time. He appears well-developed and well-nourished. No distress.  Cardiovascular: Normal rate, regular rhythm, normal heart sounds and intact distal pulses.   Pulmonary/Chest: Effort normal and breath sounds normal.  Musculoskeletal:  Left shoulder - no obvious deformity, discoloration, or edema noted. Palpable tenderness just below coracoid process. No other tenderness able to be elicited. Range of motion is normal in all directions. Strength is 5+. Distal pulses and sensation are intact and appropriate. Negative Leanord Asal; negative Neer's impingement; negative apprehension.  Right forearm - no obvious deformity, discoloration, or edema noted. Tenderness middle one third of wrist flexor muscle group. Elbow and wrist range of motion within normal limits and strength is 5+. Distal pulses and sensation are intact and appropriate.  Neurological: He is alert and oriented to person, place, and time.  Skin: Skin is warm and dry.  Psychiatric: He has a normal mood and affect. His behavior is normal. Judgment and thought content normal.       Assessment & Plan:   Problem List Items Addressed This Visit      Musculoskeletal and Integument   Pectoralis muscle strain - Primary    Symptoms and exam consistent with pectoralis major muscle strain/tendinitis. Treat conservatively with ice and home exercise therapy. Pennsaid and ibuprofen as needed for inflammation. Limit heavy lifting. Follow up in 3 weeks if symptoms do not improve for imaging and additional therapy.      Relevant Medications   Diclofenac Sodium (PENNSAID) 2 % SOLN   Tendinitis of forearm    Symptoms and exam consistent with wrist flexor tendonitis. Treat conservatively with ice and home exercise therapy. Limit eccentric contractions. Forearm strap as needed. Ibuprofen and Pennsaid for discomfort and inflammation. Follow up in 3 weeks or sooner  if symptoms worsen or fail to improve.       Relevant Medications   Diclofenac Sodium (PENNSAID) 2 % SOLN

## 2015-04-22 NOTE — Progress Notes (Signed)
Pre visit review using our clinic review tool, if applicable. No additional management support is needed unless otherwise documented below in the visit note. 

## 2015-05-06 ENCOUNTER — Telehealth: Payer: Self-pay | Admitting: Internal Medicine

## 2015-05-06 MED ORDER — AMPHETAMINE-DEXTROAMPHETAMINE 30 MG PO TABS: 30.0000 mg | ORAL_TABLET | Freq: Two times a day (BID) | ORAL | Status: DC

## 2015-05-06 NOTE — Telephone Encounter (Signed)
Pt requesting refill for amphetamine-dextroamphetamine (ADDERALL) 30 MG tablet [[id number]][[id number]]

## 2015-05-06 NOTE — Telephone Encounter (Signed)
Please advise, is this ok to refill?

## 2015-05-06 NOTE — Telephone Encounter (Signed)
Done hardcopy to Corinne  

## 2015-05-14 ENCOUNTER — Other Ambulatory Visit: Payer: Self-pay

## 2015-05-14 MED ORDER — AMPHETAMINE-DEXTROAMPHETAMINE 30 MG PO TABS: 30.0000 mg | ORAL_TABLET | Freq: Two times a day (BID) | ORAL | Status: DC

## 2015-05-14 NOTE — Telephone Encounter (Signed)
Patient picked prescription up.

## 2015-06-24 IMAGING — CR DG CHEST 2V
2 series · 2 of 2 positions shown · non-contrast
Comparison: DG THORACIC SPINE dated 12/09/2009; DG RIBS UNILATERAL
W/CHEST*L* dated 11/23/2006

CLINICAL DATA: CHEST PAIN

EXAM:
CHEST  2 VIEW

[w chest pa]
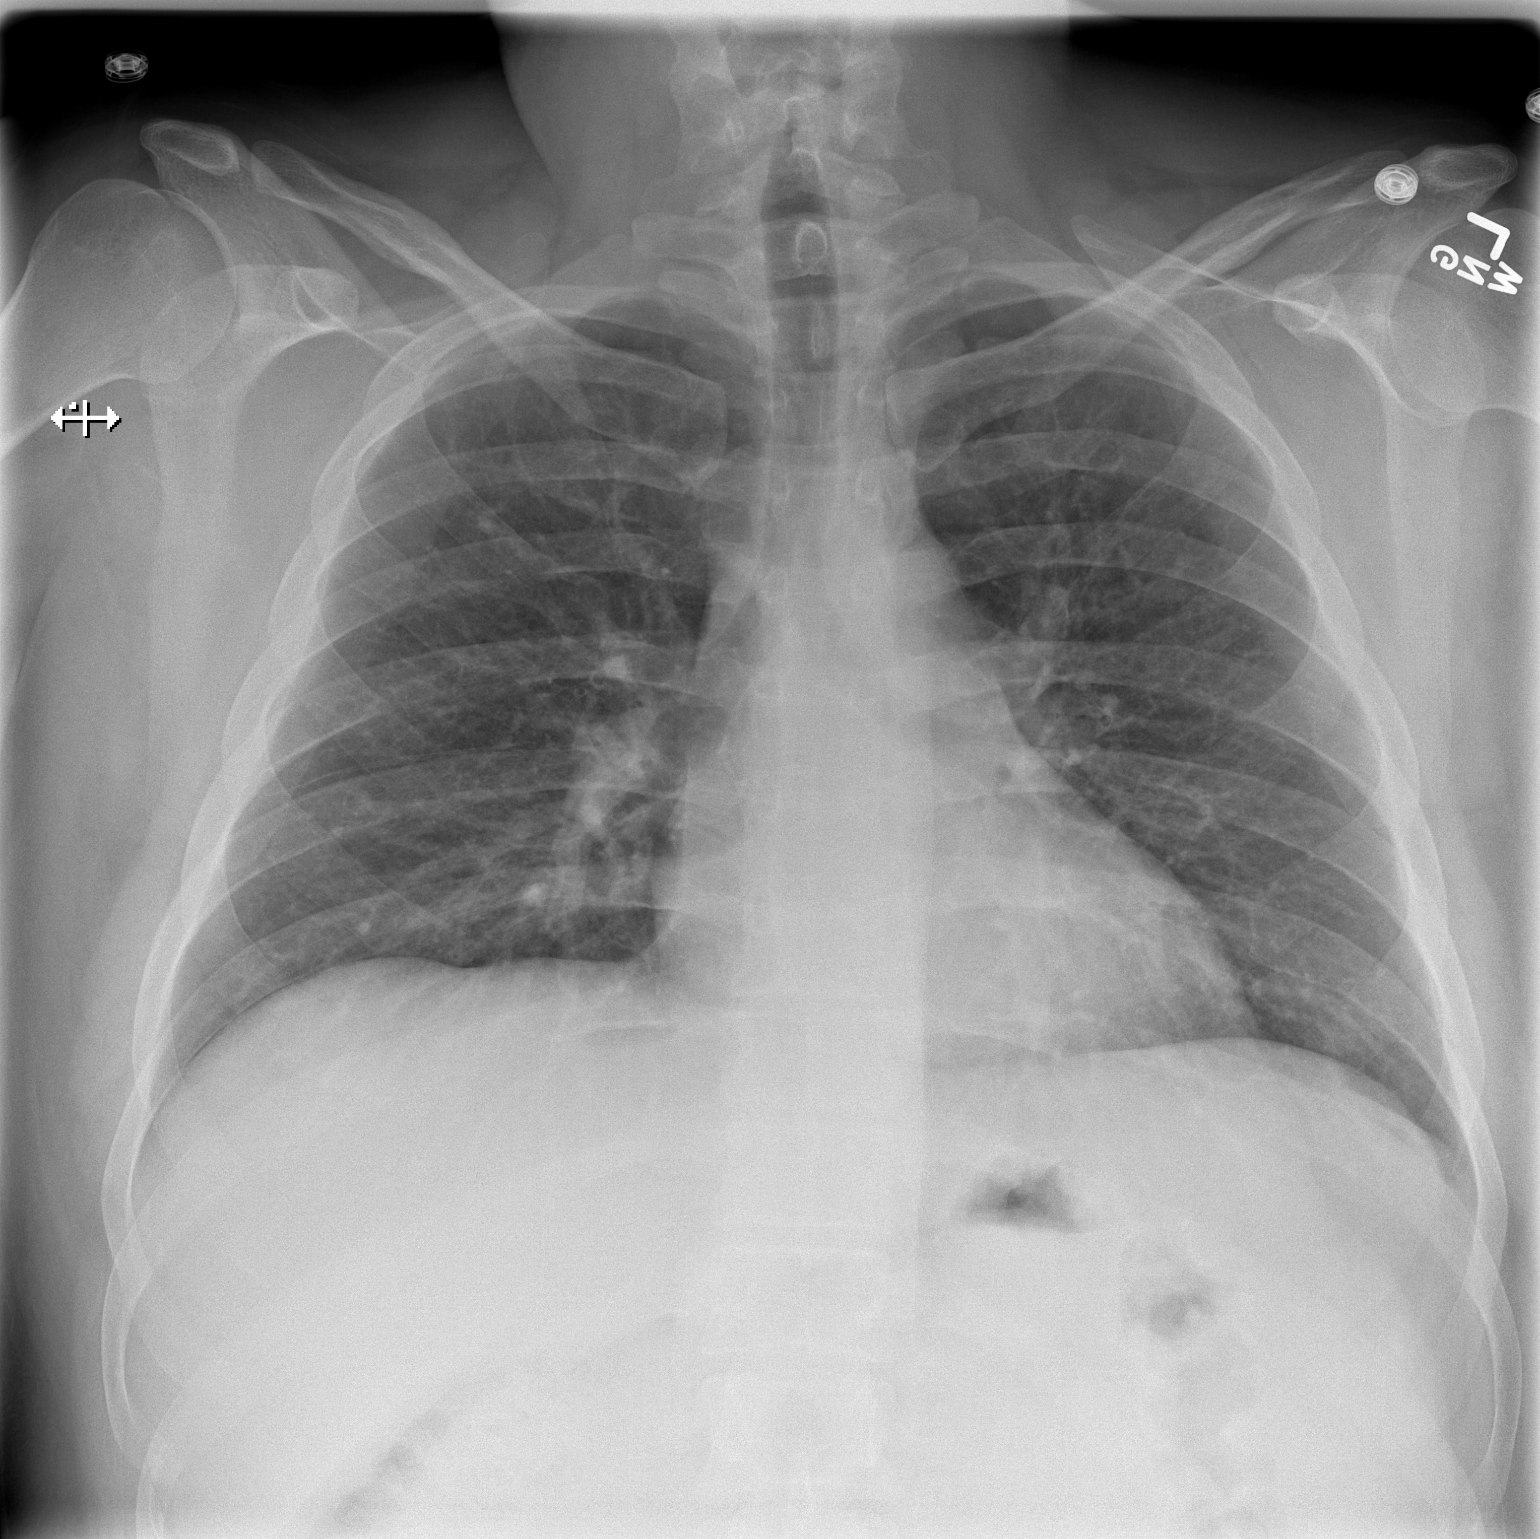

[w chest lat]
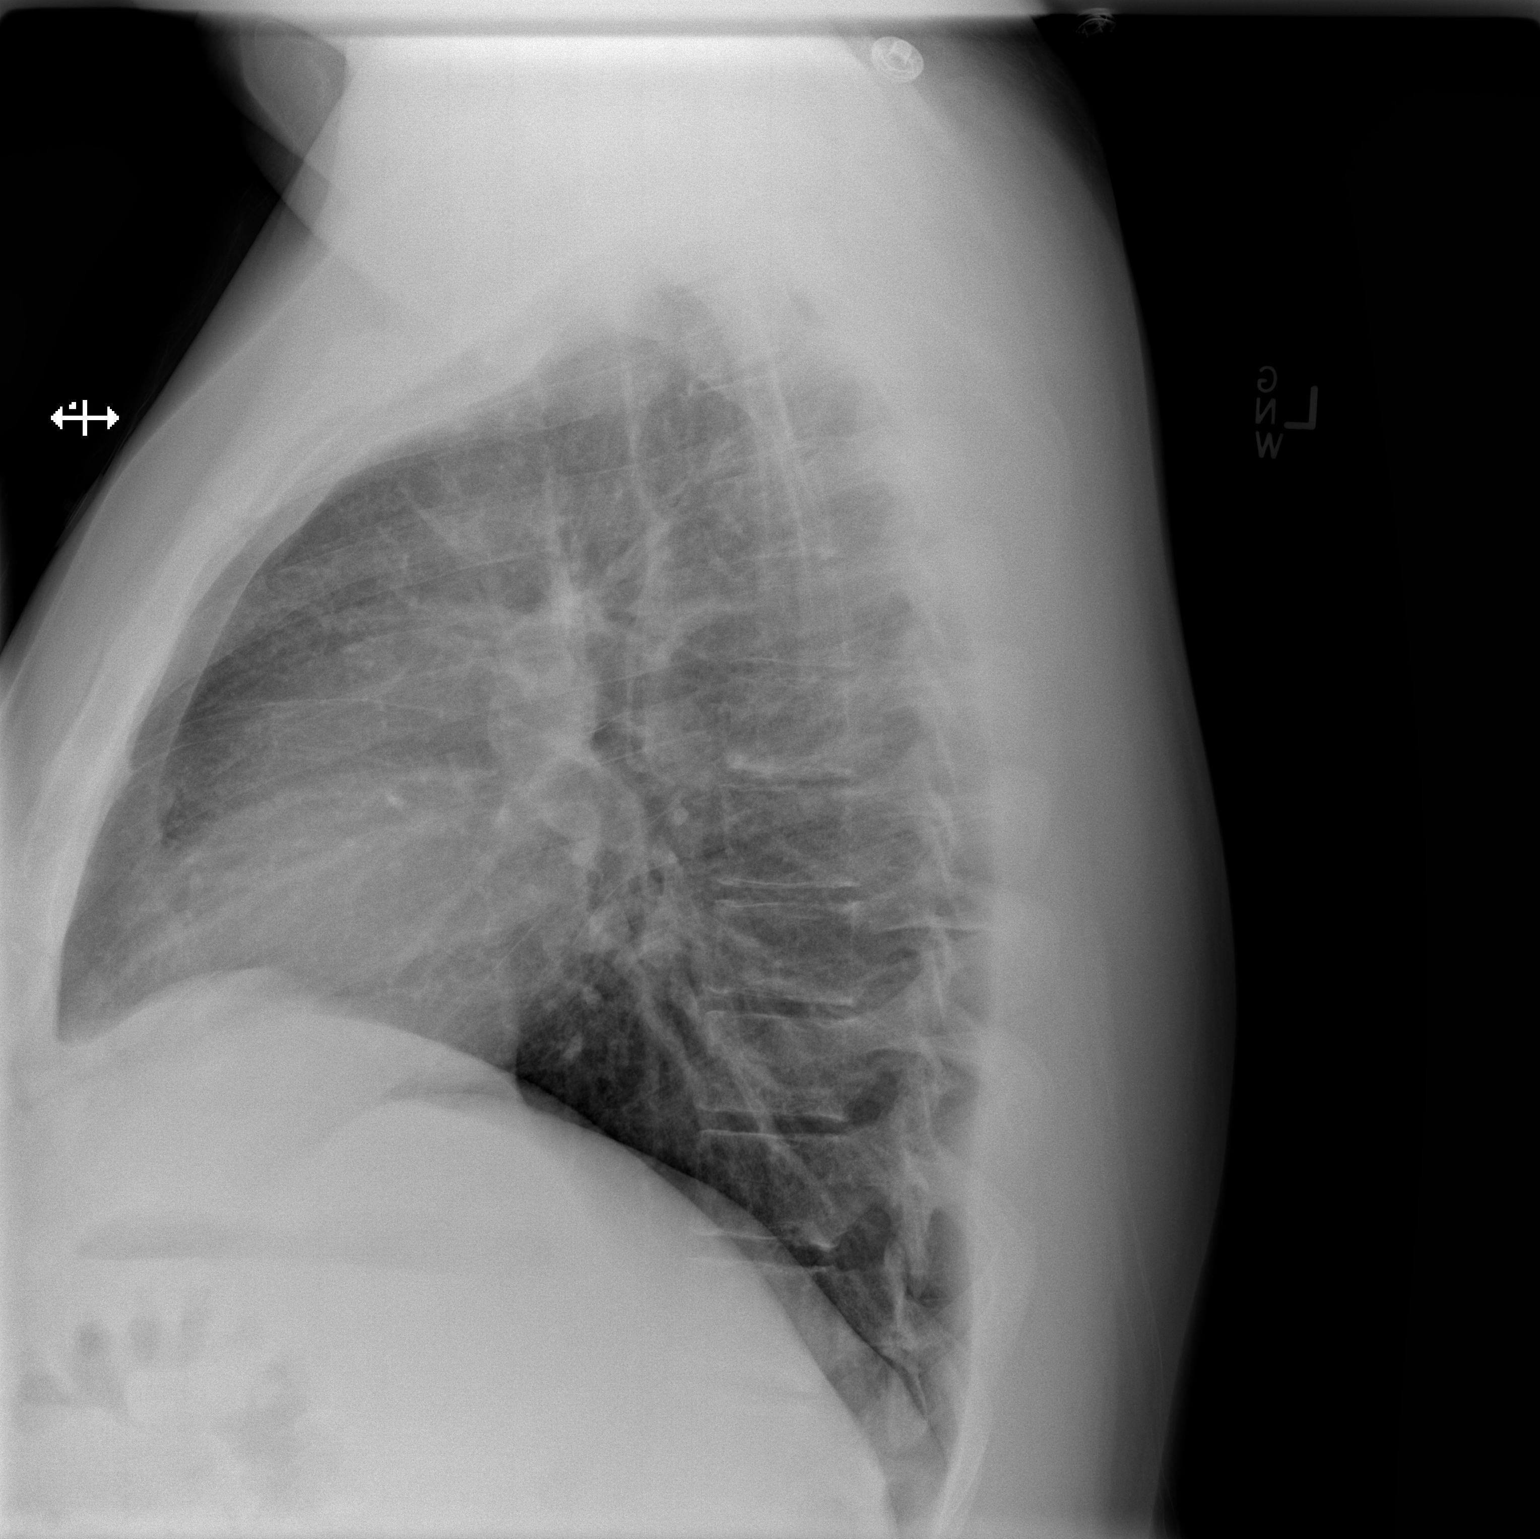

[2 of 2 positions shown; findings below may reference images not displayed]

FINDINGS: The heart size and mediastinal contours are within normal limits.
Both lungs are clear. The visualized skeletal structures are
unremarkable.
IMPRESSION: No active cardiopulmonary disease.

## 2015-06-26 ENCOUNTER — Telehealth: Payer: Self-pay | Admitting: *Deleted

## 2015-06-26 MED ORDER — AMPHETAMINE-DEXTROAMPHETAMINE 30 MG PO TABS: 30.0000 mg | ORAL_TABLET | Freq: Two times a day (BID) | ORAL | Status: DC

## 2015-06-26 NOTE — Telephone Encounter (Signed)
Done hardcopy to Corinne  

## 2015-06-26 NOTE — Telephone Encounter (Signed)
Received call pt requesting refill on his Adderrall...[name]/lmb

## 2015-06-27 NOTE — Telephone Encounter (Signed)
Called pt no answer LMOM rx ready for pick-up.../lmb 

## 2015-09-02 ENCOUNTER — Telehealth: Payer: Self-pay | Admitting: *Deleted

## 2015-09-02 MED ORDER — AMPHETAMINE-DEXTROAMPHETAMINE 30 MG PO TABS: 30.0000 mg | ORAL_TABLET | Freq: Two times a day (BID) | ORAL | Status: DC

## 2015-09-02 NOTE — Telephone Encounter (Signed)
Receive call pt requesting refills on his Adderrall...[name]/lmb

## 2015-09-02 NOTE — Telephone Encounter (Signed)
Notified pt rx ready for pick-up.../lmb 

## 2015-09-02 NOTE — Telephone Encounter (Signed)
Done hardcopy to Corinne  

## 2015-10-28 ENCOUNTER — Telehealth: Payer: Self-pay | Admitting: Internal Medicine

## 2015-10-28 MED ORDER — AMPHETAMINE-DEXTROAMPHETAMINE 30 MG PO TABS: 30.0000 mg | ORAL_TABLET | Freq: Two times a day (BID) | ORAL | 0 refills | Status: DC

## 2015-10-28 NOTE — Telephone Encounter (Signed)
Patient requesting fill of amphetamine-dextroamphetamine (ADDERALL) 30 MG tablet [[id number]][[id number]]

## 2015-10-28 NOTE — Telephone Encounter (Signed)
Done hardcopy to Corinne  

## 2015-10-28 NOTE — Telephone Encounter (Signed)
Patient aware.

## 2015-11-26 ENCOUNTER — Ambulatory Visit (INDEPENDENT_AMBULATORY_CARE_PROVIDER_SITE_OTHER): Payer: 59 | Admitting: Internal Medicine

## 2015-11-26 ENCOUNTER — Encounter: Payer: Self-pay | Admitting: Internal Medicine

## 2015-11-26 ENCOUNTER — Other Ambulatory Visit (INDEPENDENT_AMBULATORY_CARE_PROVIDER_SITE_OTHER): Payer: 59

## 2015-11-26 VITALS — BP 132/76 | HR 74 | Temp 98.0°F | Resp 20 | Wt 267.1 lb

## 2015-11-26 DIAGNOSIS — L408 Other psoriasis: Secondary | ICD-10-CM

## 2015-11-26 DIAGNOSIS — F909 Attention-deficit hyperactivity disorder, unspecified type: Secondary | ICD-10-CM | POA: Diagnosis not present

## 2015-11-26 DIAGNOSIS — Z87891 Personal history of nicotine dependence: Secondary | ICD-10-CM | POA: Diagnosis not present

## 2015-11-26 DIAGNOSIS — R635 Abnormal weight gain: Secondary | ICD-10-CM

## 2015-11-26 DIAGNOSIS — Z0001 Encounter for general adult medical examination with abnormal findings: Secondary | ICD-10-CM | POA: Diagnosis not present

## 2015-11-26 LAB — LIPID PANEL
CHOL/HDL RATIO: 4
Cholesterol: 157 mg/dL (ref 0–200)
HDL: 43.1 mg/dL (ref >39.00)
LDL CALC: 76 mg/dL (ref 0–99)
NonHDL: 114.25
TRIGLYCERIDES: 191 mg/dL — AB (ref 0.0–149.0)
VLDL: 38.2 mg/dL (ref 0.0–40.0)

## 2015-11-26 LAB — URINALYSIS, ROUTINE W REFLEX MICROSCOPIC
BILIRUBIN URINE: NEGATIVE
Hgb urine dipstick: NEGATIVE
Ketones, ur: NEGATIVE
LEUKOCYTES UA: NEGATIVE
Nitrite: NEGATIVE
PH: 7 (ref 5.0–8.0)
SPECIFIC GRAVITY, URINE: 1.015 (ref 1.000–1.030)
TOTAL PROTEIN, URINE-UPE24: NEGATIVE
Urine Glucose: NEGATIVE
Urobilinogen, UA: 0.2 (ref 0.0–1.0)

## 2015-11-26 LAB — CBC WITH DIFFERENTIAL/PLATELET
BASOS PCT: 0.5 % (ref 0.0–3.0)
Basophils Absolute: 0 10*3/uL (ref 0.0–0.1)
EOS ABS: 0.2 10*3/uL (ref 0.0–0.7)
EOS PCT: 2.3 % (ref 0.0–5.0)
HCT: 47.2 % (ref 39.0–52.0)
HEMOGLOBIN: 16.6 g/dL (ref 13.0–17.0)
LYMPHS ABS: 2.6 10*3/uL (ref 0.7–4.0)
Lymphocytes Relative: 26.3 % (ref 12.0–46.0)
MCHC: 35.2 g/dL (ref 30.0–36.0)
MCV: 90.9 fl (ref 78.0–100.0)
MONO ABS: 1 10*3/uL (ref 0.1–1.0)
Monocytes Relative: 9.7 % (ref 3.0–12.0)
NEUTROS ABS: 6 10*3/uL (ref 1.4–7.7)
NEUTROS PCT: 61.2 % (ref 43.0–77.0)
PLATELETS: 349 10*3/uL (ref 150.0–400.0)
RBC: 5.2 Mil/uL (ref 4.22–5.81)
RDW: 12.5 % (ref 11.5–15.5)
WBC: 9.8 10*3/uL (ref 4.0–10.5)

## 2015-11-26 LAB — BASIC METABOLIC PANEL
BUN: 12 mg/dL (ref 6–23)
CHLORIDE: 102 meq/L (ref 96–112)
CO2: 28 mEq/L (ref 19–32)
Calcium: 9.5 mg/dL (ref 8.4–10.5)
Creatinine, Ser: 0.8 mg/dL (ref 0.40–1.50)
GFR: 118.62 mL/min (ref >60.00)
Glucose, Bld: 92 mg/dL (ref 70–99)
POTASSIUM: 4.2 meq/L (ref 3.5–5.1)
SODIUM: 138 meq/L (ref 135–145)

## 2015-11-26 LAB — HEPATIC FUNCTION PANEL
ALK PHOS: 70 U/L (ref 39–117)
ALT: 37 U/L (ref 0–53)
AST: 22 U/L (ref 0–37)
Albumin: 4.2 g/dL (ref 3.5–5.2)
BILIRUBIN DIRECT: 0.1 mg/dL (ref 0.0–0.3)
BILIRUBIN TOTAL: 0.6 mg/dL (ref 0.2–1.2)
Total Protein: 7.1 g/dL (ref 6.0–8.3)

## 2015-11-26 LAB — TSH: TSH: 1.29 u[IU]/mL (ref 0.35–4.50)

## 2015-11-26 MED ORDER — AMPHETAMINE-DEXTROAMPHETAMINE 30 MG PO TABS: 30.0000 mg | ORAL_TABLET | Freq: Two times a day (BID) | ORAL | 0 refills | Status: DC

## 2015-11-26 NOTE — Progress Notes (Signed)
Pre visit review using our clinic review tool, if applicable. No additional management support is needed unless otherwise documented below in the visit note. 

## 2015-11-26 NOTE — Patient Instructions (Signed)
Please continue all other medications as before, and refills have been done if requested. - the adderall for 3 month  Please have the pharmacy call with any other refills you may need.  Please continue your efforts at being more active, low cholesterol diet, and weight control.  You are otherwise up to date with prevention measures today.  Please keep your appointments with your specialists as you may have planned  Please go to the LAB in the Basement (turn left off the elevator) for the tests to be done today  You will be contacted by phone if any changes need to be made immediately.  Otherwise, you will receive a letter about your results with an explanation, but please check with MyChart first.  Please remember to sign up for MyChart if you have not done so, as this will be important to you in the future with finding out test results, communicating by private email, and scheduling acute appointments online when needed.  Please return in 1 year for your yearly visit, or sooner if needed, with Lab testing done 3-5 days before

## 2015-11-26 NOTE — Progress Notes (Signed)
Subjective:    Patient ID: Isaiah Gibson, male    DOB: 28-Apr-1985, 30 y.o.   MRN: [id number]  HPI  Here for wellness and f/u;  Overall doing ok;  Pt denies Chest pain, worsening SOB, DOE, wheezing, orthopnea, PND, worsening LE edema, palpitations, dizziness or syncope.  Pt denies neurological change such as new headache, facial or extremity weakness.  Pt denies polydipsia, polyuria, or low sugar symptoms. Pt states overall good compliance with treatment and medications, good tolerability, and has been trying to follow appropriate diet.  Pt denies worsening depressive symptoms, suicidal ideation or panic. No fever, night sweats, wt loss, loss of appetite, or other constitutional symptoms.  Pt states good ability with ADL's, has low fall risk, home safety reviewed and adequate, no other significant changes in hearing or vision, and only occasionally active with exercise, was lifting wts before with muscle pull and tendonitis now improved, so now plans to restart training.    Has held off on taking the adderall as did not seem to have the effect he has had in the past, but now with new position will need to restart daily.    Quit smoking oct 2016, has not had to use inhalers but very rarely.  Has gained some wt recently Wt Readings from Last 3 Encounters:  11/26/15 267 lb 2 oz (121.2 kg)  04/22/15 252 lb (114.3 kg)  10/16/14 248 lb (112.5 kg)  Still with psoriatic rash to LLE extensive and small area right elbow, humira being switched to [employer] Past Medical History:  Diagnosis Date  . ADD 10/20/2006  . BACK PAIN 05/15/2007  . GERD 10/20/2006  . HEMATOCHEZIA 12/18/2009  . HYPERTENSION 01/25/2008  . INSOMNIA-SLEEP DISORDER-UNSPEC 05/15/2007  . Palpitations 01/25/2008  . POLYCYTHEMIA 05/20/2009  . PSORIASIS 10/20/2006  . SMOKER 05/15/2007  . VENEREAL WART 05/15/2007   No past surgical history on file.  reports that he quit smoking about 14 months ago. His smoking use included Cigarettes. He has never  used smokeless tobacco. He reports that he drinks alcohol. He reports that he does not use drugs. family history includes Hypertension in his other. No Known Allergies Current Outpatient Prescriptions on File Prior to Visit  Medication Sig Dispense Refill  . albuterol (PROVENTIL HFA;VENTOLIN HFA) 108 (90 BASE) MCG/ACT inhaler Inhale 1 puff into the lungs every 6 (six) hours as needed for wheezing or shortness of breath. 18 g 2  . Aspirin-Acetaminophen-Caffeine (GOODY HEADACHE PO) Take 1 packet by mouth daily as needed (pain).    . cyclobenzaprine (FLEXERIL) 5 MG tablet Take 1 tablet (5 mg total) by mouth 3 (three) times daily as needed for muscle spasms. 90 tablet 1  . Diclofenac Sodium (PENNSAID) 2 % SOLN Place 1 application onto the skin 2 (two) times daily as needed. 112 g 1  . ibuprofen (ADVIL,MOTRIN) 200 MG tablet Take 400 mg by mouth every 6 (six) hours as needed for moderate pain.    . nicotine (NICODERM CQ - DOSED IN MG/24 HOURS) 21 mg/24hr patch Place 21 mg onto the skin daily.    . traMADol (ULTRAM) 50 MG tablet TAKE 1 TABLET BY MOUTH EVERY 6 HOURS AS NEEDED FOR PAIN 60 tablet 1   No current facility-administered medications on file prior to visit.    Review of Systems Constitutional: Negative for increased diaphoresis, or other activity, appetite or siginficant weight change other than noted HENT: Negative for worsening hearing loss, ear pain, facial swelling, mouth sores and neck stiffness.   Eyes:  Negative for other worsening pain, redness or visual disturbance.  Respiratory: Negative for choking or stridor Cardiovascular: Negative for other chest pain and palpitations.  Gastrointestinal: Negative for worsening diarrhea, blood in stool, or abdominal distention Genitourinary: Negative for hematuria, flank pain or change in urine volume.  Musculoskeletal: Negative for myalgias or other joint complaints.  Skin: Negative for other color change and wound or drainage.  Neurological:  Negative for syncope and numbness. other than noted Hematological: Negative for adenopathy. or other swelling Psychiatric/Behavioral: Negative for hallucinations, SI, self-injury, decreased concentration or other worsening agitation.      Objective:   Physical Exam BP 132/76   Pulse 74   Temp 98 F (36.7 C) (Oral)   Resp 20   Wt 267 lb 2 oz (121.2 kg)   SpO2 97%   BMI 36.23 kg/m  VS noted,  Constitutional: Pt is oriented to person, place, and time. Appears well-developed and well-nourished, in no significant distress Head: Normocephalic and atraumatic  Eyes: Conjunctivae and EOM are normal. Pupils are equal, round, and reactive to light Right Ear: External ear normal.  Left Ear: External ear normal Nose: Nose normal.  Mouth/Throat: Oropharynx is clear and moist  Neck: Normal range of motion. Neck supple. No JVD present. No tracheal deviation present or significant neck LA or mass Cardiovascular: Normal rate, regular rhythm, normal heart sounds and intact distal pulses.   Pulmonary/Chest: Effort normal and breath sounds without rales or wheezing  Abdominal: Soft. Bowel sounds are normal. NT. No HSM  Musculoskeletal: Normal range of motion. Exhibits no edema Lymphadenopathy: Has no cervical adenopathy.  Neurological: Pt is alert and oriented to person, place, and time. Pt has normal reflexes. No cranial nerve deficit. Motor grossly intact Skin: Skin is warm and dry. Extensive LLE psoriatic rash noted, no new ulcers Psychiatric:  Has normal mood and affect. Behavior is normal.     Assessment & Plan:

## 2015-11-27 ENCOUNTER — Ambulatory Visit: Payer: 59 | Admitting: Internal Medicine

## 2015-11-29 NOTE — Assessment & Plan Note (Signed)
For increased activity, less calories, check tsh,  to f/u any worsening symptoms or concerns

## 2015-11-29 NOTE — Assessment & Plan Note (Signed)

## 2015-11-29 NOTE — Assessment & Plan Note (Signed)
Pt is for switch humira to taltz,  to f/u any worsening symptoms or concerns

## 2015-11-29 NOTE — Assessment & Plan Note (Signed)
Encourage pt to not take up again,  to f/u any worsening symptoms or concerns

## 2015-11-29 NOTE — Assessment & Plan Note (Addendum)
Stable, for re start adderall as before with recurrent symtpoms and new position at work  In addition to the time spent performing CPE, I spent an additional 15 minutes face to face,in which greater than 50% of this time was spent in counseling and coordination of care for patient's illness as documented.

## 2016-04-14 ENCOUNTER — Ambulatory Visit (INDEPENDENT_AMBULATORY_CARE_PROVIDER_SITE_OTHER): Payer: 59 | Admitting: Internal Medicine

## 2016-04-14 ENCOUNTER — Encounter: Payer: Self-pay | Admitting: Internal Medicine

## 2016-04-14 VITALS — BP 130/76 | HR 87 | Temp 98.3°F | Ht 72.0 in | Wt 268.0 lb

## 2016-04-14 DIAGNOSIS — F419 Anxiety disorder, unspecified: Secondary | ICD-10-CM

## 2016-04-14 DIAGNOSIS — F909 Attention-deficit hyperactivity disorder, unspecified type: Secondary | ICD-10-CM | POA: Diagnosis not present

## 2016-04-14 DIAGNOSIS — F410 Panic disorder [episodic paroxysmal anxiety] without agoraphobia: Secondary | ICD-10-CM | POA: Insufficient documentation

## 2016-04-14 MED ORDER — IXEKIZUMAB 80 MG/ML ~~LOC~~ SOAJ: 2.0000 mL | SUBCUTANEOUS | 0 refills | Status: DC

## 2016-04-14 MED ORDER — ESCITALOPRAM OXALATE 10 MG PO TABS: 10.0000 mg | ORAL_TABLET | Freq: Every day | ORAL | 3 refills | Status: DC

## 2016-04-14 MED ORDER — AMPHETAMINE-DEXTROAMPHETAMINE 30 MG PO TABS: 30.0000 mg | ORAL_TABLET | Freq: Two times a day (BID) | ORAL | 0 refills | Status: DC

## 2016-04-14 MED ORDER — AMPHETAMINE-DEXTROAMPHETAMINE 30 MG PO TABS
30.0000 mg | ORAL_TABLET | Freq: Two times a day (BID) | ORAL | 0 refills | Status: DC
Start: 2016-04-14 — End: 2016-07-27

## 2016-04-14 MED ORDER — AMPHETAMINE-DEXTROAMPHETAMINE 30 MG PO TABS
30.0000 mg | ORAL_TABLET | Freq: Two times a day (BID) | ORAL | 0 refills | Status: DC
Start: 2016-04-14 — End: 2016-04-14
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
## 2016-04-14 NOTE — Progress Notes (Signed)
Pre visit review using our clinic review tool, if applicable. No additional management support is needed unless otherwise documented below in the visit note. 

## 2016-04-14 NOTE — Patient Instructions (Signed)
Please take all new medication as prescribed - the lexapro 10 mg per day  Please continue all other medications as before, and refills have been done if requested - the adderall  Please have the pharmacy call with any other refills you may need.  Please continue your efforts at being more active, low cholesterol diet, and weight control.  You are otherwise up to date with prevention measures today.  Please keep your appointments with your specialists as you may have planned

## 2016-04-18 NOTE — Assessment & Plan Note (Signed)
Mod worsening with panic attacks, for lexapro 10 qd, declines referral for counseling

## 2016-04-18 NOTE — Assessment & Plan Note (Signed)
stable overall by history and exam, and pt to continue medical treatment as before,  to f/u any worsening symptoms or concerns 

## 2016-07-27 ENCOUNTER — Telehealth: Payer: Self-pay | Admitting: *Deleted

## 2016-07-27 MED ORDER — AMPHETAMINE-DEXTROAMPHETAMINE 30 MG PO TABS: 30.0000 mg | ORAL_TABLET | Freq: Two times a day (BID) | ORAL | 0 refills | Status: DC

## 2016-07-27 NOTE — Telephone Encounter (Signed)
Done hardcopy to Shirron  

## 2016-07-27 NOTE — Telephone Encounter (Signed)
Pt left msg on triage requesting renewal for his Adderrall...[name]/lmb

## 2016-07-28 NOTE — Telephone Encounter (Signed)
Informed pt Script at front desk  

## 2016-09-01 ENCOUNTER — Telehealth: Payer: Self-pay | Admitting: Internal Medicine

## 2016-09-01 MED ORDER — AMPHETAMINE-DEXTROAMPHETAMINE 30 MG PO TABS
30.0000 mg | ORAL_TABLET | Freq: Two times a day (BID) | ORAL | 0 refills | Status: DC
Start: 2016-09-01 — End: 2016-09-01

## 2016-09-01 MED ORDER — AMPHETAMINE-DEXTROAMPHETAMINE 30 MG PO TABS: 30.0000 mg | ORAL_TABLET | Freq: Two times a day (BID) | ORAL | 0 refills | Status: DC

## 2016-09-01 NOTE — Telephone Encounter (Signed)
Done hardcopy to Shirron  

## 2016-09-01 NOTE — Telephone Encounter (Signed)
Pt needs a refill of amphetamine-dextroamphetamine (ADDERALL) 30 MG tablet

## 2016-09-02 NOTE — Telephone Encounter (Signed)
Informed pt Scripts at front desk 

## 2016-12-06 ENCOUNTER — Telehealth: Payer: Self-pay | Admitting: Internal Medicine

## 2016-12-06 MED ORDER — AMPHETAMINE-DEXTROAMPHETAMINE 30 MG PO TABS: 30.0000 mg | ORAL_TABLET | Freq: Two times a day (BID) | ORAL | 0 refills | Status: DC

## 2016-12-06 NOTE — Telephone Encounter (Signed)
Pt called for a refill of his  amphetamine-dextroamphetamine (ADDERALL) 30 MG tablet  Please advise  Last seen 03/2016

## 2016-12-06 NOTE — Telephone Encounter (Signed)
Done hardcopy to Shirron  

## 2016-12-06 NOTE — Telephone Encounter (Signed)
Check [place] registry last filled 11/07/2016.Marland KitchenRaechel Gibson

## 2016-12-07 NOTE — Telephone Encounter (Addendum)
Pt informed Script at front desk 

## 2016-12-07 NOTE — Telephone Encounter (Signed)
This rx can not be faxed it is an control substances. Pt will need to pick rx up...lmb

## 2017-01-07 ENCOUNTER — Telehealth: Payer: Self-pay | Admitting: Internal Medicine

## 2017-01-07 MED ORDER — AMPHETAMINE-DEXTROAMPHETAMINE 30 MG PO TABS: 30.0000 mg | ORAL_TABLET | Freq: Two times a day (BID) | ORAL | 0 refills | Status: DC

## 2017-01-07 NOTE — Telephone Encounter (Signed)
Pt called requesting a refill on amphetamine-dextroamphetamine (ADDERALL) 30 MG tablet. Please advise.

## 2017-01-07 NOTE — Telephone Encounter (Signed)
Check [place] registry last filled 12/09/2016...[name]/lmb

## 2017-02-09 ENCOUNTER — Telehealth: Payer: Self-pay | Admitting: Internal Medicine

## 2017-02-09 NOTE — Telephone Encounter (Signed)
Copied from CRM [id number]. Topic: Quick Communication - Rx Refill/Question >> Feb 09, 2017  4:08 PM Viviann SpareWhite, Selina wrote: Has the patient contacted their pharmacy? Yes.   amphetamine-dextroamphetamine (ADDERALL) 30 MG tablet   (Agent: If no, request that the patient contact the pharmacy for the refill.)  Preferred Pharmacy (with phone number or street name):  [id number] [address], [place] - [phone] [facility] AT [facility] [address] [place] [id number] Phone: [phone] Fax: [phone]   Agent: Please be advised that RX refills may take up to 3 business days. We ask that you follow-up with your pharmacy.

## 2017-02-09 NOTE — Telephone Encounter (Signed)
Pt requesting Adderall. 

## 2017-02-10 MED ORDER — AMPHETAMINE-DEXTROAMPHETAMINE 30 MG PO TABS: 30.0000 mg | ORAL_TABLET | Freq: Two times a day (BID) | ORAL | 0 refills | Status: DC

## 2017-02-10 NOTE — Telephone Encounter (Signed)
Done erx 

## 2017-03-07 ENCOUNTER — Telehealth: Payer: Self-pay | Admitting: Internal Medicine

## 2017-03-07 MED ORDER — AMPHETAMINE-DEXTROAMPHETAMINE 30 MG PO TABS: 30.0000 mg | ORAL_TABLET | Freq: Two times a day (BID) | ORAL | 0 refills | Status: DC

## 2017-03-07 NOTE — Telephone Encounter (Signed)
Notified pt w/Md response. Made appt 04/05/17...[name]/lmb

## 2017-03-07 NOTE — Telephone Encounter (Signed)
Done erx  Please ask pt to make appt feb 2019 for further refills

## 2017-03-07 NOTE — Telephone Encounter (Signed)
Copied from CRM [id number]. Topic: Quick Communication - Rx Refill/Question >> Mar 07, 2017 11:57 AM Gerrianne ScalePayne, Veer Elamin L wrote: Medication: amphetamine-dextroamphetamine (ADDERALL) 30 MG tablet   Has the patient contacted their pharmacy? Yes.     (Agent: If no, request that the patient contact the pharmacy for the refill.)   Preferred Pharmacy (with phone number or street name): [id number] [address] [address]- [place], [place] - [address] [phone] (Phone) [phone] (Fax)     Agent: Please be advised that RX refills may take up to 3 business days. We ask that you follow-up with your pharmacy.

## 2017-04-05 ENCOUNTER — Encounter: Payer: 59 | Admitting: Internal Medicine

## 2017-04-05 DIAGNOSIS — Z0289 Encounter for other administrative examinations: Secondary | ICD-10-CM

## 2017-04-13 ENCOUNTER — Telehealth: Payer: Self-pay | Admitting: Internal Medicine

## 2017-04-13 MED ORDER — AMPHETAMINE-DEXTROAMPHETAMINE 30 MG PO TABS: 30.0000 mg | ORAL_TABLET | Freq: Two times a day (BID) | ORAL | 0 refills | Status: DC

## 2017-04-13 NOTE — Telephone Encounter (Signed)
Notified pt w/MD response.../lmb 

## 2017-04-13 NOTE — Telephone Encounter (Signed)
Ok for 1 month, but needs ROV for further refills

## 2017-04-13 NOTE — Telephone Encounter (Signed)
Can patient have a refill until this appointment on 2/27?

## 2017-04-13 NOTE — Telephone Encounter (Signed)
Copied from CRM [id number]. Topic: Quick Communication - See Telephone Encounter >> Apr 13, 2017  8:55 AM Herby AbrahamJohnson, Shiquita C wrote:    CRM for notification. See Telephone encounter for: pt called in because he says that he received a letter stating that he had an no show. Pt says that he was told that his apt was today 04/13/17, pt says that he took today off of work for apt. Not showing apt today. Pt says that he really need his medication. Pt's apt has been rescheduled until PCP next available which is next Wednesday 04/20/17. Pt would like to have further assistance.   04/13/17.

## 2017-04-20 ENCOUNTER — Ambulatory Visit (INDEPENDENT_AMBULATORY_CARE_PROVIDER_SITE_OTHER): Payer: Self-pay | Admitting: Internal Medicine

## 2017-04-20 ENCOUNTER — Encounter: Payer: Self-pay | Admitting: Internal Medicine

## 2017-04-20 VITALS — BP 130/80 | HR 93 | Temp 97.9°F | Ht 72.0 in | Wt 290.0 lb

## 2017-04-20 DIAGNOSIS — L408 Other psoriasis: Secondary | ICD-10-CM

## 2017-04-20 DIAGNOSIS — F419 Anxiety disorder, unspecified: Secondary | ICD-10-CM

## 2017-04-20 DIAGNOSIS — F909 Attention-deficit hyperactivity disorder, unspecified type: Secondary | ICD-10-CM

## 2017-04-20 DIAGNOSIS — Z0001 Encounter for general adult medical examination with abnormal findings: Secondary | ICD-10-CM

## 2017-04-20 DIAGNOSIS — Z111 Encounter for screening for respiratory tuberculosis: Secondary | ICD-10-CM

## 2017-04-20 NOTE — Patient Instructions (Addendum)
We will fill out the [facility] patient assistance   The TB skin test to be placed  Please continue all other medications as before, and refills have been done if requested.  Please have the pharmacy call with any other refills you may need.  Please continue your efforts at being more active, low cholesterol diet, and weight control.  Please keep your appointments with your specialists as you may have planned  Please return in 1 year for your yearly visit, or sooner if needed

## 2017-04-20 NOTE — Progress Notes (Signed)
Subjective:    Patient ID: Isaiah Gibson, male    DOB: Sep 29, 1985, 32 y.o.   MRN: [id number]  HPI  Here for wellness and f/u;  Overall doing ok;  Pt denies Chest pain, worsening SOB, DOE, wheezing, orthopnea, PND, worsening LE edema, palpitations, dizziness or syncope.  Pt denies neurological change such as new headache, facial or extremity weakness.  Pt denies polydipsia, polyuria, or low sugar symptoms. Pt states overall good compliance with treatment and medications, good tolerability, and has been trying to follow appropriate diet  No fever, night sweats, wt loss, loss of appetite, or other constitutional symptoms.  Pt states good ability with ADL's, has low fall risk, home safety reviewed and adequate, no other significant changes in hearing or vision, and only occasionally active with exercise.  Was taking taltz for psoriasis, left job end of aug 2018, and lost insurance coverage, ad TALTZ very expensive, now working with real estate, and psoriasis plaques are back to quite extensive to extensor services  Asking for pt assist program to get back to derm Denies worsening depressive symptoms, suicidal ideation, or panic; has ongoing anxiety, not increased recently.  No other interval change or new complaints except needs PPD placed in order to start the taltz, and f/u cbc Past Medical History:  Diagnosis Date  . ADD 10/20/2006  . BACK PAIN 05/15/2007  . GERD 10/20/2006  . HEMATOCHEZIA 12/18/2009  . HYPERTENSION 01/25/2008  . INSOMNIA-SLEEP DISORDER-UNSPEC 05/15/2007  . Palpitations 01/25/2008  . POLYCYTHEMIA 05/20/2009  . PSORIASIS 10/20/2006  . SMOKER 05/15/2007  . VENEREAL WART 05/15/2007   History reviewed. No pertinent surgical history.  reports that he quit smoking about 2 years ago. His smoking use included cigarettes. he has never used smokeless tobacco. He reports that he drinks alcohol. He reports that he does not use drugs. family history includes Hypertension in his other. No Known  Allergies Current Outpatient Medications on File Prior to Visit  Medication Sig Dispense Refill  . albuterol (PROVENTIL HFA;VENTOLIN HFA) 108 (90 BASE) MCG/ACT inhaler Inhale 1 puff into the lungs every 6 (six) hours as needed for wheezing or shortness of breath. 18 g 2  . amphetamine-dextroamphetamine (ADDERALL) 30 MG tablet Take 1 tablet by mouth 2 (two) times daily. T 60 tablet 0  . Aspirin-Acetaminophen-Caffeine (GOODY HEADACHE PO) Take 1 packet by mouth daily as needed (pain).    . cyclobenzaprine (FLEXERIL) 5 MG tablet Take 1 tablet (5 mg total) by mouth 3 (three) times daily as needed for muscle spasms. 90 tablet 1  . Diclofenac Sodium (PENNSAID) 2 % SOLN Place 1 application onto the skin 2 (two) times daily as needed. 112 g 1  . ibuprofen (ADVIL,MOTRIN) 200 MG tablet Take 400 mg by mouth every 6 (six) hours as needed for moderate pain.    . Ixekizumab (TALTZ) 80 MG/ML SOAJ Inject 2 mLs into the skin once a week. 1 mL 0  . traMADol (ULTRAM) 50 MG tablet TAKE 1 TABLET BY MOUTH EVERY 6 HOURS AS NEEDED FOR PAIN 60 tablet 1  . escitalopram (LEXAPRO) 10 MG tablet Take 1 tablet (10 mg total) by mouth daily. (Patient not taking: Reported on 04/20/2017) 90 tablet 3   No current facility-administered medications on file prior to visit.    Review of Systems Constitutional: Negative for other unusual diaphoresis, sweats, appetite or weight changes HENT: Negative for other worsening hearing loss, ear pain, facial swelling, mouth sores or neck stiffness.   Eyes: Negative for other worsening pain, redness  or other visual disturbance.  Respiratory: Negative for other stridor or swelling Cardiovascular: Negative for other palpitations or other chest pain  Gastrointestinal: Negative for worsening diarrhea or loose stools, blood in stool, distention or other pain Genitourinary: Negative for hematuria, flank pain or other change in urine volume.  Musculoskeletal: Negative for myalgias or other joint  swelling.  Skin: Negative for other color change, or other wound or worsening drainage.  Neurological: Negative for other syncope or numbness. Hematological: Negative for other adenopathy or swelling Psychiatric/Behavioral: Negative for hallucinations, other worsening agitation, SI, self-injury, or new decreased concentration All other system neg per pt    Objective:   Physical Exam BP 130/80 (BP Location: Left Arm, Patient Position: Sitting, Cuff Size: Large)   Pulse 93   Temp 97.9 F (36.6 C) (Oral)   Ht 6' (1.829 m)   Wt 290 lb (131.5 kg)   SpO2 97%   BMI 39.33 kg/m  VS noted, obese Constitutional: Pt is oriented to person, place, and time. Appears well-developed and well-nourished, in no significant distress and comfortable Head: Normocephalic and atraumatic  Eyes: Conjunctivae and EOM are normal. Pupils are equal, round, and reactive to light Right Ear: External ear normal without discharge Left Ear: External ear normal without discharge Nose: Nose without discharge or deformity Mouth/Throat: Oropharynx is without other ulcerations and moist  Neck: Normal range of motion. Neck supple. No JVD present. No tracheal deviation present or significant neck LA or mass Cardiovascular: Normal rate, regular rhythm, normal heart sounds and intact distal pulses.   Pulmonary/Chest: WOB normal and breath sounds without rales or wheezing  Abdominal: Soft. Bowel sounds are normal. NT. No HSM  Musculoskeletal: Normal range of motion. Exhibits no edema Lymphadenopathy: Has no other cervical adenopathy.  Neurological: Pt is alert and oriented to person, place, and time. Pt has normal reflexes. No cranial nerve deficit. Motor grossly intact, Gait intact Skin: Skin is warm and dry. No rash noted or new ulcerations Psychiatric:  Has normal mood and affect. Behavior is normal without agitation No other exam findings    Assessment & Plan:

## 2017-04-22 ENCOUNTER — Encounter: Payer: Self-pay | Admitting: Internal Medicine

## 2017-04-22 ENCOUNTER — Telehealth: Payer: Self-pay | Admitting: Internal Medicine

## 2017-04-22 LAB — TB SKIN TEST
INDURATION: 0 mm
TB SKIN TEST: NEGATIVE

## 2017-04-22 NOTE — Telephone Encounter (Signed)
Patient came in to get TB read.  Patient states this info was needed for the [facility] forms.  Please follow back up with patient in regard on Monday.

## 2017-04-23 DIAGNOSIS — Z111 Encounter for screening for respiratory tuberculosis: Secondary | ICD-10-CM | POA: Insufficient documentation

## 2017-04-23 NOTE — Assessment & Plan Note (Signed)
stable overall by history and exam, and pt to continue medical treatment as before,  to f/u any worsening symptoms or concerns 

## 2017-04-23 NOTE — Assessment & Plan Note (Signed)
Also for PPD placement 

## 2017-04-23 NOTE — Assessment & Plan Note (Addendum)
Ok for [employer] patient assist form to be filled out  In addition to the time spent performing CPE, I spent an additional 15 minutes face to face,in which greater than 50% of this time was spent in counseling and coordination of care for patient's illness as documented, including the differential dx, treatment, further evaluation and other management of psoriasis, anxiety, ADD and need for PPD

## 2017-04-23 NOTE — Assessment & Plan Note (Signed)

## 2017-04-25 NOTE — Telephone Encounter (Signed)
PCP has forms to be completed. Will add TB results once received from him to be faxed.

## 2017-04-27 ENCOUNTER — Other Ambulatory Visit: Payer: Self-pay | Admitting: Internal Medicine

## 2017-04-27 MED ORDER — IXEKIZUMAB 80 MG/ML ~~LOC~~ SOAJ: 2.0000 mL | SUBCUTANEOUS | 3 refills | Status: DC

## 2017-04-29 ENCOUNTER — Telehealth: Payer: Self-pay | Admitting: Internal Medicine

## 2017-04-29 NOTE — Telephone Encounter (Signed)
Forms for Lxekizumab have been completed & signed.  Patient has sections that need to be completed. I have called and informed him of this. He states he will come by and do so. Once he has done this we can fax the forms over.

## 2017-04-29 NOTE — Telephone Encounter (Signed)
Pt called and asked about the Rx for Ixekizumab (TALTZ) 80 MG/ML SOAJ [[id number]][[id number]]   pt states he was supposed to be called back about this but no one called, contact pt to advise

## 2017-05-03 NOTE — Telephone Encounter (Signed)
Forms have faxed over to [employer]. Copy sent to scan.

## 2017-05-06 ENCOUNTER — Other Ambulatory Visit: Payer: Self-pay | Admitting: Internal Medicine

## 2017-05-06 MED ORDER — AMPHETAMINE-DEXTROAMPHETAMINE 30 MG PO TABS: 30.0000 mg | ORAL_TABLET | Freq: Two times a day (BID) | ORAL | 0 refills | Status: DC

## 2017-05-06 NOTE — Telephone Encounter (Signed)
Pt not due until 05/18/17. Will hold until MD return back in the office Monday for approval.../lmb

## 2017-05-06 NOTE — Telephone Encounter (Signed)
Copied from CRM [phone]. Topic: Quick Communication - Rx Refill/Question >> May 06, 2017  9:24 AM Eston Mouldavis, Renette Hsu B wrote: Medication:amphetamine-dextroamphetamine (ADDERALL) 30 MG tablet   Has the patient contacted their pharmacy? yes   (Agent: If no, request that the patient contact the pharmacy for the refill.)   Preferred Pharmacy (with phone number or street name): [id number], [place] - [address] [place] [address] [phone])     Agent: Please be advised that RX refills may take up to 3 business days. We ask that you follow-up with your pharmacy.

## 2017-05-06 NOTE — Telephone Encounter (Signed)
Called pt no answer LMOM MD sent refill to pof../lmb 

## 2017-05-06 NOTE — Telephone Encounter (Signed)
Done erx 

## 2017-05-06 NOTE — Telephone Encounter (Signed)
LOV: 04/20/17  Dr. Jonny RuizJohn  [facility] [address]

## 2017-05-10 NOTE — Telephone Encounter (Signed)
Spoke with patient.  Date: 3/12 - 16:34 pages. 12 Fax # [phone]  He needed the information because it takes a week after this date to start processing the forms.

## 2017-05-10 NOTE — Telephone Encounter (Signed)
Patient called back and stated where on his forms he has zero income. He needs this in a letter form from us. Letter has been completed and faxed.   Patient also needed  Date: 3/19 - 1609 Pages 13 Fax # sent from: [phone]

## 2017-05-10 NOTE — Telephone Encounter (Signed)
Pt called -he is trying to get a head start on application  - he needs to know the date, time, and number of pages of the fax that was sent. And the fax number it came from.    cb for pt is [phone]

## 2017-05-25 NOTE — Telephone Encounter (Addendum)
Pages have been faxed again.   Date: 4/3 - 1129 Pages: 20 Fax # : [phone]  Patient has been informed.

## 2017-05-25 NOTE — Telephone Encounter (Signed)
[facility]. Please call back to patient once sent again, call back [phone]

## 2017-06-08 ENCOUNTER — Telehealth: Payer: Self-pay | Admitting: Internal Medicine

## 2017-06-08 MED ORDER — AMPHETAMINE-DEXTROAMPHETAMINE 30 MG PO TABS: 30.0000 mg | ORAL_TABLET | Freq: Two times a day (BID) | ORAL | 0 refills | Status: DC

## 2017-06-08 NOTE — Telephone Encounter (Signed)
Pt has bee informed and stated that he already told you during his OV that he didn't have insurance and could not afford to go to a dermatologist. He said "He prescribed the starter dose so what is the difference in prescribing this?" I restated to the patient as of right now Dr. Jonny RuizJohn has denied filling that medication and the best I could do is have another conversation with him to see if he will change his mind or reconsider.   Please advise.

## 2017-06-08 NOTE — Telephone Encounter (Signed)
I have had to reconsider this, and I think now it was a mistake for me to try to prescribe this medication as I felt sorry for him at his visit after discussion of his insurance limitations  Unfortuantley, this medication is not in my scope of practice and I am not certain of needs for follow up, or management of any side effects, and I would not feel comfortable with this

## 2017-06-08 NOTE — Telephone Encounter (Signed)
Pt was informed and expressed he wasted money to be seen here when he could have went elsewhere. He then asked for his Adderall to be refilled.  Last filled 05/11/2017 60#

## 2017-06-08 NOTE — Addendum Note (Signed)
Addended by: Corwin LevinsJOHN, JAMES W on: 06/08/2017 05:12 PM   Modules accepted: Orders

## 2017-06-08 NOTE — Telephone Encounter (Signed)
Done erx 

## 2017-06-08 NOTE — Telephone Encounter (Signed)
Pharmacy called needing clarification on  Ixekizumab (TALTZ) 80 MG/ML States 2mLs is not correct,  Amy call back number is [phone] Can speak to anyone who answers

## 2017-06-08 NOTE — Telephone Encounter (Signed)
I have reconsidered this request, and I am no longer comfortable with prescribing this as it falls outside my normal scope of practice  I can refer to Dermatology if he likes

## 2017-06-08 NOTE — Telephone Encounter (Signed)
Pt called as well to clarify this request. Pt states he was accepted into a program that lets him get this injection at a discounted price. Pt states the first Rx needs to be like a "starter"  2 injections, then an injection EOW (load up period) Then it will go to once a month.

## 2017-06-09 ENCOUNTER — Telehealth: Payer: Self-pay | Admitting: Internal Medicine

## 2017-06-09 NOTE — Telephone Encounter (Signed)
Copied from CRM [id number]. Topic: Quick Communication - See Telephone Encounter >> Jun 09, 2017 12:37 PM Landry MellowFoltz, Melissa J wrote: CRM for notification. See Telephone encounter for: 06/09/17. Pharm called about Ixekizumab (TALTZ) 80 MG/ML SOAJ  - pt states that he is in maintenance.  They want to clarify that t. Also, they want to confirm the dosing for maintenance, it is an unusual dose.  Kathlene NovemberMike, from [facility]  [phone]

## 2017-06-09 NOTE — Telephone Encounter (Signed)
Attempted x 2 to contact [facility] - got disconnected both times.

## 2017-06-09 NOTE — Telephone Encounter (Signed)
Attempted to reach Isaiah Gibson again. Unable to leave message or speak with someone.

## 2017-06-13 NOTE — Telephone Encounter (Signed)
As stated in previous telephone encounter PCP will not be prescribing this medication because it is out of his scope of practice.

## 2017-06-13 NOTE — Telephone Encounter (Signed)
Amy, RPH with [employer] [facility] called to ask for clarification, verbal, on Taltz 80mg /ml injection. She says normally a maintenance dose is 1 ml injection every 4 weeks and his order is listed as 2 ml injection once a week. I advised this would have to be sent to the provider for clarification and asked how does she want to be notified. She says: [phone] or [phone].

## 2017-06-29 ENCOUNTER — Other Ambulatory Visit: Payer: Self-pay | Admitting: Internal Medicine

## 2017-06-29 NOTE — Telephone Encounter (Signed)
Copied from CRM [id number]. Topic: Quick Communication - Rx Refill/Question >> Jun 29, 2017  2:18 PM Isaiah Gibson wrote: Medication:amphetamine-dextroamphetamine (ADDERALL) 30 MG tablet  Pt is asking if this can be filled early because he is leaving on the to go out of the counter on 5/18  Has the patient contacted their pharmacy? No   (Agent: If no, request that the patient contact the pharmacy for the refill.)   Preferred Pharmacy (with phone number or street name): [facility] [phone] [facility], [place] - [address] AT [place] [address] [phone] (Phone) [phone] (Fax)     Agent: Please be advised that RX refills may take up to 3 business days. We ask that you follow-up with your pharmacy.

## 2017-06-30 MED ORDER — AMPHETAMINE-DEXTROAMPHETAMINE 30 MG PO TABS: 30.0000 mg | ORAL_TABLET | Freq: Two times a day (BID) | ORAL | 0 refills | Status: DC

## 2017-06-30 NOTE — Telephone Encounter (Signed)
Done erx 

## 2017-06-30 NOTE — Telephone Encounter (Signed)
Pt requesting refill of Adderall, last filled on 06/08/17 #60. Pt asking if medication could be filled early because he is leaving to go out of the country on [place]18.   LOV: 04/20/17 Dr. [facility]ny Ruiz  [facility] [place]

## 2017-08-08 ENCOUNTER — Other Ambulatory Visit: Payer: Self-pay | Admitting: Internal Medicine

## 2017-08-08 MED ORDER — AMPHETAMINE-DEXTROAMPHETAMINE 30 MG PO TABS: 30.0000 mg | ORAL_TABLET | Freq: Two times a day (BID) | ORAL | 0 refills | Status: DC

## 2017-08-08 NOTE — Telephone Encounter (Signed)
Done erx 

## 2017-09-02 ENCOUNTER — Telehealth: Payer: Self-pay | Admitting: Internal Medicine

## 2017-09-02 MED ORDER — AMPHETAMINE-DEXTROAMPHETAMINE 30 MG PO TABS
30.0000 mg | ORAL_TABLET | Freq: Two times a day (BID) | ORAL | 0 refills | Status: DC
Start: 2017-09-02 — End: 2017-10-04

## 2017-09-02 NOTE — Telephone Encounter (Signed)
Pt informed

## 2017-09-02 NOTE — Telephone Encounter (Signed)
Copied from CRM [id number]. Topic: Quick Communication - Rx Refill/Question >> Sep 02, 2017 12:37 PM Leafy Roobinson, Isaiah Gibson wrote: Medication: generic adderall Has the patient contacted their pharmacy? No  Preferred Pharmacy (with phone number or street name): [facility] [address] Agent: Please be advised that RX refills may take up to 3 business days. We ask that you follow-up with your pharmacy.

## 2017-09-02 NOTE — Telephone Encounter (Signed)
Done erx 

## 2017-10-04 ENCOUNTER — Telehealth: Payer: Self-pay | Admitting: Internal Medicine

## 2017-10-04 MED ORDER — AMPHETAMINE-DEXTROAMPHETAMINE 30 MG PO TABS: 30.0000 mg | ORAL_TABLET | Freq: Two times a day (BID) | ORAL | 0 refills | Status: DC

## 2017-10-04 NOTE — Telephone Encounter (Signed)
Copied from CRM [id number]. Topic: Quick Communication - Rx Refill/Question >> Oct 04, 2017  3:00 PM Stephannie LiSimmons, Jakyle Petrucelli L, NT wrote: Medication:  amphetamine-dextroamphetamine (ADDERALL) 30 MG tablet   Has the patient contacted their pharmacy? yes  (Agent: If no, request that the patient contact the pharmacy for the refill (Agent: If yes, when and what did the pharmacy advise  Preferred Pharmacy (with phone number or street name [phone] [facility] - [address] [place] [phone] (Phone) [phone] (Fax)    Agent: Please be advised that RX refills may take up to 3 business days. We ask that you follow-up with your pharmacy.

## 2017-10-04 NOTE — Telephone Encounter (Signed)
Done erx 

## 2017-10-05 NOTE — Telephone Encounter (Signed)
MD approved and sent electronically to pof../lmb  

## 2017-10-31 ENCOUNTER — Telehealth: Payer: Self-pay | Admitting: Internal Medicine

## 2017-10-31 NOTE — Telephone Encounter (Signed)
Copied from CRM [id number]. Topic: Quick Communication - Rx Refill/Question >> Oct 31, 2017  3:50 PM Alexander Bergeron B wrote: Medication: amphetamine-dextroamphetamine (ADDERALL) 30 MG tablet [[id number]]   Has the patient contacted their pharmacy? Yes.   (Agent: If no, request that the patient contact the pharmacy for the refill.) (Agent: If yes, when and what did the pharmacy advise?)  [facility]  Agent: Please be advised that RX refills may take up to 3 business days. We ask that you follow-up with your pharmacy.

## 2017-11-01 MED ORDER — AMPHETAMINE-DEXTROAMPHETAMINE 30 MG PO TABS
30.0000 mg | ORAL_TABLET | Freq: Two times a day (BID) | ORAL | 0 refills | Status: DC
Start: 2017-11-01 — End: 2017-12-06

## 2017-11-01 NOTE — Telephone Encounter (Signed)
Done erx 

## 2017-11-01 NOTE — Telephone Encounter (Signed)
MD approved and sent electronically to pof../lmb  

## 2017-11-01 NOTE — Telephone Encounter (Signed)
Adderall 30 mg refill Last Refill:10/04/17 # 60 Last OV: 04/20/17 PCP: Jonny Ruiz [facility] [phone]

## 2017-12-06 ENCOUNTER — Telehealth: Payer: Self-pay

## 2017-12-06 MED ORDER — AMPHETAMINE-DEXTROAMPHETAMINE 30 MG PO TABS: 30.0000 mg | ORAL_TABLET | Freq: Two times a day (BID) | ORAL | 0 refills | Status: DC

## 2017-12-06 NOTE — Addendum Note (Signed)
Addended by: Corwin Levins on: 12/06/2017 12:58 PM   Modules accepted: Orders

## 2017-12-06 NOTE — Telephone Encounter (Signed)
Copied from CRM [id number]. Topic: Quick Communication - Rx Refill/Question >> Dec 06, 2017 10:57 AM Percival Spanish wrote: Medication  amphetamine-dextroamphetamine (ADDERALL) 30 MG tablet  [facility]   Agent: Please be advised that RX refills may take up to 3 business days. We ask that you follow-up with your pharmacy.

## 2017-12-06 NOTE — Telephone Encounter (Signed)
Done erx 

## 2018-01-04 ENCOUNTER — Other Ambulatory Visit: Payer: Self-pay | Admitting: Internal Medicine

## 2018-01-04 MED ORDER — AMPHETAMINE-DEXTROAMPHETAMINE 30 MG PO TABS: 30.0000 mg | ORAL_TABLET | Freq: Two times a day (BID) | ORAL | 0 refills | Status: DC

## 2018-01-04 NOTE — Telephone Encounter (Signed)
Copied from CRM [id number]. Topic: Quick Communication - Rx Refill/Question >> Jan 04, 2018  9:57 AM [name], [name] wrote: Medication: amphetamine-dextroamphetamine (ADDERALL) 30 MG tablet - pt states he has a couple days left - takes 2/day  Last OV 04/20/17 Dr. Jonny RuizJohn (advised to f/u 1 year) No future visit scheduled Last refill 12/06/17 #60  Has the patient contacted their pharmacy? No - controlled Preferred Pharmacy (with phone number or street name): [facility] [phone] [facility]- [place], [place] - [address] [place] [address] [phone] (Phone) [phone] (Fax)

## 2018-01-04 NOTE — Telephone Encounter (Signed)
Requested medication (s) are due for refill today: yes  Requested medication (s) are on the active medication list: yes    Last refill: 12/06/17  #60  0 refills  Future visit scheduled no  Notes to clinic:not delegated  Requested Prescriptions  Pending Prescriptions Disp Refills   amphetamine-dextroamphetamine (ADDERALL) 30 MG tablet 60 tablet 0    Sig: Take 1 tablet by mouth 2 (two) times daily.     Not Delegated - Psychiatry:  Stimulants/ADHD Failed - 01/04/2018 10:02 AM      Failed - This refill cannot be delegated      Failed - Urine Drug Screen completed in last 360 days.      Failed - Valid encounter within last 3 months    Recent Outpatient Visits          8 months ago Encounter for well adult exam with abnormal findings   [facility] [facility], James W, MD   1 year ago Anxiety   [facility] John, James W, MD   2 years ago Encounter for well adult exam with abnormal findings   [facility] John, James W, MD   2 years ago Pectoralis muscle strain, initial encounter   [facility] [place] Calone, Tama HeadingsGregory D, FNP   3 years ago Preventative health care   [facility] John, James W, MD

## 2018-01-04 NOTE — Telephone Encounter (Signed)
Done erx 

## 2018-02-02 ENCOUNTER — Other Ambulatory Visit: Payer: Self-pay | Admitting: Internal Medicine

## 2018-02-02 NOTE — Telephone Encounter (Signed)
Copied from CRM [id number]. Topic: Quick Communication - Rx Refill/Question >> Feb 02, 2018  2:52 PM Baldo DaubAlexander, Amber L wrote: Medication: amphetamine-dextroamphetamine (ADDERALL) 30 MG tablet  Has the patient contacted their pharmacy? No - advised pt that he can contact pharmacy directly in the future. (Agent: If no, request that the patient contact the pharmacy for the refill.) (Agent: If yes, when and what did the pharmacy advise?)  Preferred Pharmacy (with phone number or street name):[facility] [phone] [facility]- [place], [place] - [address] [place] [address] [phone] (Phone) [phone] (Fax)  Agent: Please be advised that RX refills may take up to 3 business days. We ask that you follow-up with your pharmacy.

## 2018-02-03 MED ORDER — AMPHETAMINE-DEXTROAMPHETAMINE 30 MG PO TABS: 30.0000 mg | ORAL_TABLET | Freq: Two times a day (BID) | ORAL | 0 refills | Status: DC

## 2018-02-03 NOTE — Telephone Encounter (Signed)
Done erx 

## 2018-02-28 ENCOUNTER — Other Ambulatory Visit: Payer: Self-pay | Admitting: Internal Medicine

## 2018-02-28 MED ORDER — AMPHETAMINE-DEXTROAMPHETAMINE 30 MG PO TABS
30.0000 mg | ORAL_TABLET | Freq: Two times a day (BID) | ORAL | 0 refills | Status: DC
Start: 2018-02-28 — End: 2018-04-06

## 2018-02-28 NOTE — Telephone Encounter (Signed)
Copied from CRM [id number]. Topic: Quick Communication - Rx Refill/Question >> Feb 28, 2018  3:02 PM Jilda Roche wrote: Medication: amphetamine-dextroamphetamine (ADDERALL) 30 MG tablet  Has the patient contacted their pharmacy? Yes.   (Agent: If no, request that the patient contact the pharmacy for the refill.) (Agent: If yes, when and what did the pharmacy advise?) Call office for refill  Preferred Pharmacy (with phone number or street name): [facility] [phone] [facility], [place] - [address] AT [place] [address] [phone] (Fax)    Agent: Please be advised that RX refills may take up to 3 business days. We ask that you follow-up with your pharmacy.

## 2018-02-28 NOTE — Telephone Encounter (Signed)
Done erx 

## 2018-04-06 ENCOUNTER — Telehealth: Payer: Self-pay | Admitting: Internal Medicine

## 2018-04-06 MED ORDER — AMPHETAMINE-DEXTROAMPHETAMINE 30 MG PO TABS: 30.0000 mg | ORAL_TABLET | Freq: Two times a day (BID) | ORAL | 0 refills | Status: DC

## 2018-04-06 NOTE — Telephone Encounter (Signed)
Copied from CRM #[id number]. Topic: Quick Communication - Rx Refill/Question >> Apr 06, 2018 10:04 AM Baldo DaubAlexander, Amber L wrote: Medication: amphetamine-dextroamphetamine (ADDERALL) 30 MG tablet  Has the patient contacted their pharmacy? Yes - pharmacy states that he can not request through pharmacy (Agent: If no, request that the patient contact the pharmacy for the refill.) (Agent: If yes, when and what did the pharmacy advise?)  Preferred Pharmacy (with phone number or street name): [facility] [phone] [facility]- [place] - [address] [place] [phone] (Phone) [phone])  Agent: Please be advised that RX refills may take up to 3 business days. We ask that you follow-up with your pharmacy.

## 2018-04-06 NOTE — Telephone Encounter (Signed)
Medication not delegated to NT to refill  

## 2018-04-06 NOTE — Telephone Encounter (Signed)
Please let pt know - due for yearly visit

## 2018-04-06 NOTE — Addendum Note (Signed)
Addended by: Corwin Levins on: 04/06/2018 05:26 PM   Modules accepted: Orders

## 2018-04-07 NOTE — Telephone Encounter (Signed)
Notified pt w/MD response. Made appt for 05/02/18.Marland KitchenRaechel Chute

## 2018-05-02 ENCOUNTER — Encounter: Payer: Self-pay | Admitting: Internal Medicine

## 2018-05-02 ENCOUNTER — Other Ambulatory Visit (INDEPENDENT_AMBULATORY_CARE_PROVIDER_SITE_OTHER): Payer: Self-pay

## 2018-05-02 ENCOUNTER — Ambulatory Visit (INDEPENDENT_AMBULATORY_CARE_PROVIDER_SITE_OTHER): Payer: Self-pay | Admitting: Internal Medicine

## 2018-05-02 VITALS — BP 122/84 | HR 94 | Temp 98.4°F | Ht 72.0 in | Wt 300.0 lb

## 2018-05-02 DIAGNOSIS — Z0000 Encounter for general adult medical examination without abnormal findings: Secondary | ICD-10-CM

## 2018-05-02 DIAGNOSIS — F909 Attention-deficit hyperactivity disorder, unspecified type: Secondary | ICD-10-CM

## 2018-05-02 LAB — URINALYSIS, ROUTINE W REFLEX MICROSCOPIC
Bilirubin Urine: NEGATIVE
Hgb urine dipstick: NEGATIVE
Ketones, ur: NEGATIVE
Leukocytes,Ua: NEGATIVE
Nitrite: NEGATIVE
PH: 7 (ref 5.0–8.0)
RBC / HPF: NONE SEEN (ref 0–?)
Specific Gravity, Urine: 1.02 (ref 1.000–1.030)
Total Protein, Urine: NEGATIVE
Urine Glucose: NEGATIVE
Urobilinogen, UA: 0.2 (ref 0.0–1.0)

## 2018-05-02 LAB — BASIC METABOLIC PANEL
BUN: 14 mg/dL (ref 6–23)
CO2: 26 mEq/L (ref 19–32)
Calcium: 9.7 mg/dL (ref 8.4–10.5)
Chloride: 102 mEq/L (ref 96–112)
Creatinine, Ser: 0.74 mg/dL (ref 0.40–1.50)
GFR: 120.34 mL/min (ref >60.00)
Glucose, Bld: 88 mg/dL (ref 70–99)
Potassium: 3.7 mEq/L (ref 3.5–5.1)
Sodium: 136 mEq/L (ref 135–145)

## 2018-05-02 LAB — CBC WITH DIFFERENTIAL/PLATELET
Basophils Absolute: 0.1 10*3/uL (ref 0.0–0.1)
Basophils Relative: 0.8 % (ref 0.0–3.0)
Eosinophils Absolute: 0.2 10*3/uL (ref 0.0–0.7)
Eosinophils Relative: 2.6 % (ref 0.0–5.0)
HCT: 45.7 % (ref 39.0–52.0)
Hemoglobin: 16 g/dL (ref 13.0–17.0)
LYMPHS PCT: 27.9 % (ref 12.0–46.0)
Lymphs Abs: 2.5 10*3/uL (ref 0.7–4.0)
MCHC: 35.1 g/dL (ref 30.0–36.0)
MCV: 88.4 fl (ref 78.0–100.0)
MONOS PCT: 7 % (ref 3.0–12.0)
Monocytes Absolute: 0.6 10*3/uL (ref 0.1–1.0)
Neutro Abs: 5.5 10*3/uL (ref 1.4–7.7)
Neutrophils Relative %: 61.7 % (ref 43.0–77.0)
Platelets: 352 10*3/uL (ref 150.0–400.0)
RBC: 5.17 Mil/uL (ref 4.22–5.81)
RDW: 13.2 % (ref 11.5–15.5)
WBC: 8.9 10*3/uL (ref 4.0–10.5)

## 2018-05-02 LAB — LIPID PANEL
Cholesterol: 180 mg/dL (ref 0–200)
HDL: 43.3 mg/dL (ref >39.00)
LDL Cholesterol: 97 mg/dL (ref 0–99)
NonHDL: 136.28
Total CHOL/HDL Ratio: 4
Triglycerides: 198 mg/dL — ABNORMAL HIGH (ref 0.0–149.0)
VLDL: 39.6 mg/dL (ref 0.0–40.0)

## 2018-05-02 LAB — HEPATIC FUNCTION PANEL
ALT: 43 U/L (ref 0–53)
AST: 28 U/L (ref 0–37)
Albumin: 4.4 g/dL (ref 3.5–5.2)
Alkaline Phosphatase: 82 U/L (ref 39–117)
Bilirubin, Direct: 0.1 mg/dL (ref 0.0–0.3)
TOTAL PROTEIN: 7.4 g/dL (ref 6.0–8.3)
Total Bilirubin: 0.5 mg/dL (ref 0.2–1.2)

## 2018-05-02 LAB — TSH: TSH: 2.37 u[IU]/mL (ref 0.35–4.50)

## 2018-05-02 MED ORDER — AMPHETAMINE-DEXTROAMPHETAMINE 30 MG PO TABS: 30.0000 mg | ORAL_TABLET | Freq: Two times a day (BID) | ORAL | 0 refills | Status: DC

## 2018-05-02 NOTE — Progress Notes (Signed)
Subjective:    Patient ID: IZEN LAPLUME, male    DOB: [birth year], 33 y.o.   MRN: [id number]  HPI  Here for wellness and f/u;  Overall doing ok;  Pt denies Chest pain, worsening SOB, DOE, wheezing, orthopnea, PND, worsening LE edema, palpitations, dizziness or syncope.  Pt denies neurological change such as new headache, facial or extremity weakness.  Pt denies polydipsia, polyuria, or low sugar symptoms. Pt states overall good compliance with treatment and medications, good tolerability, and has been trying to follow appropriate diet.  Pt denies worsening depressive symptoms, suicidal ideation or panic. No fever, night sweats, wt loss, loss of appetite, or other constitutional symptoms.  Pt states good ability with ADL's, has low fall risk, home safety reviewed and adequate, no other significant changes in hearing or vision, and only occasionally active with exercise.  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 Not ill today.  Gained 15 lbs in last 2 wks Wt Readings from Last 3 Encounters:  05/02/18 300 lb (136.1 kg)  04/20/17 290 lb (131.5 kg)  04/14/16 268 lb (121.6 kg)   Past Medical History:  Diagnosis Date  . ADD 10/20/2006  . BACK PAIN 05/15/2007  . GERD 10/20/2006  . HEMATOCHEZIA 12/18/2009  . HYPERTENSION 01/25/2008  . INSOMNIA-SLEEP DISORDER-UNSPEC 05/15/2007  . Palpitations 01/25/2008  . POLYCYTHEMIA 05/20/2009  . PSORIASIS 10/20/2006  . SMOKER 05/15/2007  . VENEREAL WART 05/15/2007   No past surgical history on file.  reports that he quit smoking about 3 years ago. His smoking use included cigarettes. He has never used smokeless tobacco. He reports current alcohol use. He reports that he does not use drugs. family history includes Hypertension in an other family member. No Known Allergies Current Outpatient Medications on File Prior to Visit  Medication Sig Dispense Refill  . albuterol (PROVENTIL HFA;VENTOLIN HFA) 108 (90 BASE) MCG/ACT  inhaler Inhale 1 puff into the lungs every 6 (six) hours as needed for wheezing or shortness of breath. 18 g 2  . amphetamine-dextroamphetamine (ADDERALL) 30 MG tablet Take 1 tablet by mouth 2 (two) times daily. To fill Apr 07, 2018 60 tablet 0  . Aspirin-Acetaminophen-Caffeine (GOODY HEADACHE PO) Take 1 packet by mouth daily as needed (pain).    . cyclobenzaprine (FLEXERIL) 5 MG tablet Take 1 tablet (5 mg total) by mouth 3 (three) times daily as needed for muscle spasms. 90 tablet 1  . Diclofenac Sodium (PENNSAID) 2 % SOLN Place 1 application onto the skin 2 (two) times daily as needed. 112 g 1  . ibuprofen (ADVIL,MOTRIN) 200 MG tablet Take 400 mg by mouth every 6 (six) hours as needed for moderate pain.    . Ixekizumab (TALTZ) 80 MG/ML SOAJ Inject 2 mLs into the skin once a week. 24 mL 3  . traMADol (ULTRAM) 50 MG tablet TAKE 1 TABLET BY MOUTH EVERY 6 HOURS AS NEEDED FOR PAIN 60 tablet 1   No current facility-administered medications on file prior to visit.    Review of Systems Constitutional: Negative for other unusual diaphoresis, sweats, appetite or weight changes HENT: Negative for other worsening hearing loss, ear pain, facial swelling, mouth sores or neck stiffness.   Eyes: Negative for other worsening pain, redness or other visual disturbance.  Respiratory: Negative for other stridor or swelling Cardiovascular: Negative for other palpitations or other chest pain  Gastrointestinal: Negative for worsening diarrhea or loose stools, blood in stool, distention or other pain Genitourinary: Negative  for hematuria, flank pain or other change in urine volume.  Musculoskeletal: Negative for myalgias or other joint swelling.  Skin: Negative for other color change, or other wound or worsening drainage.  Neurological: Negative for other syncope or numbness. Hematological: Negative for other adenopathy or swelling Psychiatric/Behavioral: Negative for hallucinations, other worsening agitation, SI,  self-injury, or new decreased concentration All other system neg per pt    Objective:   Physical Exam BP 122/84   Pulse 94   Temp 98.4 F (36.9 C) (Oral)   Ht 6' (1.829 m)   Wt 300 lb (136.1 kg)   SpO2 96%   BMI 40.69 kg/m  VS noted, morbid obese Constitutional: Pt appears in NAD HENT: Head: NCAT.  Right Ear: External ear normal.  Left Ear: External ear normal.  Eyes: . Pupils are equal, round, and reactive to light. Conjunctivae and EOM are normal Nose: without d/c or deformity Neck: Neck supple. Gross normal ROM Cardiovascular: Normal rate and regular rhythm.   Pulmonary/Chest: Effort normal and breath sounds without rales or wheezing.  Abd:  Soft, NT, ND, + BS, no organomegaly Neurological: Pt is alert. At baseline orientation, motor grossly intact Skin: Skin is warm. No rashes, other new lesions, no LE edema Psychiatric: Pt behavior is normal without agitation  No other exam findings Lab Results  Component Value Date   WBC 9.8 11/26/2015   HGB 16.6 11/26/2015   HCT 47.2 11/26/2015   PLT 349.0 11/26/2015   GLUCOSE 92 11/26/2015   CHOL 157 11/26/2015   TRIG 191.0 (H) 11/26/2015   HDL 43.10 11/26/2015   LDLCALC 76 11/26/2015   ALT 37 11/26/2015   AST 22 11/26/2015   NA 138 11/26/2015   K 4.2 11/26/2015   CL 102 11/26/2015   CREATININE 0.80 11/26/2015   BUN 12 11/26/2015   CO2 28 11/26/2015   TSH 1.29 11/26/2015       Assessment & Plan:

## 2018-05-02 NOTE — Patient Instructions (Signed)

## 2018-05-02 NOTE — Assessment & Plan Note (Signed)

## 2018-05-02 NOTE — Assessment & Plan Note (Signed)
stable overall by history and exam, recent data reviewed with pt, and pt to continue medical treatment as before,  to f/u any worsening symptoms or concerns  

## 2018-06-05 ENCOUNTER — Other Ambulatory Visit: Payer: Self-pay | Admitting: Internal Medicine

## 2018-06-06 MED ORDER — AMPHETAMINE-DEXTROAMPHETAMINE 30 MG PO TABS: 30.0000 mg | ORAL_TABLET | Freq: Two times a day (BID) | ORAL | 0 refills | Status: DC

## 2018-06-06 NOTE — Telephone Encounter (Signed)
Done erx 

## 2018-07-03 ENCOUNTER — Telehealth: Payer: Self-pay | Admitting: Internal Medicine

## 2018-07-03 MED ORDER — AMPHETAMINE-DEXTROAMPHETAMINE 30 MG PO TABS: 30.0000 mg | ORAL_TABLET | Freq: Two times a day (BID) | ORAL | 0 refills | Status: DC

## 2018-07-03 NOTE — Telephone Encounter (Signed)
Done erx 

## 2018-07-03 NOTE — Telephone Encounter (Signed)
Copied from CRM [id number]. Topic: Quick Communication - Rx Refill/Question >> Jul 03, 2018 10:53 AM Doreatha Massed wrote: Medication: Adderall 30 mg  Has the patient contacted their pharmacy? yes (Agent: If no, request that the patient contact the pharmacy for the refill.) (Agent: If yes, when and what did the pharmacy advise?)  Preferred Pharmacy (with phone number or [facility] [place]   Agent: Please be advised that RX refills may take up to 3 business days. We ask that you follow-up with your pharmacy.

## 2018-07-03 NOTE — Telephone Encounter (Signed)
[place] Controlled Database Checked Last filled: 06/06/18 # 60 LOV w/you: 05/02/18 Next appt w/you: 05/03/2019

## 2018-08-01 ENCOUNTER — Telehealth: Payer: Self-pay | Admitting: Internal Medicine

## 2018-08-01 MED ORDER — AMPHETAMINE-DEXTROAMPHETAMINE 30 MG PO TABS: 30.0000 mg | ORAL_TABLET | Freq: Two times a day (BID) | ORAL | 0 refills | Status: DC

## 2018-08-01 NOTE — Telephone Encounter (Signed)
Done erx 

## 2018-08-01 NOTE — Telephone Encounter (Unsigned)
Copied from [place] [id number]. Topic: Quick Communication - Rx Refill/Question >> Aug 01, 2018  1:32 PM Mcneil, Ja-Kwan wrote: Medication: amphetamine-dextroamphetamine (ADDERALL) 30 MG tablet  Has the patient contacted their pharmacy? no  Preferred Pharmacy (with phone number or street name): [id number] [place], [place] [phone] (Phone)  [phone] (Fax)  Agent: Please be advised that RX refills may take up to 3 business days. We ask that you follow-up with your pharmacy.

## 2018-08-29 ENCOUNTER — Telehealth: Payer: Self-pay | Admitting: Internal Medicine

## 2018-08-29 NOTE — Telephone Encounter (Signed)
Medication:amphetamine-dextroamphetamine (ADDERALL) 30 MG tablet [[id number]]   [id number] [place], [place] [phone] (Phone) [phone] (Fax)

## 2018-08-30 MED ORDER — AMPHETAMINE-DEXTROAMPHETAMINE 30 MG PO TABS: 30.0000 mg | ORAL_TABLET | Freq: Two times a day (BID) | ORAL | 0 refills | Status: DC

## 2018-08-30 NOTE — Telephone Encounter (Signed)
Done erx 

## 2018-10-02 ENCOUNTER — Telehealth: Payer: Self-pay | Admitting: Internal Medicine

## 2018-10-02 MED ORDER — AMPHETAMINE-DEXTROAMPHETAMINE 30 MG PO TABS: 30.0000 mg | ORAL_TABLET | Freq: Two times a day (BID) | ORAL | 0 refills | Status: DC

## 2018-10-02 NOTE — Telephone Encounter (Signed)
Done erx 

## 2018-10-02 NOTE — Telephone Encounter (Signed)
Medication Refill - Medication: amphetamine-dextroamphetamine (ADDERALL) 30 MG tablet  Has the patient contacted their pharmacy? no (Agent: If no, request that the patient contact the pharmacy for the refill.) (Agent: If yes, when and what did the pharmacy advise?)  Preferred Pharmacy (with phone number or street name):  [id number] [place], [place] [phone] (Phone) [phone] (Fax)   Agent: Please be advised that RX refills may take up to 3 business days. We ask that you follow-up with your pharmacy.

## 2018-10-04 ENCOUNTER — Other Ambulatory Visit: Payer: Self-pay | Admitting: Emergency Medicine

## 2018-10-04 MED ORDER — AMPHETAMINE-DEXTROAMPHETAMINE 30 MG PO TABS: 30.0000 mg | ORAL_TABLET | Freq: Two times a day (BID) | ORAL | 0 refills | Status: DC

## 2018-10-04 NOTE — Progress Notes (Signed)
Please resend RX, RX sent on 10/02/18 did not go through.

## 2018-10-04 NOTE — Progress Notes (Signed)
Done erx 

## 2018-11-01 ENCOUNTER — Other Ambulatory Visit: Payer: Self-pay | Admitting: Internal Medicine

## 2018-11-01 MED ORDER — AMPHETAMINE-DEXTROAMPHETAMINE 30 MG PO TABS: 30.0000 mg | ORAL_TABLET | Freq: Two times a day (BID) | ORAL | 0 refills | Status: DC

## 2018-11-01 NOTE — Telephone Encounter (Signed)
Requested medication (s) are due for refill today: yes  Requested medication (s) are on the active medication list: yes  Last refill:  10/04/2018  Future visit scheduled: yes  Notes to clinic:  This refill cannot be delegated   Requested Prescriptions  Pending Prescriptions Disp Refills   amphetamine-dextroamphetamine (ADDERALL) 30 MG tablet 60 tablet 0    Sig: Take 1 tablet by mouth 2 (two) times daily.     Not Delegated - Psychiatry:  Stimulants/ADHD Failed - 11/01/2018 11:06 AM      Failed - This refill cannot be delegated      Failed - Urine Drug Screen completed in last 360 days.      Failed - Valid encounter within last 3 months    Recent Outpatient Visits          6 months ago Preventative health care   [facility] -Georges Mouse, MD   1 year ago Encounter for well adult exam with abnormal findings   [place] John, James W, MD   2 years ago [facility] -Georges Mouse, MD   2 years ago Encounter for well adult exam with abnormal findings   [facility] -Georges Mouse, MD   3 years ago Pectoralis muscle strain, initial encounter   [place], FNP      Future Appointments            In 6 months Jenny Reichmann, Hunt Oris, MD [place], [facility]

## 2018-11-01 NOTE — Telephone Encounter (Signed)
Medication Refill - Medication: amphetamine-dextroamphetamine (ADDERALL) 30 MG tablet   Has the patient contacted their pharmacy? No. (Agent: If no, request that the patient contact the pharmacy for the refill.) (Agent: If yes, when and what did the pharmacy advise?)  Preferred Pharmacy (with phone number or street name):  [id number] [place], [place] [address]  Phone: [phone] Fax: [phone]  Not a 24 hour pharmacy; exact hours not known.     Agent: Please be advised that RX refills may take up to 3 business days. We ask that you follow-up with your pharmacy.

## 2018-11-01 NOTE — Telephone Encounter (Signed)
Done erx 

## 2018-12-01 ENCOUNTER — Other Ambulatory Visit: Payer: Self-pay | Admitting: Internal Medicine

## 2018-12-01 NOTE — Telephone Encounter (Signed)
Requested medication (s) are due for refill today: yes  Requested medication (s) are on the active medication list: yes  Last refill:  11/01/2018  Future visit scheduled: yes  Notes to clinic: refill cannot be delegated   Requested Prescriptions  Pending Prescriptions Disp Refills   amphetamine-dextroamphetamine (ADDERALL) 30 MG tablet 60 tablet 0    Sig: Take 1 tablet by mouth 2 (two) times daily.     Not Delegated - Psychiatry:  Stimulants/ADHD Failed - 12/01/2018  2:50 PM      Failed - This refill cannot be delegated      Failed - Urine Drug Screen completed in last 360 days.      Failed - Valid encounter within last 3 months    Recent Outpatient Visits          7 months ago Preventative health care   [facility] -Georges Mouse, MD   1 year ago Encounter for well adult exam with abnormal findings   [place] John, James W, MD   2 years ago [facility] -Georges Mouse, MD   3 years ago Encounter for well adult exam with abnormal findings   [facility] -Georges Mouse, MD   3 years ago Pectoralis muscle strain, initial encounter   [place], FNP      Future Appointments            In 5 months Jenny Reichmann, Hunt Oris, MD [place], [facility]

## 2018-12-01 NOTE — Telephone Encounter (Unsigned)
Copied from [place] [id number]. Topic: Quick Communication - Rx Refill/Question >> Dec 01, 2018  2:44 PM Yvette Rack wrote: Medication:  amphetamine-dextroamphetamine (ADDERALL) 30 MG tablet   Has the patient contacted their pharmacy? no  Preferred Pharmacy (with phone number or street name): [id number] [place], [place] [phone] (Phone)  [phone] (Fax)  Agent: Please be advised that RX refills may take up to 3 business days. We ask that you follow-up with your pharmacy.

## 2018-12-04 MED ORDER — AMPHETAMINE-DEXTROAMPHETAMINE 30 MG PO TABS: 30.0000 mg | ORAL_TABLET | Freq: Two times a day (BID) | ORAL | 0 refills | Status: DC

## 2018-12-04 NOTE — Telephone Encounter (Signed)
Done erx 

## 2019-01-02 ENCOUNTER — Telehealth: Payer: Self-pay | Admitting: Internal Medicine

## 2019-01-02 MED ORDER — AMPHETAMINE-DEXTROAMPHETAMINE 30 MG PO TABS: 30.0000 mg | ORAL_TABLET | Freq: Two times a day (BID) | ORAL | 0 refills | Status: DC

## 2019-01-02 NOTE — Telephone Encounter (Signed)
Medication Refill - Medication: adderall   Has the patient contacted their pharmacy? No. (Agent: If no, request that the patient contact the pharmacy for the refill.) (Agent: If yes, when and what did the pharmacy advise?)  Preferred Pharmacy (with phone number or street name):  [id number] [place], [place] [address]  Phone: [phone] Fax: [phone]  Not a 24 hour pharmacy; exact hours not known.     Agent: Please be advised that RX refills may take up to 3 business days. We ask that you follow-up with your pharmacy.

## 2019-01-02 NOTE — Telephone Encounter (Signed)
Done erx 

## 2019-01-29 ENCOUNTER — Telehealth: Payer: Self-pay | Admitting: Internal Medicine

## 2019-01-29 MED ORDER — AMPHETAMINE-DEXTROAMPHETAMINE 30 MG PO TABS: 30.0000 mg | ORAL_TABLET | Freq: Two times a day (BID) | ORAL | 0 refills | Status: DC

## 2019-01-29 NOTE — Telephone Encounter (Signed)
Done erx 

## 2019-01-29 NOTE — Telephone Encounter (Signed)
Medication Refill - Medication: amphetamine-dextroamphetamine (ADDERALL) 30 MG tablet    Preferred Pharmacy (with phone number or street name):  [id number] [place], [place] [phone] (Phone) [phone] (Fax)     Agent: Please be advised that RX refills may take up to 3 business days. We ask that you follow-up with your pharmacy.

## 2019-02-27 ENCOUNTER — Other Ambulatory Visit: Payer: Self-pay | Admitting: Internal Medicine

## 2019-02-27 MED ORDER — AMPHETAMINE-DEXTROAMPHETAMINE 30 MG PO TABS: 30.0000 mg | ORAL_TABLET | Freq: Two times a day (BID) | ORAL | 0 refills | Status: DC

## 2019-02-27 NOTE — Telephone Encounter (Signed)
Requested medication (s) are due for refill today: yes  Requested medication (s) are on the active medication list: yes  Last refill:  01/29/2019  Future visit scheduled: yes  Notes to clinic:  This refill cannot be delegated    Requested Prescriptions  Pending Prescriptions Disp Refills   amphetamine-dextroamphetamine (ADDERALL) 30 MG tablet 60 tablet 0    Sig: Take 1 tablet by mouth 2 (two) times daily.      Not Delegated - Psychiatry:  Stimulants/ADHD Failed - 02/27/2019 12:18 PM      Failed - This refill cannot be delegated      Failed - Urine Drug Screen completed in last 360 days.      Failed - Valid encounter within last 3 months    Recent Outpatient Visits           10 months ago Preventative health care   [facility] -Clair Gulling, MD   1 year ago Encounter for well adult exam with abnormal findings   [facility] -Clair Gulling, MD   2 years ago Anxiety   [facility] -Clair Gulling, MD   3 years ago Encounter for well adult exam with abnormal findings   [facility] -Clair Gulling, MD   3 years ago Pectoralis muscle strain, initial encounter   [facility] -Olive Bass, FNP       Future Appointments             In 1 week Jonny Ruiz Len Blalock, MD [facility]

## 2019-02-27 NOTE — Telephone Encounter (Signed)
Copied from CRM [id number]. Topic: Quick Communication - Rx Refill/Question >> Feb 27, 2019 11:52 AM Jaquita Rector A wrote: Medication: amphetamine-dextroamphetamine (ADDERALL) 30 MG tablet   Has the patient contacted their pharmacy? Yes.   (Agent: If no, request that the patient contact the pharmacy for the refill.) (Agent: If yes, when and what did the pharmacy advise?)  Preferred Pharmacy (with phone number or street name): [id number] [place], [place] - [address] AT [place] [address]  Phone:  [phone] Fax:  [phone]     Agent: Please be advised that RX refills may take up to 3 business days. We ask that you follow-up with your pharmacy.

## 2019-02-27 NOTE — Telephone Encounter (Signed)
Done erx 

## 2019-03-07 ENCOUNTER — Other Ambulatory Visit: Payer: Self-pay

## 2019-03-07 ENCOUNTER — Encounter: Payer: Self-pay | Admitting: Internal Medicine

## 2019-03-07 ENCOUNTER — Ambulatory Visit (INDEPENDENT_AMBULATORY_CARE_PROVIDER_SITE_OTHER): Payer: Self-pay | Admitting: Internal Medicine

## 2019-03-07 VITALS — BP 120/82 | HR 114 | Temp 98.9°F | Ht 72.0 in | Wt 269.0 lb

## 2019-03-07 DIAGNOSIS — Z0000 Encounter for general adult medical examination without abnormal findings: Secondary | ICD-10-CM

## 2019-03-07 LAB — LIPID PANEL
Cholesterol: 156 mg/dL (ref 0–200)
HDL: 42.9 mg/dL (ref >39.00)
LDL Cholesterol: 86 mg/dL (ref 0–99)
NonHDL: 113.4
Total CHOL/HDL Ratio: 4
Triglycerides: 138 mg/dL (ref 0.0–149.0)
VLDL: 27.6 mg/dL (ref 0.0–40.0)

## 2019-03-07 LAB — CBC WITH DIFFERENTIAL/PLATELET
Basophils Absolute: 0.1 10*3/uL (ref 0.0–0.1)
Basophils Relative: 0.7 % (ref 0.0–3.0)
Eosinophils Absolute: 0.1 10*3/uL (ref 0.0–0.7)
Eosinophils Relative: 1 % (ref 0.0–5.0)
HCT: 52 % (ref 39.0–52.0)
Hemoglobin: 17.8 g/dL — ABNORMAL HIGH (ref 13.0–17.0)
Lymphocytes Relative: 19.2 % (ref 12.0–46.0)
Lymphs Abs: 2.4 10*3/uL (ref 0.7–4.0)
MCHC: 34.3 g/dL (ref 30.0–36.0)
MCV: 91.9 fl (ref 78.0–100.0)
Monocytes Absolute: 0.9 10*3/uL (ref 0.1–1.0)
Monocytes Relative: 7 % (ref 3.0–12.0)
Neutro Abs: 9.1 10*3/uL — ABNORMAL HIGH (ref 1.4–7.7)
Neutrophils Relative %: 72.1 % (ref 43.0–77.0)
Platelets: 418 10*3/uL — ABNORMAL HIGH (ref 150.0–400.0)
RBC: 5.66 Mil/uL (ref 4.22–5.81)
RDW: 13.3 % (ref 11.5–15.5)
WBC: 12.6 10*3/uL — ABNORMAL HIGH (ref 4.0–10.5)

## 2019-03-07 LAB — HEPATIC FUNCTION PANEL
ALT: 26 U/L (ref 0–53)
AST: 17 U/L (ref 0–37)
Albumin: 4.6 g/dL (ref 3.5–5.2)
Alkaline Phosphatase: 76 U/L (ref 39–117)
Bilirubin, Direct: 0.1 mg/dL (ref 0.0–0.3)
Total Bilirubin: 0.6 mg/dL (ref 0.2–1.2)
Total Protein: 7.6 g/dL (ref 6.0–8.3)

## 2019-03-07 LAB — BASIC METABOLIC PANEL
BUN: 10 mg/dL (ref 6–23)
CO2: 25 mEq/L (ref 19–32)
Calcium: 9.9 mg/dL (ref 8.4–10.5)
Chloride: 102 mEq/L (ref 96–112)
Creatinine, Ser: 0.86 mg/dL (ref 0.40–1.50)
GFR: 100.69 mL/min (ref >60.00)
Glucose, Bld: 95 mg/dL (ref 70–99)
Potassium: 3.8 mEq/L (ref 3.5–5.1)
Sodium: 137 mEq/L (ref 135–145)

## 2019-03-07 LAB — TSH: TSH: 1.31 u[IU]/mL (ref 0.35–4.50)

## 2019-03-07 NOTE — Progress Notes (Signed)
Subjective:    Patient ID: Isaiah Gibson, male    DOB: [birth year], 34 y.o.   MRN: [id number]  HPI  Here for wellness and f/u;  Overall doing ok;  Pt denies Chest pain, worsening SOB, DOE, wheezing, orthopnea, PND, worsening LE edema, palpitations, dizziness or syncope.  Pt denies neurological change such as new headache, facial or extremity weakness.  Pt denies polydipsia, polyuria, or low sugar symptoms. Pt states overall good compliance with treatment and medications, good tolerability, and has been trying to follow appropriate diet.  Pt denies worsening depressive symptoms, suicidal ideation or panic. No fever, night sweats, wt loss, loss of appetite, or other constitutional symptoms.  Pt states good ability with ADL's, has low fall risk, home safety reviewed and adequate, no other significant changes in hearing or vision, and only occasionally active with exercise. Peak wt 297 now down to 264 at home with keto diet x 3 mo.  Past Medical History:  Diagnosis Date  . ADD 10/20/2006  . BACK PAIN 05/15/2007  . GERD 10/20/2006  . HEMATOCHEZIA 12/18/2009  . HYPERTENSION 01/25/2008  . INSOMNIA-SLEEP DISORDER-UNSPEC 05/15/2007  . Palpitations 01/25/2008  . POLYCYTHEMIA 05/20/2009  . PSORIASIS 10/20/2006  . SMOKER 05/15/2007  . VENEREAL WART 05/15/2007   History reviewed. No pertinent surgical history.  reports that he quit smoking about 4 years ago. His smoking use included cigarettes. He has never used smokeless tobacco. He reports current alcohol use. He reports that he does not use drugs. family history includes Hypertension in an other family member. No Known Allergies Current Outpatient Medications on File Prior to Visit  Medication Sig Dispense Refill  . amphetamine-dextroamphetamine (ADDERALL) 30 MG tablet Take 1 tablet by mouth 2 (two) times daily. 60 tablet 0  . Aspirin-Acetaminophen-Caffeine (GOODY HEADACHE PO) Take 1 packet by mouth daily as needed (pain).    . cyclobenzaprine (FLEXERIL) 5 MG  tablet Take 1 tablet (5 mg total) by mouth 3 (three) times daily as needed for muscle spasms. 90 tablet 1  . ibuprofen (ADVIL,MOTRIN) 200 MG tablet Take 400 mg by mouth every 6 (six) hours as needed for moderate pain.    . Risankizumab-rzaa,150 MG Dose, (SKYRIZI, 150 MG DOSE,) 75 MG/0.83ML PSKT Inject 1 each into the skin every 3 (three) months.    [name] albuterol (PROVENTIL HFA;VENTOLIN HFA) 108 (90 BASE) MCG/ACT inhaler Inhale 1 puff into the lungs every 6 (six) hours as needed for wheezing or shortness of breath. (Patient not taking: Reported on 03/07/2019) 18 g 2  . Diclofenac Sodium (PENNSAID) 2 % SOLN Place 1 application onto the skin 2 (two) times daily as needed. (Patient not taking: Reported on 03/07/2019) 112 g 1  . traMADol (ULTRAM) 50 MG tablet TAKE 1 TABLET BY MOUTH EVERY 6 HOURS AS NEEDED FOR PAIN (Patient not taking: Reported on 03/07/2019) 60 tablet 1   No current facility-administered medications on file prior to visit.   Review of Systems  Constitutional: Negative for other unusual diaphoresis or sweats HENT: Negative for ear discharge or swelling Eyes: Negative for other worsening visual disturbances Respiratory: Negative for stridor or other swelling  Gastrointestinal: Negative for worsening distension or other blood Genitourinary: Negative for retention or other urinary change Musculoskeletal: Negative for other MSK pain or swelling Skin: Negative for color change or other new lesions Neurological: Negative for worsening tremors and other numbness  Psychiatric/Behavioral: Negative for worsening agitation or other fatigue All otherwise neg per pt     Objective:   Physical Exam BP  120/82 (BP Location: Left Arm, Patient Position: Sitting, Cuff Size: Large)   Pulse (!) 114   Temp 98.9 F (37.2 C) (Oral)   Ht 6' (1.829 m)   Wt 269 lb (122 kg)   SpO2 95%   BMI 36.48 kg/m  VS noted,  Constitutional: Pt appears in NAD HENT: Head: NCAT.  Right Ear: External ear normal.   Left Ear: External ear normal.  Eyes: . Pupils are equal, round, and reactive to light. Conjunctivae and EOM are normal Nose: without d/c or deformity Neck: Neck supple. Gross normal ROM Cardiovascular: Normal rate and regular rhythm.   Pulmonary/Chest: Effort normal and breath sounds without rales or wheezing.  Abd:  Soft, NT, ND, + BS, no organomegaly Neurological: Pt is alert. At baseline orientation, motor grossly intact Skin: Skin is warm. No rashes, other new lesions, no LE edema Psychiatric: Pt behavior is normal without agitation  All otherwise neg per pt Lab Results  Component Value Date   WBC 8.9 05/02/2018   HGB 16.0 05/02/2018   HCT 45.7 05/02/2018   PLT 352.0 05/02/2018   GLUCOSE 88 05/02/2018   CHOL 180 05/02/2018   TRIG 198.0 (H) 05/02/2018   HDL 43.30 05/02/2018   LDLCALC 97 05/02/2018   ALT 43 05/02/2018   AST 28 05/02/2018   NA 136 05/02/2018   K 3.7 05/02/2018   CL 102 05/02/2018   CREATININE 0.74 05/02/2018   BUN 14 05/02/2018   CO2 26 05/02/2018   TSH 2.37 05/02/2018      Assessment & Plan:

## 2019-03-07 NOTE — Patient Instructions (Signed)
Please continue all other medications as before, and refills have been done if requested.  Please have the pharmacy call with any other refills you may need.  Please continue your efforts at being more active, low cholesterol diet, and weight control.  You are otherwise up to date with prevention measures today.  Please keep your appointments with your specialists as you may have planned  Please go to the LAB at the blood drawing area for the tests to be done  You will be contacted by phone if any changes need to be made immediately.  Otherwise, you will receive a letter about your results with an explanation, but please check with MyChart first.  Please remember to sign up for MyChart if you have not done so, as this will be important to you in the future with finding out test results, communicating by private email, and scheduling acute appointments online when needed.  Please return in 1 year for your yearly visit, or sooner if needed 

## 2019-03-07 NOTE — Assessment & Plan Note (Signed)

## 2019-03-08 LAB — URINALYSIS, ROUTINE W REFLEX MICROSCOPIC
Bilirubin Urine: NEGATIVE
Hgb urine dipstick: NEGATIVE
Ketones, ur: NEGATIVE
Leukocytes,Ua: NEGATIVE
Nitrite: NEGATIVE
RBC / HPF: NONE SEEN (ref 0–?)
Specific Gravity, Urine: 1.02 (ref 1.000–1.030)
Total Protein, Urine: NEGATIVE
Urine Glucose: NEGATIVE
Urobilinogen, UA: 0.2 (ref 0.0–1.0)
pH: 6.5 (ref 5.0–8.0)

## 2019-03-30 ENCOUNTER — Telehealth: Payer: Self-pay | Admitting: Internal Medicine

## 2019-03-30 NOTE — Telephone Encounter (Signed)
       1. Which medications need to be refilled? (please list name of each medication and dose if known) amphetamine-dextroamphetamine (ADDERALL) 30 MG tablet 2. Which pharmacy/location (including street and city if local pharmacy) is medication to be sent to?[id number] [place] - [address]  3. Do they need a 30 day or 90 day supply? 30

## 2019-04-02 MED ORDER — AMPHETAMINE-DEXTROAMPHETAMINE 30 MG PO TABS: 30.0000 mg | ORAL_TABLET | Freq: Two times a day (BID) | ORAL | 0 refills | Status: DC

## 2019-04-02 NOTE — Telephone Encounter (Signed)
Check [place] registry last filled 02/27/2019../lmb  

## 2019-04-02 NOTE — Telephone Encounter (Signed)
Done erx 

## 2019-04-27 ENCOUNTER — Telehealth: Payer: Self-pay

## 2019-04-27 MED ORDER — AMPHETAMINE-DEXTROAMPHETAMINE 30 MG PO TABS: 30.0000 mg | ORAL_TABLET | Freq: Two times a day (BID) | ORAL | 0 refills | Status: DC

## 2019-04-27 NOTE — Telephone Encounter (Signed)
Done erx 

## 2019-04-27 NOTE — Telephone Encounter (Signed)
1.Medication Requested:amphetamine-dextroamphetamine (ADDERALL) 30 MG tablet  2. Pharmacy (Name, Street, [place] [place] [address] [place]   3. On Med List: Yes   4. Last Visit with PCP: 1.13.2021   5. Next visit date with PCP: 1.17.2022    Agent: Please be advised that RX refills may take up to 3 business days. We ask that you follow-up with your pharmacy.

## 2019-05-03 ENCOUNTER — Encounter: Payer: Self-pay | Admitting: Internal Medicine

## 2019-05-28 ENCOUNTER — Telehealth: Payer: Self-pay

## 2019-05-28 NOTE — Telephone Encounter (Signed)
Please see message below

## 2019-05-28 NOTE — Telephone Encounter (Signed)
1.Medication Requested:amphetamine-dextroamphetamine (ADDERALL) 30 MG tablet  2. Pharmacy (Name[facility] Street, City[phone] [place], [place] - [address]  3. On Med List: Yes   4. Last Visit with PCP: 1.13.2021  5. Next visit date with PCP: 1.17.2022    Agent: Please be advised that RX refills may take up to 3 business days. We ask that you follow-up with your pharmacy.

## 2019-05-29 MED ORDER — AMPHETAMINE-DEXTROAMPHETAMINE 30 MG PO TABS: 30.0000 mg | ORAL_TABLET | Freq: Two times a day (BID) | ORAL | 0 refills | Status: DC

## 2019-05-29 NOTE — Telephone Encounter (Signed)
Done erx 

## 2019-06-28 ENCOUNTER — Telehealth: Payer: Self-pay | Admitting: Internal Medicine

## 2019-06-28 MED ORDER — AMPHETAMINE-DEXTROAMPHETAMINE 30 MG PO TABS: 30.0000 mg | ORAL_TABLET | Freq: Two times a day (BID) | ORAL | 0 refills | Status: DC

## 2019-06-28 NOTE — Telephone Encounter (Signed)
Done erx 

## 2019-06-28 NOTE — Telephone Encounter (Signed)
   1.Medication Requested:amphetamine-dextroamphetamine (ADDERALL) 30 MG tablet  2. Pharmacy ([facility] Street[phone] [facility] [place], [place] - [address]  3. On Med List: yes  4. Last Visit with PCP: 03/07/19  5. Next visit date with PCP: 03/10/20   Agent: Please be advised that RX refills may take up to 3 business days. We ask that you follow-up with your pharmacy.

## 2019-08-02 ENCOUNTER — Telehealth: Payer: Self-pay | Admitting: Internal Medicine

## 2019-08-02 NOTE — Telephone Encounter (Signed)
   1.[facility]ed:[id number] [place], [place] - [address]  2. Pharmacy ([facility] Street[phone] [place], [place] - [address]  3. On Med List: yes  4. Last Visit with PCP: 03/07/2019  5. Next visit date with PCP: Patient declined appointment , states "he always gets his medication without an appointment"   Agent: Please be advised that RX refills may take up to 3 business days. We ask that you follow-up with your pharmacy.

## 2019-08-03 NOTE — Telephone Encounter (Signed)
New message:    Pt is calling back and states he was needing amphetamine-dextroamphetamine (ADDERALL) 30 MG tablet

## 2019-08-03 NOTE — Telephone Encounter (Signed)
LVM for pt to call back the clinic with the name of the medication that is needed.

## 2019-08-06 MED ORDER — AMPHETAMINE-DEXTROAMPHETAMINE 30 MG PO TABS: 30.0000 mg | ORAL_TABLET | Freq: Two times a day (BID) | ORAL | 0 refills | Status: DC

## 2019-08-06 NOTE — Addendum Note (Signed)
Addended by: Corwin Levins on: 08/06/2019 09:41 PM   Modules accepted: Orders

## 2019-08-06 NOTE — Telephone Encounter (Signed)
Done erx 

## 2019-08-30 ENCOUNTER — Telehealth: Payer: Self-pay | Admitting: Internal Medicine

## 2019-08-30 NOTE — Telephone Encounter (Signed)
New message:   1.Medication Requested: amphetamine-dextroamphetamine (ADDERALL) 30 MG tablet 2. Pharmacy (Name, Street, [phone] [place], [place] - [address] 3. On Med List: yes  4. Last Visit with PCP: 03/07/19  5. Next visit date with PCP: 03/10/20   Agent: Please be advised that RX refills may take up to 3 business days. We ask that you follow-up with your pharmacy.

## 2019-08-31 MED ORDER — AMPHETAMINE-DEXTROAMPHETAMINE 30 MG PO TABS: 30.0000 mg | ORAL_TABLET | Freq: Two times a day (BID) | ORAL | 0 refills | Status: DC

## 2019-08-31 NOTE — Telephone Encounter (Signed)
Done erx 

## 2019-09-27 ENCOUNTER — Other Ambulatory Visit: Payer: Self-pay

## 2019-09-27 ENCOUNTER — Ambulatory Visit: Payer: Self-pay | Admitting: Internal Medicine

## 2019-09-27 ENCOUNTER — Encounter: Payer: Self-pay | Admitting: Internal Medicine

## 2019-09-27 VITALS — BP 110/76 | HR 77 | Temp 98.1°F | Ht 72.0 in | Wt 252.0 lb

## 2019-09-27 DIAGNOSIS — F909 Attention-deficit hyperactivity disorder, unspecified type: Secondary | ICD-10-CM

## 2019-09-27 DIAGNOSIS — N529 Male erectile dysfunction, unspecified: Secondary | ICD-10-CM

## 2019-09-27 DIAGNOSIS — R5383 Other fatigue: Secondary | ICD-10-CM

## 2019-09-27 DIAGNOSIS — J069 Acute upper respiratory infection, unspecified: Secondary | ICD-10-CM

## 2019-09-27 LAB — SARS-COV-2 IGG: SARS-COV-2 IgG: 0.51

## 2019-09-27 MED ORDER — AMPHETAMINE-DEXTROAMPHETAMINE 30 MG PO TABS: 30.0000 mg | ORAL_TABLET | Freq: Two times a day (BID) | ORAL | 0 refills | Status: DC

## 2019-09-27 NOTE — Assessment & Plan Note (Signed)
stable overall by history and exam, recent data reviewed with pt, and pt to continue medical treatment as before,  to f/u any worsening symptoms or concerns  

## 2019-09-27 NOTE — Assessment & Plan Note (Signed)
Declines viagra prn

## 2019-09-27 NOTE — Patient Instructions (Signed)

## 2019-09-27 NOTE — Assessment & Plan Note (Addendum)
Also likely related to deconditioning, for increased regular exercise, and check testosterone  I spent 31 minutes in preparing to see the patient by review of recent labs, imaging and procedures, obtaining and reviewing separately obtained history, communicating with the patient and family or caregiver, ordering medications, tests or procedures, and documenting clinical information in the EHR including the differential Dx, treatment, and any further evaluation and other management of fatigue, add, ed, uri

## 2019-09-27 NOTE — Progress Notes (Signed)
Subjective:    Patient ID: Isaiah Gibson, male    DOB: 18-Jun-1985, 33 y.o.   MRN: [id number]  HPI here with c/o increased obesity, less muscle mass, Low energy and worsening ED symptoms in the last few months, has been reading online and asks for testosterone check.  ALso had a URI about 6 mo ago,  Asks for covid ab testing, has not been vaccinated.  Pt denies chest pain, increased sob or doe, wheezing, orthopnea, PND, increased LE swelling, palpitations, dizziness or syncope.  Pt denies new neurological symptoms such as new headache, or facial or extremity weakness or numbness   Pt denies polydipsia, polyuria, Past Medical History:  Diagnosis Date  . ADD 10/20/2006  . BACK PAIN 05/15/2007  . GERD 10/20/2006  . HEMATOCHEZIA 12/18/2009  . HYPERTENSION 01/25/2008  . INSOMNIA-SLEEP DISORDER-UNSPEC 05/15/2007  . Palpitations 01/25/2008  . POLYCYTHEMIA 05/20/2009  . PSORIASIS 10/20/2006  . SMOKER 05/15/2007  . VENEREAL WART 05/15/2007   History reviewed. No pertinent surgical history.  reports that he quit smoking about 5 years ago. His smoking use included cigarettes. He has never used smokeless tobacco. He reports current alcohol use. He reports that he does not use drugs. family history includes Hypertension in an other family member. No Known Allergies Current Outpatient Medications on File Prior to Visit  Medication Sig Dispense Refill  . Aspirin-Acetaminophen-Caffeine (GOODY HEADACHE PO) Take 1 packet by mouth daily as needed (pain).    . cyclobenzaprine (FLEXERIL) 5 MG tablet Take 1 tablet (5 mg total) by mouth 3 (three) times daily as needed for muscle spasms. 90 tablet 1  . ibuprofen (ADVIL,MOTRIN) 200 MG tablet Take 400 mg by mouth every 6 (six) hours as needed for moderate pain.    . Risankizumab-rzaa,150 MG Dose, (SKYRIZI, 150 MG DOSE,) 75 MG/0.83ML PSKT Inject 1 each into the skin every 3 (three) months.     No current facility-administered medications on file prior to visit.   Review  of Systems All otherwise neg per pt    Objective:   Physical Exam BP 110/76 (BP Location: Left Arm, Patient Position: Sitting, Cuff Size: Large)   Pulse 77   Temp 98.1 F (36.7 C) (Oral)   Ht 6' (1.829 m)   Wt 252 lb (114.3 kg)   SpO2 97%   BMI 34.18 kg/m \\VS  noted,  Constitutional: Pt appears in NAD HENT: Head: NCAT.  Right Ear: External ear normal.  Left Ear: External ear normal.  Eyes: . Pupils are equal, round, and reactive to light. Conjunctivae and EOM are normal Nose: without d/c or deformity Neck: Neck supple. Gross normal ROM Cardiovascular: Normal rate and regular rhythm.   Pulmonary/Chest: Effort normal and breath sounds without rales or wheezing.  Abd:  Soft, NT, ND, + BS, no organomegaly Neurological: Pt is alert. At baseline orientation, motor grossly intact Skin: Skin is warm. No rashes, other new lesions, no LE edema Psychiatric: Pt behavior is normal without agitation  All otherwise neg per pt Lab Results  Component Value Date   WBC 12.6 (H) 03/07/2019   HGB 17.8 (H) 03/07/2019   HCT 52.0 03/07/2019   PLT 418.0 (H) 03/07/2019   GLUCOSE 95 03/07/2019   CHOL 156 03/07/2019   TRIG 138.0 03/07/2019   HDL 42.90 03/07/2019   LDLCALC 86 03/07/2019   ALT 26 03/07/2019   AST 17 03/07/2019   NA 137 03/07/2019   K 3.8 03/07/2019   CL 102 03/07/2019   CREATININE 0.86 03/07/2019   BUN  10 03/07/2019   CO2 25 03/07/2019   TSH 1.31 03/07/2019      Assessment & Plan:

## 2019-09-27 NOTE — Assessment & Plan Note (Signed)
Ok for covid ab testing 

## 2019-09-28 ENCOUNTER — Encounter: Payer: Self-pay | Admitting: Internal Medicine

## 2019-10-01 ENCOUNTER — Encounter: Payer: Self-pay | Admitting: Internal Medicine

## 2019-10-01 LAB — TESTOSTERONE, FREE, TOTAL, SHBG
Sex Hormone Binding: 30.1 nmol/L (ref 16.5–55.9)
Testosterone, Free: 9.3 pg/mL (ref 8.7–25.1)
Testosterone: 382 ng/dL (ref 264–916)

## 2019-10-31 ENCOUNTER — Telehealth: Payer: Self-pay | Admitting: Internal Medicine

## 2019-10-31 NOTE — Telephone Encounter (Signed)
   1.Medication Requested:amphetamine-dextroamphetamine (ADDERALL) 30 MG tablet  2. Pharmacy (Name, Street, City):[phone] [place], [place] - [address]  3. On Med List: yes  4. Last Visit with PCP: 09/27/19  5. Next visit date with PCP:03/10/20   Agent: Please be advised that RX refills may take up to 3 business days. We ask that you follow-up with your pharmacy.

## 2019-11-01 MED ORDER — AMPHETAMINE-DEXTROAMPHETAMINE 30 MG PO TABS: 30.0000 mg | ORAL_TABLET | Freq: Two times a day (BID) | ORAL | 0 refills | Status: DC

## 2019-11-01 NOTE — Telephone Encounter (Signed)
Sent to Dr. John. 

## 2019-11-01 NOTE — Telephone Encounter (Signed)
Done erx 

## 2019-12-03 ENCOUNTER — Telehealth: Payer: Self-pay | Admitting: Internal Medicine

## 2019-12-03 NOTE — Telephone Encounter (Signed)
amphetamine-dextroamphetamine (ADDERALL) 30 MG tablet  [id number] [place], [place] - [address] AT [place] [address] Phone:  [phone]  Fax:  [phone]     Last appt: 8.5.21 Next appt: 1.17.22

## 2019-12-05 MED ORDER — AMPHETAMINE-DEXTROAMPHETAMINE 30 MG PO TABS: 30.0000 mg | ORAL_TABLET | Freq: Two times a day (BID) | ORAL | 0 refills | Status: DC

## 2019-12-05 NOTE — Telephone Encounter (Signed)
Done erx 

## 2019-12-05 NOTE — Telephone Encounter (Signed)
Sent to Dr. John. 

## 2020-01-02 ENCOUNTER — Telehealth: Payer: Self-pay | Admitting: Internal Medicine

## 2020-01-02 MED ORDER — AMPHETAMINE-DEXTROAMPHETAMINE 30 MG PO TABS: 30.0000 mg | ORAL_TABLET | Freq: Two times a day (BID) | ORAL | 0 refills | Status: DC

## 2020-01-02 NOTE — Telephone Encounter (Signed)
Sent in

## 2020-01-02 NOTE — Telephone Encounter (Signed)
Sent to Dr Crawford 

## 2020-01-02 NOTE — Telephone Encounter (Signed)
amphetamine-dextroamphetamine (ADDERALL) 30 MG tablet [id number] [place], [place] - [address] AT [place] [address] Phone:  [phone]  Fax:  [phone]     Requesting a refill

## 2020-01-25 ENCOUNTER — Other Ambulatory Visit: Payer: Self-pay

## 2020-01-25 ENCOUNTER — Emergency Department (INDEPENDENT_AMBULATORY_CARE_PROVIDER_SITE_OTHER): Payer: Self-pay

## 2020-01-25 ENCOUNTER — Emergency Department
Admission: EM | Admit: 2020-01-25 | Discharge: 2020-01-25 | Disposition: A | Discharge status: Home / Self Care | Payer: Self-pay | Source: Home / Self Care

## 2020-01-25 DIAGNOSIS — M79662 Pain in left lower leg: Secondary | ICD-10-CM

## 2020-01-25 DIAGNOSIS — S86812A Strain of other muscle(s) and tendon(s) at lower leg level, left leg, initial encounter: Secondary | ICD-10-CM

## 2020-01-25 DIAGNOSIS — R2242 Localized swelling, mass and lump, left lower limb: Secondary | ICD-10-CM

## 2020-01-25 DIAGNOSIS — I8002 Phlebitis and thrombophlebitis of superficial vessels of left lower extremity: Secondary | ICD-10-CM

## 2020-01-25 NOTE — ED Provider Notes (Signed)
[facility]    CSN: [id number] Arrival date & time: 01/25/20  1119      History   Chief Complaint Chief Complaint  Patient presents with  . Calf pain    LT    HPI Isaiah Gibson is a 34 y.o. male.   HPI Isaiah Gibson is a 34 y.o. male presenting to UC with c/o feeling a "pop" in his Left calf this morning while playing tennis.  Pt describes pain as a cramp and dull ache, hurts to flex or bear weight on it.  He has been using crutches.  He took ibuprofen 800mg  and applied ice at 10:30AM, pain is 3/10 at this time.    Past Medical History:  Diagnosis Date  . ADD 10/20/2006  . BACK PAIN 05/15/2007  . GERD 10/20/2006  . HEMATOCHEZIA 12/18/2009  . HYPERTENSION 01/25/2008  . INSOMNIA-SLEEP DISORDER-UNSPEC 05/15/2007  . Palpitations 01/25/2008  . POLYCYTHEMIA 05/20/2009  . PSORIASIS 10/20/2006  . SMOKER 05/15/2007  . VENEREAL WART 05/15/2007    Patient Active Problem List   Diagnosis Date Noted  . Fatigue 09/27/2019  . Erectile dysfunction 09/27/2019  . Upper respiratory tract infection 09/27/2019  . Screening-pulmonary TB 04/23/2017  . Anxiety 04/14/2016  . Panic attacks 04/14/2016  . Weight gain 11/26/2015  . Former smoker 11/26/2015  . Pectoralis muscle strain 04/22/2015  . Tendinitis of forearm 04/22/2015  . 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 04/16/2012  . Dizziness 01/25/2011  . Urinary frequency 01/25/2011  . Bilateral leg edema 01/25/2011  . Preventative health care 01/02/2011  . HEMATOCHEZIA 12/18/2009  . WRIST PAIN, RIGHT 12/09/2009  . ANKLE PAIN, RIGHT 12/09/2009  . POLYCYTHEMIA 05/20/2009  . RASH-NONVESICULAR 09/04/2008  . ANKLE SPRAIN, LEFT 05/31/2008  . Palpitations 01/25/2008  . VENEREAL WART 05/15/2007  . SMOKER 05/15/2007  . BACK PAIN 05/15/2007  . INSOMNIA-SLEEP DISORDER-UNSPEC 05/15/2007  . Attention deficit disorder 10/20/2006  . GERD 10/20/2006  . PSORIASIS 10/20/2006    History reviewed. No pertinent surgical  history.     Home Medications    Prior to Admission medications   Medication Sig Start Date End Date Taking? Authorizing Provider  Chorionic Gonadotropin (HCG) 6000 units SOLR Inject as directed.   Yes [provider]  amphetamine-dextroamphetamine (ADDERALL) 30 MG tablet Take 1 tablet by mouth 2 (two) times daily. 01/02/20   13/10/21, MD  Aspirin-Acetaminophen-Caffeine (GOODY HEADACHE PO) Take 1 packet by mouth daily as needed (pain).    [provider]  cyclobenzaprine (FLEXERIL) 5 MG tablet Take 1 tablet (5 mg total) by mouth 3 (three) times daily as needed for muscle spasms. 10/31/12   12/31/12, MD  ibuprofen (ADVIL,MOTRIN) 200 MG tablet Take 400 mg by mouth every 6 (six) hours as needed for moderate pain.    [provider]  Risankizumab-rzaa,150 MG Dose, (SKYRIZI, 150 MG DOSE,) 75 MG/0.83ML PSKT Inject 1 each into the skin every 3 (three) months.    [provider]    Family History Family History  Problem Relation Age of Onset  . Hypertension Other        Grandfather    Social History Social History   Tobacco Use  . Smoking status: Former Smoker    Types: Cigarettes    Quit date: 09/19/2014    Years since quitting: 5.3  . Smokeless tobacco: Never Used  Vaping Use  . Vaping Use: Never used  Substance Use Topics  . Alcohol use: Yes    Alcohol/week:  0.0 standard drinks  . Drug use: No     Allergies   Patient has no known allergies.   Review of Systems Review of Systems  Musculoskeletal: Positive for gait problem and myalgias. Negative for joint swelling.  Skin: Negative for color change and wound.     Physical Exam Triage Vital Signs ED Triage Vitals  Enc Vitals Group     BP 01/25/20 1258 (!) 160/90     Pulse Rate 01/25/20 1258 84     Resp 01/25/20 1258 17     Temp 01/25/20 1258 98 F (36.7 C)     Temp Source 01/25/20 1258 Oral     SpO2 01/25/20 1258 97 %     Weight --      Height --      Head  Circumference --      Peak Flow --      Pain Score 01/25/20 1301 3     Pain Loc --      Pain Edu? --      Excl. in GC? --    No data found.  Updated Vital Signs BP (!) 160/90 (BP Location: Right Arm)   Pulse 84   Temp 98 F (36.7 C) (Oral)   Resp 17   SpO2 97%   Visual Acuity Right Eye Distance:   Left Eye Distance:   Bilateral Distance:    Right Eye Near:   Left Eye Near:    Bilateral Near:     Physical Exam Vitals and nursing note reviewed.  Constitutional:      Appearance: Normal appearance. He is well-developed.  HENT:     Head: Normocephalic and atraumatic.  Cardiovascular:     Rate and Rhythm: Normal rate and regular rhythm.     Pulses:          Dorsalis pedis pulses are 2+ on the left side.       Posterior tibial pulses are 2+ on the left side.  Pulmonary:     Effort: Pulmonary effort is normal.  Musculoskeletal:        General: Tenderness present. Normal range of motion.     Cervical back: Normal range of motion.     Comments: Left leg: full ROM knee, ankle and toes, tenderness to calf.   Skin:    General: Skin is warm and dry.     Capillary Refill: Capillary refill takes less than 2 seconds.  Neurological:     Mental Status: He is alert and oriented to person, place, and time.  Psychiatric:        Behavior: Behavior normal.      UC Treatments / Results  Labs (all labs ordered are listed, but only abnormal results are displayed) Labs Reviewed - No data to display  EKG   Radiology Narrative & Impression  CLINICAL DATA:  34 year old male with a history of left calf pain and swelling  EXAM: LEFT LOWER EXTREMITY VENOUS DOPPLER ULTRASOUND  TECHNIQUE: Gray-scale sonography with graded compression, as well as color Doppler and duplex ultrasound were performed to evaluate the lower extremity deep venous systems from the level of the common femoral vein and including the common femoral, femoral, profunda femoral, popliteal and calf veins  including the posterior tibial, peroneal and gastrocnemius veins when visible. The superficial great saphenous vein was also interrogated. Spectral Doppler was utilized to evaluate flow at rest and with distal augmentation maneuvers in the common femoral, femoral and popliteal veins.  COMPARISON:  None.  FINDINGS: Contralateral Common  Femoral Vein: Respiratory phasicity is normal and symmetric with the symptomatic side. No evidence of thrombus. Normal compressibility.  Common Femoral Vein: No evidence of thrombus. Normal compressibility, respiratory phasicity and response to augmentation.  Saphenofemoral Junction: No evidence of thrombus. Normal compressibility and flow on color Doppler imaging.  Profunda Femoral Vein: No evidence of thrombus. Normal compressibility and flow on color Doppler imaging.  Femoral Vein: No evidence of thrombus. Normal compressibility, respiratory phasicity and response to augmentation.  Popliteal Vein: No evidence of thrombus. Normal compressibility, respiratory phasicity and response to augmentation.  Calf Veins: Posterior tibial vein patent and compressible. Peroneal vein not visualized.  Superficial Great Saphenous Vein: No evidence of thrombus. Normal compressibility and flow on color Doppler imaging.  Other Findings:  Small saphenous vein in the calf noncompressible.  IMPRESSION: Sonographic survey of the left lower extremity negative for DVT.  Positive for superficial thrombophlebitis of the small saphenous vein in the posterior calf.   Electronically Signed   By: Gilmer Mor D.O.   On: 01/25/2020 14:27       Procedures Procedures (including critical care time)  Medications Ordered in UC Medications - No data to display  Initial Impression / Assessment and Plan / UC Course  I have reviewed the triage vital signs and the nursing notes.  Pertinent labs & imaging results that were available during my care of  the patient were reviewed by me and considered in my medical decision making (see chart for details).     Discussed imaging with pt Discussed home tx Encouraged f/u with Sports Medicine or orthopedist next week  AVS given  Final Clinical Impressions(s) / UC Diagnoses   Final diagnoses:  Strain of calf muscle, left, initial encounter  Thrombophlebitis of superficial veins of left lower extremity     Discharge Instructions       Call later today or Monday to schedule a follow up appointment with Sports Medicine or a previously established orthopedist for further evaluation and treatment of symptoms.   You may take 500mg  acetaminophen every 4-6 hours or in combination with ibuprofen 400-600mg  every 6-8 hours as needed for pain and inflammation. You should also try applying warm compresses such as a warm damp wash cloth to your calf 2-3 times daily for 15 minutes at a time.   You can try wearing a compression sleeve or sock to help with discomfort in your calf as it continues to heal.     ED Prescriptions    None     PDMP not reviewed this encounter.   , Lurene Shadow 01/28/20 [phone]

## 2020-01-25 NOTE — ED Triage Notes (Signed)
Pt c/o LT calf pain since this morning. Says he was playing tennis when he felt a "pop". Says feels like he has a cramping feeling in his calf. Dull ache, hurts to flex or bear weight on it. Pain 3/10 Ibuprofen 800mg  at 1030 and ice applied.

## 2020-01-25 NOTE — Discharge Instructions (Signed)
° °  Call later today or Monday to schedule a follow up appointment with Sports Medicine or a previously established orthopedist for further evaluation and treatment of symptoms.   You may take 500mg  acetaminophen every 4-6 hours or in combination with ibuprofen 400-600mg  every 6-8 hours as needed for pain and inflammation. You should also try applying warm compresses such as a warm damp wash cloth to your calf 2-3 times daily for 15 minutes at a time.   You can try wearing a compression sleeve or sock to help with discomfort in your calf as it continues to heal.

## 2020-01-28 ENCOUNTER — Telehealth: Payer: Self-pay | Admitting: Internal Medicine

## 2020-01-28 MED ORDER — AMPHETAMINE-DEXTROAMPHETAMINE 30 MG PO TABS: 30.0000 mg | ORAL_TABLET | Freq: Two times a day (BID) | ORAL | 0 refills | Status: DC

## 2020-01-28 NOTE — Telephone Encounter (Signed)
Patient is requesting a med refill for amphetamine-dextroamphetamine (ADDERALL) 30 MG tablet  [id number] [place], [place] - [address]

## 2020-01-28 NOTE — Telephone Encounter (Signed)
Sent to Dr. John. 

## 2020-03-03 ENCOUNTER — Telehealth: Payer: Self-pay | Admitting: Internal Medicine

## 2020-03-03 MED ORDER — AMPHETAMINE-DEXTROAMPHETAMINE 30 MG PO TABS: 30.0000 mg | ORAL_TABLET | Freq: Two times a day (BID) | ORAL | 0 refills | Status: DC

## 2020-03-03 NOTE — Telephone Encounter (Signed)
1.Medication Requested: amphetamine-dextroamphetamine (ADDERALL) 30 MG tablet 2. Pharmacy (Name, Street, [place]): [phone] [place], [place] - [address] AT [address] Phone:  [phone]  Fax:  [phone]     3. On Med List: yes 4. Last Visit with PCP: 08.05.21 5. Next visit date with PCP: 01.17.21  Agent: Please be advised that RX refills may take up to 3 business days. We ask that you follow-up with your pharmacy.

## 2020-03-10 ENCOUNTER — Encounter: Payer: Self-pay | Admitting: Internal Medicine

## 2020-03-31 ENCOUNTER — Encounter: Payer: Self-pay | Admitting: Internal Medicine

## 2020-03-31 DIAGNOSIS — Z0289 Encounter for other administrative examinations: Secondary | ICD-10-CM

## 2020-04-02 ENCOUNTER — Telehealth: Payer: Self-pay | Admitting: Internal Medicine

## 2020-04-02 MED ORDER — AMPHETAMINE-DEXTROAMPHETAMINE 30 MG PO TABS: 30.0000 mg | ORAL_TABLET | Freq: Two times a day (BID) | ORAL | 0 refills | Status: DC

## 2020-04-02 NOTE — Telephone Encounter (Signed)
1.Medication Requested: amphetamine-dextroamphetamine (ADDERALL) 30 MG tablet 2. Pharmacy (Name, Street, [place]): [phone] [place], [place] - [address] AT [address] Phone:  [phone]  Fax:  [phone]     3. On Med List: yes 4. Last Visit with PCP: 08.05.21 5. Next visit date with PCP: 02.22.22

## 2020-04-15 ENCOUNTER — Ambulatory Visit (INDEPENDENT_AMBULATORY_CARE_PROVIDER_SITE_OTHER): Payer: Self-pay | Admitting: Internal Medicine

## 2020-04-15 ENCOUNTER — Encounter: Payer: Self-pay | Admitting: Internal Medicine

## 2020-04-15 ENCOUNTER — Other Ambulatory Visit: Payer: Self-pay

## 2020-04-15 VITALS — BP 124/80 | HR 65 | Temp 98.4°F | Resp 18 | Ht 72.0 in | Wt 269.0 lb

## 2020-04-15 DIAGNOSIS — Z0000 Encounter for general adult medical examination without abnormal findings: Secondary | ICD-10-CM

## 2020-04-15 DIAGNOSIS — F909 Attention-deficit hyperactivity disorder, unspecified type: Secondary | ICD-10-CM

## 2020-04-15 NOTE — Progress Notes (Signed)
Patient ID: Isaiah Gibson, male   DOB: 1985-10-26, 35 y.o.   MRN: [id number]         Chief Complaint:: wellness exam and polycythemia       HPI:  Isaiah Gibson is a 35 y.o. male here for wellness exam; peak wt has been 297 in past, now 100; has been active with a local provider treating him with testosterone and hcg but provider has moved away, and pt asks for hcg from me.  Had recent labs with Hgb 18.8   .  S/p covid infection about 1 yr ago, no complicatoins.  Pt denies chest pain, increased sob or doe, wheezing, orthopnea, PND, increased LE swelling, palpitations, dizziness or syncope.   Pt denies polydipsia, polyuria, Denies new neuro s/s.    Pt denies fever, night sweats, loss of appetite, or other constitutional symptoms  Declines labs today Wt Readings from Last 3 Encounters:  04/15/20 269 lb (122 kg)  09/27/19 252 lb (114.3 kg)  03/07/19 269 lb (122 kg)   BP Readings from Last 3 Encounters:  04/15/20 124/80  01/25/20 (!) 160/90  09/27/19 110/76   Immunization History  Administered Date(s) Administered  . PPD Test 04/20/2017  . Td 12/09/2009   Health Maintenance Due  Topic Date Due  . Hepatitis C Screening  Never done  . COVID-19 Vaccine (1) Never done  . HIV Screening  Never done  . INFLUENZA VACCINE  Never done  . TETANUS/TDAP  12/10/2019      Past Medical History:  Diagnosis Date  . ADD 10/20/2006  . BACK PAIN 05/15/2007  . GERD 10/20/2006  . HEMATOCHEZIA 12/18/2009  . HYPERTENSION 01/25/2008  . INSOMNIA-SLEEP DISORDER-UNSPEC 05/15/2007  . Palpitations 01/25/2008  . POLYCYTHEMIA 05/20/2009  . PSORIASIS 10/20/2006  . SMOKER 05/15/2007  . VENEREAL WART 05/15/2007   History reviewed. No pertinent surgical history.  reports that he quit smoking about 5 years ago. His smoking use included cigarettes. He has never used smokeless tobacco. He reports current alcohol use. He reports that he does not use drugs. family history includes Hypertension in an other family member. No  Known Allergies Current Outpatient Medications on File Prior to Visit  Medication Sig Dispense Refill  . amphetamine-dextroamphetamine (ADDERALL) 30 MG tablet Take 1 tablet by mouth 2 (two) times daily. 60 tablet 0  . Aspirin-Acetaminophen-Caffeine (GOODY HEADACHE PO) Take 1 packet by mouth daily as needed (pain).    . Chorionic Gonadotropin (HCG) 6000 units SOLR Inject as directed.    . cyclobenzaprine (FLEXERIL) 5 MG tablet Take 1 tablet (5 mg total) by mouth 3 (three) times daily as needed for muscle spasms. 90 tablet 1  . ibuprofen (ADVIL,MOTRIN) 200 MG tablet Take 400 mg by mouth every 6 (six) hours as needed for moderate pain.    . Risankizumab-rzaa,150 MG Dose, (SKYRIZI, 150 MG DOSE,) 75 MG/0.83ML PSKT Inject 1 each into the skin every 3 (three) months.     No current facility-administered medications on file prior to visit.        ROS:  All others reviewed and negative.  Objective        PE:  BP 124/80   Pulse 65   Temp 98.4 F (36.9 C) (Oral)   Resp 18   Ht 6' (1.829 m)   Wt 269 lb (122 kg)   SpO2 96%   BMI 36.48 kg/m                 Constitutional: Pt appears in NAD  HENT: Head: NCAT.                Right Ear: External ear normal.                 Left Ear: External ear normal.                Eyes: . Pupils are equal, round, and reactive to light. Conjunctivae and EOM are normal               Nose: without d/c or deformity               Neck: Neck supple. Gross normal ROM               Cardiovascular: Normal rate and regular rhythm.                 Pulmonary/Chest: Effort normal and breath sounds without rales or wheezing.                Abd:  Soft, NT, ND, + BS, no organomegaly               Neurological: Pt is alert. At baseline orientation, motor grossly intact               Skin: Skin is warm. No rashes, no other new lesions, LE edema -none               Psychiatric: Pt behavior is normal without agitation   Micro: none  Cardiac tracings I have  personally interpreted today:  none  Pertinent Radiological findings (summarize): none   Lab Results  Component Value Date   WBC 12.6 (H) 03/07/2019   HGB 17.8 (H) 03/07/2019   HCT 52.0 03/07/2019   PLT 418.0 (H) 03/07/2019   GLUCOSE 95 03/07/2019   CHOL 156 03/07/2019   TRIG 138.0 03/07/2019   HDL 42.90 03/07/2019   LDLCALC 86 03/07/2019   ALT 26 03/07/2019   AST 17 03/07/2019   NA 137 03/07/2019   K 3.8 03/07/2019   CL 102 03/07/2019   CREATININE 0.86 03/07/2019   BUN 10 03/07/2019   CO2 25 03/07/2019   TSH 1.31 03/07/2019   Assessment/Plan:  Isaiah Gibson is a 35 y.o. [place] or Caucasian [1] male with  has a past medical history of ADD (10/20/2006), BACK PAIN (05/15/2007), GERD (10/20/2006), HEMATOCHEZIA (12/18/2009), HYPERTENSION (01/25/2008), INSOMNIA-SLEEP DISORDER-UNSPEC (05/15/2007), Palpitations (01/25/2008), POLYCYTHEMIA (05/20/2009), PSORIASIS (10/20/2006), SMOKER (05/15/2007), and VENEREAL WART (05/15/2007).  Preventative health care Age and sex appropriate education and counseling updated with regular exercise and diet Referrals for preventative services - none needed Immunizations addressed - none needed Smoking counseling  - none needed Evidence for depression or other mood disorder - none significant, but d/w pt about his possibly ill advised supplement use, and recommended he stop the testosterone and hcg treatments Most recent labs reviewed. I have personally reviewed and have noted: 1) the patient's medical and social history 2) The patient's current medications and supplements 3) The patient's height, weight, and BMI have been recorded in the chart   Attention deficit disorder Stable cont current tx  Followup: Return in about 1 year (around 04/15/2021).  Isaiah Barre, MD 04/21/2020 10:47 PM [facility]

## 2020-04-21 ENCOUNTER — Encounter: Payer: Self-pay | Admitting: Internal Medicine

## 2020-04-21 NOTE — Patient Instructions (Signed)
Please continue all other medications as before, and refills have been done if requested.  Please have the pharmacy call with any other refills you may need.  Please continue your efforts at being more active, low cholesterol diet, and weight control.  You are otherwise up to date with prevention measures today.  Please keep your appointments with your specialists as you may have planned  Please make an Appointment to return for your 1 year visit, or sooner if needed 

## 2020-04-21 NOTE — Assessment & Plan Note (Signed)
Stable cont current tx

## 2020-04-21 NOTE — Assessment & Plan Note (Signed)
Age and sex appropriate education and counseling updated with regular exercise and diet Referrals for preventative services - none needed Immunizations addressed - none needed Smoking counseling  - none needed Evidence for depression or other mood disorder - none significant, but d/w pt about his possibly ill advised supplement use, and recommended he stop the testosterone and hcg treatments Most recent labs reviewed. I have personally reviewed and have noted: 1) the patient's medical and social history 2) The patient's current medications and supplements 3) The patient's height, weight, and BMI have been recorded in the chart

## 2020-05-01 ENCOUNTER — Telehealth: Payer: Self-pay | Admitting: Internal Medicine

## 2020-05-01 MED ORDER — AMPHETAMINE-DEXTROAMPHETAMINE 30 MG PO TABS: 30.0000 mg | ORAL_TABLET | Freq: Two times a day (BID) | ORAL | 0 refills | Status: DC

## 2020-05-01 NOTE — Telephone Encounter (Signed)
amphetamine-dextroamphetamine (ADDERALL) 30 MG tablet  Patient states he needs a refill on this medication

## 2020-06-04 ENCOUNTER — Telehealth: Payer: Self-pay | Admitting: Internal Medicine

## 2020-06-04 NOTE — Telephone Encounter (Signed)
1.Medication Requested: amphetamine-dextroamphetamine (ADDERALL) 30 MG tablet    2. Pharmacy (Name, Street, [place]): [phone] [place], [place] - [address]  3. On Med List: yes   4. Last Visit with PCP: 04-15-20  5. Next visit date with PCP: n/a    Agent: Please be advised that RX refills may take up to 3 business days. We ask that you follow-up with your pharmacy.

## 2020-06-05 MED ORDER — AMPHETAMINE-DEXTROAMPHETAMINE 30 MG PO TABS: 30.0000 mg | ORAL_TABLET | Freq: Two times a day (BID) | ORAL | 0 refills | Status: DC

## 2020-07-03 ENCOUNTER — Telehealth: Payer: Self-pay | Admitting: Internal Medicine

## 2020-07-03 NOTE — Telephone Encounter (Signed)
1.Medication Requested: amphetamine-dextroamphetamine (ADDERALL) 30 MG tablet    2. Pharmacy (Name, Street, [place]): [phone] [place] [place] - [address]  3. On Med List: yes   4. Last Visit with PCP: 04-15-20  5. Next visit date with PCP: n/a    Agent: Please be advised that RX refills may take up to 3 business days. We ask that you follow-up with your pharmacy.

## 2020-07-04 MED ORDER — AMPHETAMINE-DEXTROAMPHETAMINE 30 MG PO TABS: 30.0000 mg | ORAL_TABLET | Freq: Two times a day (BID) | ORAL | 0 refills | Status: DC

## 2020-07-04 NOTE — Telephone Encounter (Signed)
PMP done; patient last filled 06/06/20; please advise

## 2020-07-31 ENCOUNTER — Telehealth: Payer: Self-pay | Admitting: Internal Medicine

## 2020-07-31 NOTE — Telephone Encounter (Signed)
   Patient requesting refill for amphetamine-dextroamphetamine (ADDERALL) 30 MG tablet   [facility] [phone] [place], [place] - [address] (Ph: [phone])

## 2020-08-01 MED ORDER — AMPHETAMINE-DEXTROAMPHETAMINE 30 MG PO TABS: 30.0000 mg | ORAL_TABLET | Freq: Two times a day (BID) | ORAL | 0 refills | Status: DC

## 2020-08-01 NOTE — Telephone Encounter (Signed)
Last Visit: 04/15/20 Next Visit: none Last Filled: 07/06/20  PMP done; please advise

## 2020-09-08 ENCOUNTER — Other Ambulatory Visit: Payer: Self-pay | Admitting: Internal Medicine

## 2020-09-08 MED ORDER — AMPHETAMINE-DEXTROAMPHETAMINE 30 MG PO TABS: 30.0000 mg | ORAL_TABLET | Freq: Two times a day (BID) | ORAL | 0 refills | Status: DC

## 2020-09-11 ENCOUNTER — Other Ambulatory Visit: Payer: Self-pay

## 2020-09-11 ENCOUNTER — Encounter: Payer: Self-pay | Admitting: Internal Medicine

## 2020-09-11 ENCOUNTER — Ambulatory Visit (INDEPENDENT_AMBULATORY_CARE_PROVIDER_SITE_OTHER): Payer: Self-pay | Admitting: Internal Medicine

## 2020-09-11 DIAGNOSIS — F419 Anxiety disorder, unspecified: Secondary | ICD-10-CM

## 2020-09-11 DIAGNOSIS — F909 Attention-deficit hyperactivity disorder, unspecified type: Secondary | ICD-10-CM

## 2020-09-11 DIAGNOSIS — R361 Hematospermia: Secondary | ICD-10-CM

## 2020-09-11 LAB — URINALYSIS, ROUTINE W REFLEX MICROSCOPIC
Bilirubin Urine: NEGATIVE
Hgb urine dipstick: NEGATIVE
Ketones, ur: NEGATIVE
Leukocytes,Ua: NEGATIVE
Nitrite: NEGATIVE
Specific Gravity, Urine: 1.02 (ref 1.000–1.030)
Total Protein, Urine: NEGATIVE
Urine Glucose: NEGATIVE
Urobilinogen, UA: 0.2 (ref 0.0–1.0)
pH: 6 (ref 5.0–8.0)

## 2020-09-11 NOTE — Patient Instructions (Signed)
Please continue all other medications as before, and refills have been done if requested.  Please have the pharmacy call with any other refills you may need.  Please continue your efforts at being more active, low cholesterol diet, and weight control  Please keep your appointments with your specialists as you may have planned  Please go to the LAB at the blood drawing area for the tests to be done  - just the urine testing today  You will be contacted by phone if any changes need to be made immediately.  Otherwise, you will receive a letter about your results with an explanation, but please check with MyChart first.  Please remember to sign up for MyChart if you have not done so, as this will be important to you in the future with finding out test results, communicating by private email, and scheduling acute appointments online when needed.     

## 2020-09-11 NOTE — Progress Notes (Signed)
Patient ID: [name], male   DOB: [birth year], 35 y.o.   MRN: [id number]  \      Chief Complaint: follow up blood in semen, anxiety, adhd       HPI:  Isaiah Gibson is a 35 y.o. male here with c/o 2 episodes of BRB with ejaculatoin only 2 wks ago, none since then.  Denies urinary symptoms such as dysuria, frequency, urgency, flank pain, hematuria or n/v, fever, chills.  Pt denies chest pain, increased sob or doe, wheezing, orthopnea, PND, increased LE swelling, palpitations, dizziness or syncope.   Pt denies polydipsia, polyuria, or Denies worsening depressive symptoms, suicidal ideation, or panic; has ongoing anxiety, not increased recently.   ADHD meds working well as is able to continue to function at work and social.        Wt Readings from Last 3 Encounters:  09/11/20 264 lb 9.6 oz (120 kg)  04/15/20 269 lb (122 kg)  09/27/19 252 lb (114.3 kg)   BP Readings from Last 3 Encounters:  09/11/20 122/90  04/15/20 124/80  01/25/20 (!) 160/90         Past Medical History:  Diagnosis Date   ADD 10/20/2006   BACK PAIN 05/15/2007   GERD 10/20/2006   HEMATOCHEZIA 12/18/2009   HYPERTENSION 01/25/2008   INSOMNIA-SLEEP DISORDER-UNSPEC 05/15/2007   Palpitations 01/25/2008   POLYCYTHEMIA 05/20/2009   PSORIASIS 10/20/2006   SMOKER 05/15/2007   VENEREAL WART 05/15/2007   History reviewed. No pertinent surgical history.  reports that he quit smoking about 5 years ago. His smoking use included cigarettes. He has never used smokeless tobacco. He reports current alcohol use. He reports that he does not use drugs. family history includes Hypertension in an other family member. No Known Allergies Current Outpatient Medications on File Prior to Visit  Medication Sig Dispense Refill   amphetamine-dextroamphetamine (ADDERALL) 30 MG tablet Take 1 tablet by mouth 2 (two) times daily. 60 tablet 0   Aspirin-Acetaminophen-Caffeine (GOODY HEADACHE PO) Take 1 packet by mouth daily as needed (pain).     Chorionic  Gonadotropin (HCG) 6000 units SOLR Inject as directed.     cyclobenzaprine (FLEXERIL) 5 MG tablet Take 1 tablet (5 mg total) by mouth 3 (three) times daily as needed for muscle spasms. 90 tablet 1   ibuprofen (ADVIL,MOTRIN) 200 MG tablet Take 400 mg by mouth every 6 (six) hours as needed for moderate pain.     Risankizumab-rzaa,150 MG Dose, (SKYRIZI, 150 MG DOSE,) 75 MG/0.83ML PSKT Inject 1 each into the skin every 3 (three) months.     No current facility-administered medications on file prior to visit.        ROS:  All others reviewed and negative.  Objective        PE:  BP 122/90   Pulse 69   Temp 98.2 F (36.8 C) (Oral)   Resp 18   Ht 6' (1.829 m)   Wt 264 lb 9.6 oz (120 kg)   SpO2 99%   BMI 35.89 kg/m                 Constitutional: Pt appears in NAD               HENT: Head: NCAT.                Right Ear: External ear normal.                 Left Ear: External ear normal.  Eyes: . Pupils are equal, round, and reactive to light. Conjunctivae and EOM are normal               Nose: without d/c or deformity               Neck: Neck supple. Gross normal ROM               Cardiovascular: Normal rate and regular rhythm.                 Pulmonary/Chest: Effort normal and breath sounds without rales or wheezing.                Abd:  Soft, NT, ND, + BS, no organomegaly               Neurological: Pt is alert. At baseline orientation, motor grossly intact               Skin: Skin is warm. No rashes, no other new lesions, LE edema - none               Psychiatric: Pt behavior is normal without agitation , mild nervous  Micro: none  Cardiac tracings I have personally interpreted today:  none  Pertinent Radiological findings (summarize): none   Lab Results  Component Value Date   WBC 12.6 (H) 03/07/2019   HGB 17.8 (H) 03/07/2019   HCT 52.0 03/07/2019   PLT 418.0 (H) 03/07/2019   GLUCOSE 95 03/07/2019   CHOL 156 03/07/2019   TRIG 138.0 03/07/2019   HDL 42.90  03/07/2019   LDLCALC 86 03/07/2019   ALT 26 03/07/2019   AST 17 03/07/2019   NA 137 03/07/2019   K 3.8 03/07/2019   CL 102 03/07/2019   CREATININE 0.86 03/07/2019   BUN 10 03/07/2019   CO2 25 03/07/2019   TSH 1.31 03/07/2019   Assessment/Plan:  Isaiah Gibson is a 35 y.o. [place] or Caucasian [1] male with  has a past medical history of ADD (10/20/2006), BACK PAIN (05/15/2007), GERD (10/20/2006), HEMATOCHEZIA (12/18/2009), HYPERTENSION (01/25/2008), INSOMNIA-SLEEP DISORDER-UNSPEC (05/15/2007), Palpitations (01/25/2008), POLYCYTHEMIA (05/20/2009), PSORIASIS (10/20/2006), SMOKER (05/15/2007), and VENEREAL WART (05/15/2007).  Hematospermia D/w pt, reassured, for UA but o/w no specific evaluation or tx is needed  Attention deficit disorder Stable overall, to continue current med tx - adderall 30   Anxiety Overall mild situational today, declines other change in tx today  Followup: Return if symptoms worsen or fail to improve.  Oliver Barre, MD 09/13/2020 9:06 PM [facility]

## 2020-09-13 ENCOUNTER — Encounter: Payer: Self-pay | Admitting: Internal Medicine

## 2020-09-13 NOTE — Assessment & Plan Note (Signed)
Stable overall, to continue current med tx - adderall 30

## 2020-09-13 NOTE — Assessment & Plan Note (Signed)
Overall mild situational today, declines other change in tx today

## 2020-09-13 NOTE — Assessment & Plan Note (Addendum)
D/w pt, new onset uncontrolled, for UA but o/w no specific evaluation or tx is needed

## 2020-10-06 ENCOUNTER — Telehealth: Payer: Self-pay | Admitting: Internal Medicine

## 2020-10-06 NOTE — Telephone Encounter (Signed)
1.Medication Requested: amphetamine-dextroamphetamine (ADDERALL) 30 MG tablet   2. Pharmacy (Name, Street, [place]): [phone] [place], [place] - [address]  Phone:  [phone] Fax:  [phone]   3. On Med List: yes  4. Last Visit with PCP: 07.21.22  5. Next visit date with PCP: n/a   Agent: Please be advised that RX refills may take up to 3 business days. We ask that you follow-up with your pharmacy.

## 2020-10-07 MED ORDER — AMPHETAMINE-DEXTROAMPHETAMINE 30 MG PO TABS: 30.0000 mg | ORAL_TABLET | Freq: Two times a day (BID) | ORAL | 0 refills | Status: DC

## 2020-10-07 NOTE — Telephone Encounter (Signed)
PMP done; last filled 09/08/20 please advise

## 2020-12-05 ENCOUNTER — Other Ambulatory Visit: Payer: Self-pay | Admitting: Internal Medicine

## 2020-12-05 MED ORDER — AMPHETAMINE-DEXTROAMPHETAMINE 30 MG PO TABS: 30.0000 mg | ORAL_TABLET | Freq: Two times a day (BID) | ORAL | 0 refills | Status: DC

## 2021-01-06 ENCOUNTER — Other Ambulatory Visit: Payer: Self-pay | Admitting: Internal Medicine

## 2021-01-06 MED ORDER — AMPHETAMINE-DEXTROAMPHETAMINE 30 MG PO TABS: 30.0000 mg | ORAL_TABLET | Freq: Two times a day (BID) | ORAL | 0 refills | Status: DC

## 2021-02-10 ENCOUNTER — Other Ambulatory Visit: Payer: Self-pay | Admitting: Internal Medicine

## 2021-02-10 MED ORDER — AMPHETAMINE-DEXTROAMPHETAMINE 30 MG PO TABS: 30.0000 mg | ORAL_TABLET | Freq: Two times a day (BID) | ORAL | 0 refills | Status: DC

## 2021-03-08 ENCOUNTER — Other Ambulatory Visit: Payer: Self-pay | Admitting: Internal Medicine

## 2021-03-09 MED ORDER — AMPHETAMINE-DEXTROAMPHETAMINE 30 MG PO TABS
30.0000 mg | ORAL_TABLET | Freq: Two times a day (BID) | ORAL | 0 refills | Status: DC
Start: 2021-03-09 — End: 2021-04-07

## 2021-03-09 MED ORDER — CYCLOBENZAPRINE HCL 5 MG PO TABS: 5.0000 mg | ORAL_TABLET | Freq: Three times a day (TID) | ORAL | 1 refills | Status: DC | PRN

## 2021-03-09 NOTE — Telephone Encounter (Signed)
Both done erx 

## 2021-04-07 ENCOUNTER — Other Ambulatory Visit: Payer: Self-pay | Admitting: Internal Medicine

## 2021-04-07 MED ORDER — AMPHETAMINE-DEXTROAMPHETAMINE 30 MG PO TABS
30.0000 mg | ORAL_TABLET | Freq: Two times a day (BID) | ORAL | 0 refills | Status: DC
Start: 2021-04-07 — End: 2021-05-13

## 2021-04-27 ENCOUNTER — Encounter: Payer: Self-pay | Admitting: Internal Medicine

## 2021-05-13 ENCOUNTER — Other Ambulatory Visit: Payer: Self-pay | Admitting: Internal Medicine

## 2021-05-14 MED ORDER — AMPHETAMINE-DEXTROAMPHETAMINE 30 MG PO TABS
30.0000 mg | ORAL_TABLET | Freq: Two times a day (BID) | ORAL | 0 refills | Status: DC
Start: 2021-05-14 — End: 2021-06-16

## 2021-06-16 ENCOUNTER — Other Ambulatory Visit: Payer: Self-pay | Admitting: Internal Medicine

## 2021-06-16 MED ORDER — AMPHETAMINE-DEXTROAMPHETAMINE 30 MG PO TABS
30.0000 mg | ORAL_TABLET | Freq: Two times a day (BID) | ORAL | 0 refills | Status: DC
Start: 2021-06-16 — End: 2021-07-03

## 2021-07-03 ENCOUNTER — Other Ambulatory Visit: Payer: Self-pay | Admitting: Internal Medicine

## 2021-07-03 MED ORDER — AMPHETAMINE-DEXTROAMPHETAMINE 30 MG PO TABS
30.0000 mg | ORAL_TABLET | Freq: Two times a day (BID) | ORAL | 0 refills | Status: DC
Start: 2021-07-03 — End: 2021-08-19

## 2021-08-19 ENCOUNTER — Other Ambulatory Visit: Payer: Self-pay | Admitting: Internal Medicine

## 2021-08-19 MED ORDER — AMPHETAMINE-DEXTROAMPHETAMINE 30 MG PO TABS: 30.0000 mg | ORAL_TABLET | Freq: Two times a day (BID) | ORAL | 0 refills | Status: DC

## 2021-09-28 ENCOUNTER — Other Ambulatory Visit: Payer: Self-pay | Admitting: Internal Medicine

## 2021-09-28 MED ORDER — AMPHETAMINE-DEXTROAMPHETAMINE 30 MG PO TABS
30.0000 mg | ORAL_TABLET | Freq: Two times a day (BID) | ORAL | 0 refills | Status: DC
Start: 2021-09-28 — End: 2021-10-27

## 2021-10-27 ENCOUNTER — Other Ambulatory Visit: Payer: Self-pay | Admitting: Internal Medicine

## 2021-10-27 MED ORDER — AMPHETAMINE-DEXTROAMPHETAMINE 30 MG PO TABS
30.0000 mg | ORAL_TABLET | Freq: Two times a day (BID) | ORAL | 0 refills | Status: DC
Start: 2021-10-27 — End: 2021-11-23

## 2021-11-23 ENCOUNTER — Other Ambulatory Visit: Payer: Self-pay | Admitting: Internal Medicine

## 2021-11-24 MED ORDER — AMPHETAMINE-DEXTROAMPHETAMINE 30 MG PO TABS: 30.0000 mg | ORAL_TABLET | Freq: Two times a day (BID) | ORAL | 0 refills | Status: DC

## 2021-12-24 ENCOUNTER — Telehealth: Payer: Self-pay | Admitting: Internal Medicine

## 2021-12-24 ENCOUNTER — Other Ambulatory Visit: Payer: Self-pay | Admitting: Internal Medicine

## 2021-12-24 NOTE — Telephone Encounter (Signed)
Pt called to voice confusion as to why his ADDERALL 30 MG has been denied.  Informed pt he has not been seen in over a year. Pt said we dropped the ball in communication and we should inform patients the expectation for appointments and refills; providers should also send patient reminders when it is time to schedule.  Pt scheduled virtual appointment 12/30/21.  Pt requesting short supply until appointment.  Lamar  Patient phone [phone]

## 2021-12-29 NOTE — Telephone Encounter (Signed)
Appointment 12/30/21 provider will address at that visit

## 2021-12-30 ENCOUNTER — Encounter: Payer: Self-pay | Admitting: Internal Medicine

## 2021-12-30 ENCOUNTER — Telehealth (INDEPENDENT_AMBULATORY_CARE_PROVIDER_SITE_OTHER): Payer: Self-pay | Admitting: Internal Medicine

## 2021-12-30 DIAGNOSIS — Z0000 Encounter for general adult medical examination without abnormal findings: Secondary | ICD-10-CM

## 2021-12-30 DIAGNOSIS — Z1159 Encounter for screening for other viral diseases: Secondary | ICD-10-CM

## 2021-12-30 DIAGNOSIS — Z114 Encounter for screening for human immunodeficiency virus [HIV]: Secondary | ICD-10-CM

## 2021-12-30 MED ORDER — AMPHETAMINE-DEXTROAMPHETAMINE 30 MG PO TABS: 30.0000 mg | ORAL_TABLET | Freq: Two times a day (BID) | ORAL | 0 refills | Status: DC

## 2021-12-30 NOTE — Patient Instructions (Addendum)
Please have the Tdap tetanus shot done at the pharmacy  Please continue all other medications as before, and refills have been done if requested - adderall  Please have the pharmacy call with any other refills you may need.  Please continue your efforts at being more active, low cholesterol diet, and weight control.  You are otherwise up to date with prevention measures today.  Please keep your appointments with your specialists as you may have planned  [facility] to forward your lab by Mychart  Please make an Appointment to return for your 1 year visit, or sooner if needed

## 2021-12-30 NOTE — Assessment & Plan Note (Signed)
Age and sex appropriate education and counseling updated with regular exercise and diet Referrals for preventative services - none needed Immunizations addressed - for tdap at the pharmacy, declines flu shot Smoking counseling  - none needed Evidence for depression or other mood disorder - none significant Most recent labs reviewed. I have personally reviewed and have noted: 1) the patient's medical and social history 2) The patient's current medications and supplements 3) The patient's height, weight, and BMI have been recorded in the chart

## 2021-12-30 NOTE — Progress Notes (Signed)
Patient ID: Isaiah Gibson, male   DOB: Sep 27, 1985, 36 y.o.   MRN: [id number]  Virtual Visit via Video Note  I connected with Isaiah Gibson on 12/30/21 at  8:40 AM EST by a video enabled telemedicine application and verified that I am speaking with the correct person using two identifiers.  Locationor all participants today Patient: at home Provider: at office   I discussed the limitations of evaluation and management by telemedicine and the availability of in person appointments. The patient expressed understanding and agreed to proceed.          Chief Complaint:: wellness exam and ADD       HPI:  Isaiah Gibson is a 36 y.o. male here for wellness exam; delclines flu shot but will have Tdap at the pharmacy, o/w up to date                        Also cont;s to have every 8 wks testosterone levels done on current tharapy, as well as hgb that has been mildly high recently, and plans to donate blood soon to correct this.  Adderall working well for ADD purposes, conts working full time.  Pt denies chest pain, increased sob or doe, wheezing, orthopnea, PND, increased LE swelling, palpitations, dizziness or syncope.   Pt denies polydipsia, polyuria, or new focal neuro s/s.    Wt Readings from Last 3 Encounters:  09/11/20 264 lb 9.6 oz (120 kg)  04/15/20 269 lb (122 kg)  09/27/19 252 lb (114.3 kg)   BP Readings from Last 3 Encounters:  09/11/20 122/90  04/15/20 124/80  01/25/20 (!) 160/90   Immunization History  Administered Date(s) Administered   PPD Test 04/20/2017   Td 12/09/2009   Health Maintenance Due  Topic Date Due   Hepatitis C Screening  Never done   TETANUS/TDAP  12/10/2019      Past Medical History:  Diagnosis Date   ADD 10/20/2006   BACK PAIN 05/15/2007   GERD 10/20/2006   HEMATOCHEZIA 12/18/2009   HYPERTENSION 01/25/2008   INSOMNIA-SLEEP DISORDER-UNSPEC 05/15/2007   Palpitations 01/25/2008   POLYCYTHEMIA 05/20/2009   PSORIASIS 10/20/2006   SMOKER 05/15/2007   VENEREAL  WART 05/15/2007   History reviewed. No pertinent surgical history.  reports that he quit smoking about 7 years ago. His smoking use included cigarettes. He has never used smokeless tobacco. He reports current alcohol use. He reports that he does not use drugs. family history includes Hypertension in an other family member. No Known Allergies Current Outpatient Medications on File Prior to Visit  Medication Sig Dispense Refill   Aspirin-Acetaminophen-Caffeine (GOODY HEADACHE PO) Take 1 packet by mouth daily as needed (pain).     Chorionic Gonadotropin (HCG) 6000 units SOLR Inject as directed.     cyclobenzaprine (FLEXERIL) 5 MG tablet Take 1 tablet (5 mg total) by mouth 3 (three) times daily as needed for muscle spasms. 90 tablet 1   ibuprofen (ADVIL,MOTRIN) 200 MG tablet Take 400 mg by mouth every 6 (six) hours as needed for moderate pain.     Risankizumab-rzaa,150 MG Dose, (SKYRIZI, 150 MG DOSE,) 75 MG/0.83ML PSKT Inject 1 each into the skin every 3 (three) months.     No current facility-administered medications on file prior to visit.        ROS:  All others reviewed and negative.  Objective        PE:  There were no vitals taken for this visit. Alert, NAD,  appropriate mood and affect, resps normal, cn 2-12 intact, moves all 4s, no visible rash or swelling                Micro: none  Cardiac tracings I have personally interpreted today:  none  Pertinent Radiological findings (summarize): none   Lab Results  Component Value Date   WBC 12.6 (H) 03/07/2019   HGB 17.8 (H) 03/07/2019   HCT 52.0 03/07/2019   PLT 418.0 (H) 03/07/2019   GLUCOSE 95 03/07/2019   CHOL 156 03/07/2019   TRIG 138.0 03/07/2019   HDL 42.90 03/07/2019   LDLCALC 86 03/07/2019   ALT 26 03/07/2019   AST 17 03/07/2019   NA 137 03/07/2019   K 3.8 03/07/2019   CL 102 03/07/2019   CREATININE 0.86 03/07/2019   BUN 10 03/07/2019   CO2 25 03/07/2019   TSH 1.31 03/07/2019   Assessment/Plan:  Isaiah Gibson is  a 36 y.o. [place] or Caucasian [1] male with  has a past medical history of ADD (10/20/2006), BACK PAIN (05/15/2007), GERD (10/20/2006), HEMATOCHEZIA (12/18/2009), HYPERTENSION (01/25/2008), INSOMNIA-SLEEP DISORDER-UNSPEC (05/15/2007), Palpitations (01/25/2008), POLYCYTHEMIA (05/20/2009), PSORIASIS (10/20/2006), SMOKER (05/15/2007), and VENEREAL WART (05/15/2007).  Preventative health care Age and sex appropriate education and counseling updated with regular exercise and diet Referrals for preventative services - none needed Immunizations addressed - for tdap at the pharmacy, declines flu shot Smoking counseling  - none needed Evidence for depression or other mood disorder - none significant Most recent labs reviewed. I have personally reviewed and have noted: 1) the patient's medical and social history 2) The patient's current medications and supplements 3) The patient's height, weight, and BMI have been recorded in the chart  Followup: Return in about 1 year (around 12/31/2022).  Assessment and Plan: See notes  Follow Up Instructions: See notes   I discussed the assessment and treatment plan with the patient. The patient was provided an opportunity to ask questions and all were answered. The patient agreed with the plan and demonstrated an understanding of the instructions.   The patient was advised to call back or seek an in-person evaluation if the symptoms worsen or if the condition fails to improve as anticipated.   Oliver Barre, MD 12/30/2021 9:21 AM [facility] - [facility] Internal Medicine

## 2022-01-05 ENCOUNTER — Telehealth: Payer: Self-pay

## 2022-01-05 NOTE — Telephone Encounter (Signed)
PA approved for amphetamine-dextroamphetamine

## 2022-01-28 ENCOUNTER — Other Ambulatory Visit: Payer: Self-pay | Admitting: Internal Medicine

## 2022-01-29 MED ORDER — AMPHETAMINE-DEXTROAMPHETAMINE 30 MG PO TABS: 30.0000 mg | ORAL_TABLET | Freq: Two times a day (BID) | ORAL | 0 refills | Status: DC

## 2022-03-01 ENCOUNTER — Other Ambulatory Visit: Payer: Self-pay | Admitting: Internal Medicine

## 2022-03-01 MED ORDER — AMPHETAMINE-DEXTROAMPHETAMINE 30 MG PO TABS: 30.0000 mg | ORAL_TABLET | Freq: Two times a day (BID) | ORAL | 0 refills | Status: DC

## 2022-03-30 ENCOUNTER — Other Ambulatory Visit: Payer: Self-pay | Admitting: Internal Medicine

## 2022-03-30 MED ORDER — AMPHETAMINE-DEXTROAMPHETAMINE 30 MG PO TABS: 30.0000 mg | ORAL_TABLET | Freq: Two times a day (BID) | ORAL | 0 refills | Status: DC

## 2022-05-05 ENCOUNTER — Other Ambulatory Visit: Payer: Self-pay | Admitting: Internal Medicine

## 2022-05-06 MED ORDER — AMPHETAMINE-DEXTROAMPHETAMINE 30 MG PO TABS: 30.0000 mg | ORAL_TABLET | Freq: Two times a day (BID) | ORAL | 0 refills | Status: DC

## 2022-05-31 ENCOUNTER — Other Ambulatory Visit: Payer: Self-pay | Admitting: Internal Medicine

## 2022-05-31 MED ORDER — AMPHETAMINE-DEXTROAMPHETAMINE 30 MG PO TABS: 30.0000 mg | ORAL_TABLET | Freq: Two times a day (BID) | ORAL | 0 refills | Status: DC

## 2022-07-08 ENCOUNTER — Other Ambulatory Visit: Payer: Self-pay | Admitting: Internal Medicine

## 2022-07-08 MED ORDER — AMPHETAMINE-DEXTROAMPHETAMINE 30 MG PO TABS: 30.0000 mg | ORAL_TABLET | Freq: Two times a day (BID) | ORAL | 0 refills | Status: DC

## 2022-07-14 ENCOUNTER — Ambulatory Visit: Payer: BC Managed Care – PPO | Admitting: Internal Medicine

## 2022-07-21 ENCOUNTER — Telehealth: Payer: Self-pay

## 2022-07-21 ENCOUNTER — Ambulatory Visit: Payer: BC Managed Care – PPO | Admitting: Internal Medicine

## 2022-07-21 VITALS — BP 128/88 | HR 87 | Temp 98.3°F | Ht 72.0 in | Wt 282.0 lb

## 2022-07-21 DIAGNOSIS — Z125 Encounter for screening for malignant neoplasm of prostate: Secondary | ICD-10-CM

## 2022-07-21 DIAGNOSIS — E559 Vitamin D deficiency, unspecified: Secondary | ICD-10-CM

## 2022-07-21 DIAGNOSIS — R739 Hyperglycemia, unspecified: Secondary | ICD-10-CM | POA: Diagnosis not present

## 2022-07-21 DIAGNOSIS — Z0001 Encounter for general adult medical examination with abnormal findings: Secondary | ICD-10-CM | POA: Diagnosis not present

## 2022-07-21 DIAGNOSIS — E785 Hyperlipidemia, unspecified: Secondary | ICD-10-CM | POA: Diagnosis not present

## 2022-07-21 DIAGNOSIS — Z114 Encounter for screening for human immunodeficiency virus [HIV]: Secondary | ICD-10-CM

## 2022-07-21 DIAGNOSIS — E611 Iron deficiency: Secondary | ICD-10-CM

## 2022-07-21 DIAGNOSIS — D751 Secondary polycythemia: Secondary | ICD-10-CM | POA: Diagnosis not present

## 2022-07-21 DIAGNOSIS — F909 Attention-deficit hyperactivity disorder, unspecified type: Secondary | ICD-10-CM

## 2022-07-21 DIAGNOSIS — E291 Testicular hypofunction: Secondary | ICD-10-CM

## 2022-07-21 DIAGNOSIS — E669 Obesity, unspecified: Secondary | ICD-10-CM | POA: Diagnosis not present

## 2022-07-21 DIAGNOSIS — E538 Deficiency of other specified B group vitamins: Secondary | ICD-10-CM

## 2022-07-21 DIAGNOSIS — Z1159 Encounter for screening for other viral diseases: Secondary | ICD-10-CM

## 2022-07-21 LAB — LIPID PANEL
Cholesterol: 151 mg/dL (ref 0–200)
HDL: 43.8 mg/dL (ref >39.00)
LDL Cholesterol: 80 mg/dL (ref 0–99)
NonHDL: 106.73
Total CHOL/HDL Ratio: 3
Triglycerides: 134 mg/dL (ref 0.0–149.0)
VLDL: 26.8 mg/dL (ref 0.0–40.0)

## 2022-07-21 LAB — CBC WITH DIFFERENTIAL/PLATELET
Basophils Absolute: 0.1 10*3/uL (ref 0.0–0.1)
Basophils Relative: 1.1 % (ref 0.0–3.0)
Eosinophils Absolute: 0.2 10*3/uL (ref 0.0–0.7)
Eosinophils Relative: 2.3 % (ref 0.0–5.0)
HCT: 53.7 % — ABNORMAL HIGH (ref 39.0–52.0)
Hemoglobin: 18.7 g/dL (ref 13.0–17.0)
Lymphocytes Relative: 27.2 % (ref 12.0–46.0)
Lymphs Abs: 2.1 10*3/uL (ref 0.7–4.0)
MCHC: 34.7 g/dL (ref 30.0–36.0)
MCV: 89.6 fl (ref 78.0–100.0)
Monocytes Absolute: 0.6 10*3/uL (ref 0.1–1.0)
Monocytes Relative: 8 % (ref 3.0–12.0)
Neutro Abs: 4.9 10*3/uL (ref 1.4–7.7)
Neutrophils Relative %: 61.4 % (ref 43.0–77.0)
Platelets: 298 10*3/uL (ref 150.0–400.0)
RBC: 5.99 Mil/uL — ABNORMAL HIGH (ref 4.22–5.81)
RDW: 13.9 % (ref 11.5–15.5)
WBC: 7.9 10*3/uL (ref 4.0–10.5)

## 2022-07-21 LAB — HEPATIC FUNCTION PANEL
ALT: 46 U/L (ref 0–53)
AST: 30 U/L (ref 0–37)
Albumin: 4.4 g/dL (ref 3.5–5.2)
Alkaline Phosphatase: 67 U/L (ref 39–117)
Bilirubin, Direct: 0.2 mg/dL (ref 0.0–0.3)
Total Bilirubin: 0.8 mg/dL (ref 0.2–1.2)
Total Protein: 7.6 g/dL (ref 6.0–8.3)

## 2022-07-21 LAB — VITAMIN D 25 HYDROXY (VIT D DEFICIENCY, FRACTURES): VITD: 25.93 ng/mL — ABNORMAL LOW (ref 30.00–100.00)

## 2022-07-21 LAB — BASIC METABOLIC PANEL
BUN: 13 mg/dL (ref 6–23)
CO2: 30 mEq/L (ref 19–32)
Calcium: 9.6 mg/dL (ref 8.4–10.5)
Chloride: 100 mEq/L (ref 96–112)
Creatinine, Ser: 0.91 mg/dL (ref 0.40–1.50)
GFR: 106.35 mL/min (ref >60.00)
Glucose, Bld: 106 mg/dL — ABNORMAL HIGH (ref 70–99)
Potassium: 4.1 mEq/L (ref 3.5–5.1)
Sodium: 136 mEq/L (ref 135–145)

## 2022-07-21 LAB — IBC PANEL
Iron: 203 ug/dL — ABNORMAL HIGH (ref 42–165)
Saturation Ratios: 56.4 % — ABNORMAL HIGH (ref 20.0–50.0)
TIBC: 359.8 ug/dL (ref 250.0–450.0)
Transferrin: 257 mg/dL (ref 212.0–360.0)

## 2022-07-21 LAB — VITAMIN B12: Vitamin B-12: 359 pg/mL (ref 211–911)

## 2022-07-21 LAB — HEMOGLOBIN A1C: Hgb A1c MFr Bld: 5.1 % (ref 4.6–6.5)

## 2022-07-21 LAB — FERRITIN: Ferritin: 26.1 ng/mL (ref 22.0–322.0)

## 2022-07-21 LAB — TSH: TSH: 2.65 u[IU]/mL (ref 0.35–5.50)

## 2022-07-21 MED ORDER — SEMAGLUTIDE-WEIGHT MANAGEMENT 1 MG/0.5ML ~~LOC~~ SOAJ: 1.0000 mg | SUBCUTANEOUS | 0 refills | Status: AC

## 2022-07-21 MED ORDER — SEMAGLUTIDE-WEIGHT MANAGEMENT 0.5 MG/0.5ML ~~LOC~~ SOAJ: 0.5000 mg | SUBCUTANEOUS | 0 refills | Status: AC

## 2022-07-21 MED ORDER — SEMAGLUTIDE-WEIGHT MANAGEMENT 0.25 MG/0.5ML ~~LOC~~ SOAJ: 0.2500 mg | SUBCUTANEOUS | 0 refills | Status: DC

## 2022-07-21 MED ORDER — SEMAGLUTIDE-WEIGHT MANAGEMENT 2.4 MG/0.75ML ~~LOC~~ SOAJ: 2.4000 mg | SUBCUTANEOUS | 5 refills | Status: AC

## 2022-07-21 MED ORDER — SEMAGLUTIDE-WEIGHT MANAGEMENT 1.7 MG/0.75ML ~~LOC~~ SOAJ: 1.7000 mg | SUBCUTANEOUS | 0 refills | Status: AC

## 2022-07-21 NOTE — Progress Notes (Unsigned)
Patient ID: Isaiah Gibson, male   DOB: [birth year], 37 y.o.   MRN: [id number]         Chief Complaint:: wellness exam and obesity, low testosterone and polycythemia, anxiety panic       HPI:  Isaiah Gibson is a 37 y.o. male here for wellness exam; for tdap at pharmacy, declines covid booster, for hep c and HIV screen, o/w up to date                        Also Pt denies chest pain, increased sob or doe, wheezing, orthopnea, PND, increased LE swelling, palpitations, dizziness or syncope.   Pt denies polydipsia, polyuria, or new focal neuro s/s.    Pt denies fever, wt loss, night sweats, loss of appetite, or other constitutional symptoms  Denies worsening depressive symptoms, suicidal ideation, or panic; has ongoing anxiety, declines need for change in tx.  Wt has increased over last yr, not clear why.  Unable to lose wt on his own, requests wegovy Wt Readings from Last 3 Encounters:  07/21/22 282 lb (127.9 kg)  09/11/20 264 lb 9.6 oz (120 kg)  04/15/20 269 lb (122 kg)   BP Readings from Last 3 Encounters:  07/21/22 128/88  09/11/20 122/90  04/15/20 124/80   Immunization History  Administered Date(s) Administered   PPD Test 04/20/2017, 07/03/2021   Td 12/09/2009   Health Maintenance Due  Topic Date Due   DTaP/Tdap/Td (2 - Tdap) 12/10/2019      Past Medical History:  Diagnosis Date   ADD 10/20/2006   BACK PAIN 05/15/2007   GERD 10/20/2006   HEMATOCHEZIA 12/18/2009   HYPERTENSION 01/25/2008   INSOMNIA-SLEEP DISORDER-UNSPEC 05/15/2007   Palpitations 01/25/2008   POLYCYTHEMIA 05/20/2009   PSORIASIS 10/20/2006   SMOKER 05/15/2007   VENEREAL WART 05/15/2007   History reviewed. No pertinent surgical history.  reports that he quit smoking about 7 years ago. His smoking use included cigarettes. He has never used smokeless tobacco. He reports current alcohol use. He reports that he does not use drugs. family history includes Hypertension in an other family member. No Known Allergies Current  Outpatient Medications on File Prior to Visit  Medication Sig Dispense Refill   amphetamine-dextroamphetamine (ADDERALL) 30 MG tablet Take 1 tablet by mouth 2 (two) times daily. 60 tablet 0   Aspirin-Acetaminophen-Caffeine (GOODY HEADACHE PO) Take 1 packet by mouth daily as needed (pain).     Chorionic Gonadotropin (HCG) 6000 units SOLR Inject as directed.     cyclobenzaprine (FLEXERIL) 5 MG tablet Take 1 tablet (5 mg total) by mouth 3 (three) times daily as needed for muscle spasms. 90 tablet 1   ibuprofen (ADVIL,MOTRIN) 200 MG tablet Take 400 mg by mouth every 6 (six) hours as needed for moderate pain.     Risankizumab-rzaa,150 MG Dose, (SKYRIZI, 150 MG DOSE,) 75 MG/0.83ML PSKT Inject 1 each into the skin every 3 (three) months.     testosterone cypionate (DEPOTESTOSTERONE CYPIONATE) 200 MG/ML injection Inject 200 mg into the muscle every 14 (fourteen) days.     No current facility-administered medications on file prior to visit.        ROS:  All others reviewed and negative.  Objective        PE:  BP 128/88 (BP Location: Left Arm, Patient Position: Sitting, Cuff Size: Normal)   Pulse 87   Temp 98.3 F (36.8 C) (Oral)   Ht 6' (1.829 m)   Wt 282 lb (127.9  kg)   SpO2 95%   BMI 38.25 kg/m                 Constitutional: Pt appears in NAD               HENT: Head: NCAT.                Right Ear: External ear normal.                 Left Ear: External ear normal.                Eyes: . Pupils are equal, round, and reactive to light. Conjunctivae and EOM are normal               Nose: without d/c or deformity               Neck: Neck supple. Gross normal ROM               Cardiovascular: Normal rate and regular rhythm.                 Pulmonary/Chest: Effort normal and breath sounds without rales or wheezing.                Abd:  Soft, NT, ND, + BS, no organomegaly               Neurological: Pt is alert. At baseline orientation, motor grossly intact               Skin: Skin is warm.  No rashes, no other new lesions, LE edema - none               Psychiatric: Pt behavior is normal without agitation , nervous depressed affect  Micro: none  Cardiac tracings I have personally interpreted today:  none  Pertinent Radiological findings (summarize): none   Lab Results  Component Value Date   WBC 7.9 07/21/2022   HGB 18.7 Repeated and verified X2. (HH) 07/21/2022   HCT 53.7 (H) 07/21/2022   PLT 298.0 07/21/2022   GLUCOSE 106 (H) 07/21/2022   CHOL 151 07/21/2022   TRIG 134.0 07/21/2022   HDL 43.80 07/21/2022   LDLCALC 80 07/21/2022   ALT 46 07/21/2022   AST 30 07/21/2022   NA 136 07/21/2022   K 4.1 07/21/2022   CL 100 07/21/2022   CREATININE 0.91 07/21/2022   BUN 13 07/21/2022   CO2 30 07/21/2022   TSH 2.65 07/21/2022   HGBA1C 5.1 07/21/2022   Assessment/Plan:  Isaiah Gibson is a 37 y.o. [place] or Caucasian [1] male with  has a past medical history of ADD (10/20/2006), BACK PAIN (05/15/2007), GERD (10/20/2006), HEMATOCHEZIA (12/18/2009), HYPERTENSION (01/25/2008), INSOMNIA-SLEEP DISORDER-UNSPEC (05/15/2007), Palpitations (01/25/2008), POLYCYTHEMIA (05/20/2009), PSORIASIS (10/20/2006), SMOKER (05/15/2007), and VENEREAL WART (05/15/2007).  Encounter for well adult exam with abnormal findings Age and sex appropriate education and counseling updated with regular exercise and diet Referrals for preventative services - for tdap at pharmacy, for hiv and hep c screen Immunizations addressed - declines covid booster Smoking counseling  - none needed Evidence for depression or other mood disorder - stable anxiety depresison Most recent labs reviewed. I have personally reviewed and have noted: 1) the patient's medical and social history 2) The patient's current medications and supplements 3) The patient's height, weight, and BMI have been recorded in the chart   Attention deficit disorder Stable, cont adderall as is,   Male hypogonadism For testosterone  level  POLYCYTHEMIA For cbc with labs, may need to hold testosterone if elevated, pt to cont regular blood donations  Obesity (BMI 35.0-39.9 without comorbidity) Pt ok for wegovy asd,  to f/u any worsening symptoms or concerns  Vitamin D deficiency Last vitamin D Lab Results  Component Value Date   VD25OH 25.93 (L) 07/21/2022   Low, to start oral replacement   Followup: Return in about 6 months (around 01/21/2023).  Oliver Barre, MD 07/22/2022 9:09 PM [facility]

## 2022-07-21 NOTE — Telephone Encounter (Signed)
CRITICAL VALUE STICKER  CRITICAL VALUE: Hemoglobin 18.7  RECEIVER (on-site recipient of call):Shaquala Broeker CMA  DATE & TIME NOTIFIED: 07/21/2022  MESSENGER (representative from lab): Clydie Braun  MD NOTIFIED: Dr Jonny Ruiz  TIME OF NOTIFICATION:12:30pm  RESPONSE:

## 2022-07-21 NOTE — Patient Instructions (Addendum)
Please take all new medication as prescribed - the semiglutide  Please continue all other medications as before, and refills have been done if requested.  Please have the pharmacy call with any other refills you may need.  Please continue your efforts at being more active, low cholesterol diet, and weight control.  You are otherwise up to date with prevention measures today.  Please keep your appointments with your specialists as you may have planned  Please go to the LAB at the blood drawing area for the tests to be done  You will be contacted by phone if any changes need to be made immediately.  Otherwise, you will receive a letter about your results with an explanation, but please check with MyChart first.  Please remember to sign up for MyChart if you have not done so, as this will be important to you in the future with finding out test results, communicating by private email, and scheduling acute appointments online when needed.  Please make an Appointment to return in 6 months, or sooner if needed

## 2022-07-22 ENCOUNTER — Encounter: Payer: Self-pay | Admitting: Internal Medicine

## 2022-07-22 ENCOUNTER — Telehealth: Payer: Self-pay

## 2022-07-22 DIAGNOSIS — E669 Obesity, unspecified: Secondary | ICD-10-CM | POA: Insufficient documentation

## 2022-07-22 DIAGNOSIS — E559 Vitamin D deficiency, unspecified: Secondary | ICD-10-CM | POA: Insufficient documentation

## 2022-07-22 LAB — HIV ANTIBODY (ROUTINE TESTING W REFLEX): HIV 1&2 Ab, 4th Generation: NONREACTIVE

## 2022-07-22 LAB — HEPATITIS C ANTIBODY: Hepatitis C Ab: NONREACTIVE

## 2022-07-22 NOTE — Assessment & Plan Note (Signed)
Pt ok for wegovy asd,  to f/u any worsening symptoms or concerns

## 2022-07-22 NOTE — Assessment & Plan Note (Signed)
Last vitamin D Lab Results  Component Value Date   VD25OH 25.93 (L) 07/21/2022   Low, to start oral replacement

## 2022-07-22 NOTE — Assessment & Plan Note (Signed)
For cbc with labs, may need to hold testosterone if elevated, pt to cont regular blood donations

## 2022-07-22 NOTE — Assessment & Plan Note (Addendum)
Age and sex appropriate education and counseling updated with regular exercise and diet Referrals for preventative services - for tdap at pharmacy, for hiv and hep c screen Immunizations addressed - declines covid booster Smoking counseling  - none needed Evidence for depression or other mood disorder - stable anxiety depresison Most recent labs reviewed. I have personally reviewed and have noted: 1) the patient's medical and social history 2) The patient's current medications and supplements 3) The patient's height, weight, and BMI have been recorded in the chart

## 2022-07-22 NOTE — Assessment & Plan Note (Signed)
Stable, cont adderall as is,

## 2022-07-22 NOTE — Telephone Encounter (Signed)
Pharmacy Patient Advocate Encounter   Received notification from [facility] that prior authorization for Wegovy 0.25MG /0.5ML auto-injectors is required/requested.   PA submitted to [facility] via CoverMyMeds Key or (Medicaid) confirmation # [id number] Status is pending

## 2022-07-22 NOTE — Assessment & Plan Note (Signed)
For testosterone level 

## 2022-07-23 ENCOUNTER — Other Ambulatory Visit (HOSPITAL_COMMUNITY): Payer: Self-pay

## 2022-07-23 NOTE — Telephone Encounter (Signed)
Patient Advocate Encounter  Prior Authorization for [employer] 0.25MG /0.5ML auto-injectors has been approved with [employer].    Effective dates: 07/23/22 through 02/22/23  Per [facility] test claim, copay for 28 days supply is $24.99 (with eVoucher)

## 2022-07-25 LAB — TESTOSTERONE, FREE, TOTAL, SHBG
Sex Hormone Binding: 28.1 nmol/L (ref 16.5–55.9)
Testosterone, Free: 9.8 pg/mL (ref 8.7–25.1)
Testosterone: 399 ng/dL (ref 264–916)

## 2022-07-30 ENCOUNTER — Other Ambulatory Visit: Payer: Self-pay | Admitting: Internal Medicine

## 2022-07-30 MED ORDER — AMPHETAMINE-DEXTROAMPHETAMINE 30 MG PO TABS: 30.0000 mg | ORAL_TABLET | Freq: Two times a day (BID) | ORAL | 0 refills | Status: DC

## 2022-08-02 ENCOUNTER — Other Ambulatory Visit (HOSPITAL_COMMUNITY): Payer: Self-pay

## 2022-08-02 ENCOUNTER — Telehealth: Payer: Self-pay

## 2022-08-02 NOTE — Telephone Encounter (Signed)
*  Primary  PA request received via CMM for Wegovy 0.5MG /0.5ML auto-injectors  PA submitted and approved due to previous approval for 0.25mg  dose  Key: [id number]  Per test claim at this time co-pay is $24.99 through [facility].  Approved through 02/22/2023

## 2022-08-26 ENCOUNTER — Other Ambulatory Visit (HOSPITAL_COMMUNITY): Payer: Self-pay

## 2022-08-30 ENCOUNTER — Other Ambulatory Visit: Payer: Self-pay | Admitting: Internal Medicine

## 2022-09-06 ENCOUNTER — Other Ambulatory Visit: Payer: Self-pay | Admitting: Internal Medicine

## 2022-09-06 MED ORDER — AMPHETAMINE-DEXTROAMPHETAMINE 30 MG PO TABS: 30.0000 mg | ORAL_TABLET | Freq: Two times a day (BID) | ORAL | 0 refills | Status: DC

## 2022-09-07 ENCOUNTER — Telehealth: Payer: Self-pay | Admitting: Pharmacy Technician

## 2022-09-07 ENCOUNTER — Other Ambulatory Visit (HOSPITAL_COMMUNITY): Payer: Self-pay

## 2022-09-07 NOTE — Telephone Encounter (Signed)
Received another PA request via CMM. Per test claim copay 24.99. no pa needed at this time, even for new dose.

## 2022-09-12 ENCOUNTER — Telehealth: Payer: Self-pay

## 2022-09-12 ENCOUNTER — Other Ambulatory Visit (HOSPITAL_COMMUNITY): Payer: Self-pay

## 2022-09-12 NOTE — Telephone Encounter (Signed)
Pharmacy Patient Advocate Encounter  Received notification from [facility] that Prior Authorization for Amphetamine-Dextroamphetamine 30MG  tablets has been APPROVED from 09-10-2022 to 09-09-2025.[name]  PA #/Case ID/Reference #: [id number]  Co-pay is $10.00 per 30 day supply Quantity approved 60 tablets per 30 days

## 2022-09-16 ENCOUNTER — Other Ambulatory Visit (HOSPITAL_COMMUNITY): Payer: Self-pay

## 2022-09-29 ENCOUNTER — Other Ambulatory Visit: Payer: Self-pay | Admitting: Internal Medicine

## 2022-09-29 ENCOUNTER — Other Ambulatory Visit (HOSPITAL_COMMUNITY): Payer: Self-pay

## 2022-09-29 ENCOUNTER — Telehealth: Payer: Self-pay

## 2022-09-29 MED ORDER — TESTOSTERONE CYPIONATE 200 MG/ML IM SOLN: 200.0000 mg | INTRAMUSCULAR | 1 refills | Status: DC

## 2022-09-29 MED ORDER — AMPHETAMINE-DEXTROAMPHETAMINE 30 MG PO TABS: 30.0000 mg | ORAL_TABLET | Freq: Two times a day (BID) | ORAL | 0 refills | Status: DC

## 2022-09-29 NOTE — Telephone Encounter (Signed)
Pharmacy Patient Advocate Encounter  Received notification from [facility] that Prior Authorization for Testosterone Cypionate 200MG /ML intramuscular solution has been APPROVED from 09/29/22 to 09/28/25. Ran test claim, Copay is $10. This test claim was processed through [facility]- copay amounts may vary at other pharmacies due to pharmacy/plan contracts, or as the patient moves through the different stages of their insurance plan.   PA #/Case ID/Reference #: [id number]

## 2022-10-06 ENCOUNTER — Emergency Department (HOSPITAL_BASED_OUTPATIENT_CLINIC_OR_DEPARTMENT_OTHER): Payer: BC Managed Care – PPO

## 2022-10-06 ENCOUNTER — Encounter (HOSPITAL_BASED_OUTPATIENT_CLINIC_OR_DEPARTMENT_OTHER): Payer: Self-pay | Admitting: Emergency Medicine

## 2022-10-06 ENCOUNTER — Emergency Department (HOSPITAL_BASED_OUTPATIENT_CLINIC_OR_DEPARTMENT_OTHER)
Admission: EM | Admit: 2022-10-06 | Discharge: 2022-10-06 | Disposition: A | Discharge status: Home / Self Care | Payer: BC Managed Care – PPO | Attending: Emergency Medicine | Admitting: Emergency Medicine

## 2022-10-06 ENCOUNTER — Other Ambulatory Visit: Payer: Self-pay

## 2022-10-06 DIAGNOSIS — Z79899 Other long term (current) drug therapy: Secondary | ICD-10-CM | POA: Diagnosis not present

## 2022-10-06 DIAGNOSIS — Y9355 Activity, bike riding: Secondary | ICD-10-CM | POA: Diagnosis not present

## 2022-10-06 DIAGNOSIS — S42292A Other displaced fracture of upper end of left humerus, initial encounter for closed fracture: Secondary | ICD-10-CM | POA: Insufficient documentation

## 2022-10-06 DIAGNOSIS — Z7982 Long term (current) use of aspirin: Secondary | ICD-10-CM | POA: Insufficient documentation

## 2022-10-06 DIAGNOSIS — S43005A Unspecified dislocation of left shoulder joint, initial encounter: Secondary | ICD-10-CM | POA: Diagnosis not present

## 2022-10-06 DIAGNOSIS — I10 Essential (primary) hypertension: Secondary | ICD-10-CM | POA: Diagnosis not present

## 2022-10-06 DIAGNOSIS — M25512 Pain in left shoulder: Secondary | ICD-10-CM | POA: Diagnosis present

## 2022-10-06 DIAGNOSIS — S4292XA Fracture of left shoulder girdle, part unspecified, initial encounter for closed fracture: Secondary | ICD-10-CM

## 2022-10-06 MED ORDER — HYDROMORPHONE HCL 1 MG/ML IJ SOLN
1.0000 mg | Freq: Once | INTRAMUSCULAR | Status: AC
  Administered 2022-10-06: 1 mg via INTRAVENOUS
  Filled 2022-10-06: qty 1

## 2022-10-06 MED ORDER — OXYCODONE-ACETAMINOPHEN 5-325 MG PO TABS: 1.0000 | ORAL_TABLET | Freq: Four times a day (QID) | ORAL | 0 refills | Status: DC | PRN

## 2022-10-06 MED ORDER — FENTANYL CITRATE PF 50 MCG/ML IJ SOSY
50.0000 ug | PREFILLED_SYRINGE | Freq: Once | INTRAMUSCULAR | Status: AC
  Administered 2022-10-06: 50 ug via INTRAMUSCULAR
  Filled 2022-10-06: qty 1

## 2022-10-06 MED ORDER — PROPOFOL 10 MG/ML IV BOLUS
1.0000 mg/kg | Freq: Once | INTRAVENOUS | Status: AC
  Administered 2022-10-06: 127 mg via INTRAVENOUS
  Filled 2022-10-06: qty 20

## 2022-10-06 MED ORDER — PROPOFOL 10 MG/ML IV BOLUS
INTRAVENOUS | Status: AC
  Administered 2022-10-06: 40 mg
  Filled 2022-10-06: qty 20

## 2022-10-06 NOTE — ED Notes (Signed)
Patient transported to CT 

## 2022-10-06 NOTE — Sedation Documentation (Signed)
Pain 2/10 now

## 2022-10-06 NOTE — Discharge Instructions (Addendum)
You were seen in the emergency department for an arm injury.    You were found to have both a fracture and dislocation of the left shoulder. We were able to reduce the dislocation.  However, the orthopedic surgeon believes that you still will need surgery for the fracture.  I talked to Dr. August Saucer, and I have attached his contact information.  He would like to see you in the office tomorrow, so please give them a call as soon as they open at 8 AM.  I have sent some pain medication to your pharmacy.

## 2022-10-06 NOTE — Sedation Documentation (Signed)
100 mg propofol given by provider due to sedation wearing off

## 2022-10-06 NOTE — Sedation Documentation (Signed)
Pt desat to 84% on 4 L [place], RT to apply ambu bag mask with 100A%o2, SPO2 up to 100 with mask

## 2022-10-06 NOTE — Sedation Documentation (Signed)
Pt states little pain but unable to verbalized due to sedation

## 2022-10-06 NOTE — Sedation Documentation (Signed)
Another 40mg  propofol given by provider for sedation

## 2022-10-06 NOTE — Sedation Documentation (Signed)
Pt states pain but not able to verbalized a number.

## 2022-10-06 NOTE — ED Notes (Signed)
Total of 307mg  propofol given by provider during procedure

## 2022-10-06 NOTE — Sedation Documentation (Signed)
Radiology at bedside for XR left shoulder

## 2022-10-06 NOTE — ED Notes (Signed)
Pt states pain has returned since giving meds (see mar). Pain now 10/10 again

## 2022-10-06 NOTE — Sedation Documentation (Signed)
Sling applied to left arm, sensory intact, able to follow commands, XR called for STAT xr for left shoulder

## 2022-10-06 NOTE — Sedation Documentation (Signed)
Another 40mg  Propofol given by provider for further sedation
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
## 2022-10-06 NOTE — ED Notes (Signed)
Gave patient another ice pack for his shoulder pain management. Pt lying prone on the bed with family nearby for emotional support.

## 2022-10-06 NOTE — Sedation Documentation (Signed)
Lurena Joiner, pt wife to drive pt home.  Pt stable for discharge, pt in wheelchair to POV to home

## 2022-10-07 ENCOUNTER — Telehealth: Payer: Self-pay | Admitting: Orthopedic Surgery

## 2022-10-07 ENCOUNTER — Encounter (HOSPITAL_COMMUNITY): Payer: Self-pay | Admitting: Orthopedic Surgery

## 2022-10-07 ENCOUNTER — Other Ambulatory Visit: Payer: Self-pay

## 2022-10-07 ENCOUNTER — Telehealth: Payer: Self-pay | Admitting: Pharmacy Technician

## 2022-10-07 ENCOUNTER — Ambulatory Visit: Payer: BC Managed Care – PPO | Admitting: Orthopedic Surgery

## 2022-10-07 ENCOUNTER — Other Ambulatory Visit (HOSPITAL_COMMUNITY): Payer: Self-pay

## 2022-10-07 ENCOUNTER — Telehealth: Payer: Self-pay

## 2022-10-07 DIAGNOSIS — M25512 Pain in left shoulder: Secondary | ICD-10-CM | POA: Diagnosis not present

## 2022-10-07 NOTE — Transitions of Care (Post Inpatient/ED Visit) (Signed)
   10/07/2022  Name: Isaiah Gibson MRN: [id number] DOB: 1985-10-18  Today's TOC FU Call Status: Today's TOC FU Call Status:: Unsuccessful Call (1st Attempt) Unsuccessful Call (1st Attempt) Date: 10/07/22  Attempted to reach the patient regarding the most recent Inpatient/ED visit.  Follow Up Plan: Additional outreach attempts will be made to reach the patient to complete the Transitions of Care (Post Inpatient/ED visit) call.   Signature Karena Addison, LPN [facility] Nurse Health Advisor Direct Dial [phone]

## 2022-10-07 NOTE — Telephone Encounter (Signed)
Pharmacy Patient Advocate Encounter   Received notification from CoverMyMeds that prior authorization for Wegovy 1MG /0.5ML auto-injectors is required/requested.   Insurance verification completed.   The patient is insured through [facility] .   Per test claim:  pt copay is 24.99. PRIOR AUTH on file expires 02/22/2023. No new Prior Auth needed at this time

## 2022-10-07 NOTE — Telephone Encounter (Signed)
Looks like this pt is already on Dr. Alfonso Patten schedule for today

## 2022-10-07 NOTE — Progress Notes (Signed)
SDW call  Patient was given pre-op instructions over the phone. Patient verbalized understanding of instructions provided.    PCP - Dr. Oliver Barre Cardiologist - denies Pulmonary: denies   PPM/ICD - denies Device Orders - n/a Rep Notified - n/a   Chest x-ray - n/a EKG -  n/a Stress Test - ECHO -  Cardiac Cath -   Sleep Study/sleep apnea/CPAP: denies  Non-diabetic.  Patient is on Wegovy for weight management, states last dose 09/29/2022  Blood Thinner Instructions: denies  Aspirin Instructions:denies   ERAS Protcol - Yes, clear fluids until 1215   COVID TEST- n/a    Anesthesia review: Yes, polycythemia, on Wegovy.  Patient is urgent case on schedule due to fracture   Patient denies shortness of breath, fever, cough and chest pain over the phone call  Your procedure is scheduled on Friday October 08, 2022  Report to [facility] Main Entrance "A" at  1245 PM then check in with the Admitting office.  Call this number if you have problems the morning of surgery:  [phone]   If you have any questions prior to your surgery date call [phone]: Open Monday-Friday 8am-4pm If you experience any cold or flu symptoms such as cough, fever, chills, shortness of breath, etc. between now and your scheduled surgery, please notify us at the above number    Remember:  Do not eat after midnight the night before your surgery  You may drink clear liquids until 1215 the day of your surgery.   Clear liquids allowed are: Water, Non-Citrus Juices (without pulp), Carbonated Beverages, Clear Tea, Black Coffee ONLY (NO MILK, CREAM OR POWDERED CREAMER of any kind), and Gatorade   Take these medicines as needed the morning of surgery with A SIP OF WATER:  Flexeril, percocet  As of today, STOP taking any Aspirin (unless otherwise instructed by your surgeon) Aleve, Naproxen, Ibuprofen, Motrin, Advil, Goody's, BC's, all herbal medications, fish oil, and all vitamins.

## 2022-10-07 NOTE — Telephone Encounter (Signed)
Patient needs to be seen this AM per August Saucer in ED last night. [phone]

## 2022-10-08 ENCOUNTER — Encounter (HOSPITAL_COMMUNITY): Payer: Self-pay | Admitting: Orthopedic Surgery

## 2022-10-08 ENCOUNTER — Encounter: Payer: Self-pay | Admitting: Orthopedic Surgery

## 2022-10-08 ENCOUNTER — Ambulatory Visit (HOSPITAL_COMMUNITY): Payer: BC Managed Care – PPO

## 2022-10-08 ENCOUNTER — Ambulatory Visit (HOSPITAL_COMMUNITY): Payer: BC Managed Care – PPO | Admitting: Anesthesiology

## 2022-10-08 ENCOUNTER — Ambulatory Visit (HOSPITAL_COMMUNITY)
Admission: RE | Admit: 2022-10-08 | Discharge: 2022-10-08 | Disposition: A | Discharge status: Home / Self Care | Payer: BC Managed Care – PPO | Attending: Orthopedic Surgery | Admitting: Orthopedic Surgery

## 2022-10-08 ENCOUNTER — Other Ambulatory Visit: Payer: Self-pay

## 2022-10-08 ENCOUNTER — Encounter (HOSPITAL_COMMUNITY)
Admission: RE | Disposition: A | Discharge status: Home / Self Care | Payer: Self-pay | Source: Home / Self Care | Attending: Orthopedic Surgery

## 2022-10-08 DIAGNOSIS — Z01818 Encounter for other preprocedural examination: Secondary | ICD-10-CM

## 2022-10-08 DIAGNOSIS — S4292XD Fracture of left shoulder girdle, part unspecified, subsequent encounter for fracture with routine healing: Secondary | ICD-10-CM

## 2022-10-08 DIAGNOSIS — S4290XD Fracture of unspecified shoulder girdle, part unspecified, subsequent encounter for fracture with routine healing: Secondary | ICD-10-CM

## 2022-10-08 DIAGNOSIS — Z7985 Long-term (current) use of injectable non-insulin antidiabetic drugs: Secondary | ICD-10-CM | POA: Diagnosis not present

## 2022-10-08 DIAGNOSIS — F1290 Cannabis use, unspecified, uncomplicated: Secondary | ICD-10-CM | POA: Diagnosis not present

## 2022-10-08 DIAGNOSIS — S42252A Displaced fracture of greater tuberosity of left humerus, initial encounter for closed fracture: Secondary | ICD-10-CM | POA: Insufficient documentation

## 2022-10-08 DIAGNOSIS — F17290 Nicotine dependence, other tobacco product, uncomplicated: Secondary | ICD-10-CM | POA: Insufficient documentation

## 2022-10-08 DIAGNOSIS — K219 Gastro-esophageal reflux disease without esophagitis: Secondary | ICD-10-CM | POA: Diagnosis not present

## 2022-10-08 HISTORY — PX: ORIF SHOULDER FRACTURE: SHX5035

## 2022-10-08 LAB — BASIC METABOLIC PANEL
Anion gap: 15 (ref 5–15)
BUN: 8 mg/dL (ref 6–20)
CO2: 25 mmol/L (ref 22–32)
Calcium: 9.2 mg/dL (ref 8.9–10.3)
Chloride: 98 mmol/L (ref 98–111)
Creatinine, Ser: 0.86 mg/dL (ref 0.61–1.24)
GFR, Estimated: >60 mL/min (ref >60)
Glucose, Bld: 94 mg/dL (ref 70–99)
Potassium: 3.9 mmol/L (ref 3.5–5.1)
Sodium: 138 mmol/L (ref 135–145)

## 2022-10-08 LAB — CBC
HCT: 53.8 % — ABNORMAL HIGH (ref 39.0–52.0)
Hemoglobin: 18.6 g/dL — ABNORMAL HIGH (ref 13.0–17.0)
MCH: 30.9 pg (ref 26.0–34.0)
MCHC: 34.6 g/dL (ref 30.0–36.0)
MCV: 89.4 fL (ref 80.0–100.0)
Platelets: 300 10*3/uL (ref 150–400)
RBC: 6.02 MIL/uL — ABNORMAL HIGH (ref 4.22–5.81)
RDW: 13.1 % (ref 11.5–15.5)
WBC: 10.1 10*3/uL (ref 4.0–10.5)
nRBC: 0 % (ref 0.0–0.2)

## 2022-10-08 SURGERY — OPEN REDUCTION INTERNAL FIXATION (ORIF) SHOULDER FRACTURE
Anesthesia: General | Site: Shoulder | Laterality: Left

## 2022-10-08 MED ORDER — POVIDONE-IODINE 7.5 % EX SOLN
Freq: Once | CUTANEOUS | Status: DC
  Filled 2022-10-08: qty 118

## 2022-10-08 MED ORDER — OXYCODONE HCL 5 MG PO TABS
ORAL_TABLET | ORAL | Status: AC
  Filled 2022-10-08: qty 1

## 2022-10-08 MED ORDER — CEFAZOLIN IN SODIUM CHLORIDE 3-0.9 GM/100ML-% IV SOLN
3.0000 g | INTRAVENOUS | Status: AC
  Administered 2022-10-08: 3 g via INTRAVENOUS
  Filled 2022-10-08: qty 100

## 2022-10-08 MED ORDER — TRANEXAMIC ACID-NACL 1000-0.7 MG/100ML-% IV SOLN
1000.0000 mg | INTRAVENOUS | Status: AC
  Administered 2022-10-08: 1000 mg via INTRAVENOUS
  Filled 2022-10-08: qty 100

## 2022-10-08 MED ORDER — FENTANYL CITRATE (PF) 100 MCG/2ML IJ SOLN
INTRAMUSCULAR | Status: AC
  Administered 2022-10-08: 100 ug via INTRAVENOUS
  Filled 2022-10-08: qty 2

## 2022-10-08 MED ORDER — PROPOFOL 10 MG/ML IV BOLUS
INTRAVENOUS | Status: DC | PRN
  Administered 2022-10-08: 200 mg via INTRAVENOUS

## 2022-10-08 MED ORDER — PROPOFOL 10 MG/ML IV BOLUS
INTRAVENOUS | Status: AC
  Filled 2022-10-08: qty 20

## 2022-10-08 MED ORDER — POVIDONE-IODINE 10 % EX SWAB: Freq: Once | CUTANEOUS | Status: DC

## 2022-10-08 MED ORDER — OXYCODONE HCL 5 MG PO TABS
5.0000 mg | ORAL_TABLET | Freq: Once | ORAL | Status: AC
  Administered 2022-10-08: 5 mg via ORAL

## 2022-10-08 MED ORDER — LACTATED RINGERS IV SOLN: INTRAVENOUS | Status: DC

## 2022-10-08 MED ORDER — CHLORHEXIDINE GLUCONATE 0.12 % MT SOLN
OROMUCOSAL | Status: AC
  Administered 2022-10-08: 15 mL via OROMUCOSAL
  Filled 2022-10-08: qty 15

## 2022-10-08 MED ORDER — ONDANSETRON HCL 4 MG/2ML IJ SOLN
INTRAMUSCULAR | Status: DC | PRN
  Administered 2022-10-08: 4 mg via INTRAVENOUS

## 2022-10-08 MED ORDER — LIDOCAINE 2% (20 MG/ML) 5 ML SYRINGE
INTRAMUSCULAR | Status: AC
  Filled 2022-10-08: qty 5

## 2022-10-08 MED ORDER — VANCOMYCIN HCL 1000 MG IV SOLR
INTRAVENOUS | Status: DC | PRN
Start: 2022-10-08 — End: 2022-10-08
  Administered 2022-10-08: 1000 mg via TOPICAL

## 2022-10-08 MED ORDER — OXYCODONE HCL 5 MG PO TABS: 5.0000 mg | ORAL_TABLET | ORAL | 0 refills | Status: DC | PRN

## 2022-10-08 MED ORDER — PHENYLEPHRINE 80 MCG/ML (10ML) SYRINGE FOR IV PUSH (FOR BLOOD PRESSURE SUPPORT)
PREFILLED_SYRINGE | INTRAVENOUS | Status: DC | PRN
  Administered 2022-10-08 (×3): 80 ug via INTRAVENOUS

## 2022-10-08 MED ORDER — ORAL CARE MOUTH RINSE: 15.0000 mL | Freq: Once | OROMUCOSAL | Status: AC

## 2022-10-08 MED ORDER — 0.9 % SODIUM CHLORIDE (POUR BTL) OPTIME
TOPICAL | Status: DC | PRN
  Administered 2022-10-08: 1000 mL

## 2022-10-08 MED ORDER — ONDANSETRON HCL 4 MG/2ML IJ SOLN: 4.0000 mg | Freq: Once | INTRAMUSCULAR | Status: DC | PRN

## 2022-10-08 MED ORDER — POVIDONE-IODINE 10 % EX SWAB
2.0000 | Freq: Once | CUTANEOUS | Status: AC
  Administered 2022-10-08: 2 via TOPICAL

## 2022-10-08 MED ORDER — FENTANYL CITRATE (PF) 100 MCG/2ML IJ SOLN: 100.0000 ug | Freq: Once | INTRAMUSCULAR | Status: AC

## 2022-10-08 MED ORDER — CELECOXIB 100 MG PO CAPS
100.0000 mg | ORAL_CAPSULE | Freq: Two times a day (BID) | ORAL | 0 refills | Status: DC
Start: 2022-10-08 — End: 2022-10-20

## 2022-10-08 MED ORDER — MIDAZOLAM HCL 2 MG/2ML IJ SOLN
INTRAMUSCULAR | Status: AC
  Filled 2022-10-08: qty 2

## 2022-10-08 MED ORDER — CHLORHEXIDINE GLUCONATE 0.12 % MT SOLN: 15.0000 mL | Freq: Once | OROMUCOSAL | Status: AC

## 2022-10-08 MED ORDER — ROCURONIUM BROMIDE 10 MG/ML (PF) SYRINGE
PREFILLED_SYRINGE | INTRAVENOUS | Status: AC
  Filled 2022-10-08: qty 10

## 2022-10-08 MED ORDER — MIDAZOLAM HCL 5 MG/5ML IJ SOLN
INTRAMUSCULAR | Status: DC | PRN
  Administered 2022-10-08: 2 mg via INTRAVENOUS

## 2022-10-08 MED ORDER — MIDAZOLAM HCL 2 MG/2ML IJ SOLN: 2.0000 mg | Freq: Once | INTRAMUSCULAR | Status: AC

## 2022-10-08 MED ORDER — ONDANSETRON HCL 4 MG/2ML IJ SOLN
INTRAMUSCULAR | Status: AC
  Filled 2022-10-08: qty 2

## 2022-10-08 MED ORDER — FENTANYL CITRATE (PF) 250 MCG/5ML IJ SOLN
INTRAMUSCULAR | Status: DC | PRN
  Administered 2022-10-08: 50 ug via INTRAVENOUS
  Administered 2022-10-08 (×2): 100 ug via INTRAVENOUS

## 2022-10-08 MED ORDER — MIDAZOLAM HCL 2 MG/2ML IJ SOLN
INTRAMUSCULAR | Status: AC
  Administered 2022-10-08: 2 mg via INTRAVENOUS
  Filled 2022-10-08: qty 2

## 2022-10-08 MED ORDER — FENTANYL CITRATE (PF) 100 MCG/2ML IJ SOLN
25.0000 ug | INTRAMUSCULAR | Status: DC | PRN
  Administered 2022-10-08 (×2): 50 ug via INTRAVENOUS

## 2022-10-08 MED ORDER — SUCCINYLCHOLINE CHLORIDE 200 MG/10ML IV SOSY
PREFILLED_SYRINGE | INTRAVENOUS | Status: DC | PRN
  Administered 2022-10-08: 160 mg via INTRAVENOUS

## 2022-10-08 MED ORDER — ACETAMINOPHEN 500 MG PO TABS
1000.0000 mg | ORAL_TABLET | Freq: Once | ORAL | Status: AC
  Administered 2022-10-08: 1000 mg via ORAL
  Filled 2022-10-08: qty 2

## 2022-10-08 MED ORDER — SOD CITRATE-CITRIC ACID 500-334 MG/5ML PO SOLN
30.0000 mL | Freq: Once | ORAL | Status: AC
  Administered 2022-10-08: 30 mL via ORAL
  Filled 2022-10-08: qty 30

## 2022-10-08 MED ORDER — ROCURONIUM BROMIDE 10 MG/ML (PF) SYRINGE
PREFILLED_SYRINGE | INTRAVENOUS | Status: DC | PRN
  Administered 2022-10-08: 60 mg via INTRAVENOUS
  Administered 2022-10-08 (×2): 20 mg via INTRAVENOUS

## 2022-10-08 MED ORDER — FENTANYL CITRATE (PF) 250 MCG/5ML IJ SOLN
INTRAMUSCULAR | Status: AC
  Filled 2022-10-08: qty 5

## 2022-10-08 MED ORDER — SUGAMMADEX SODIUM 200 MG/2ML IV SOLN
INTRAVENOUS | Status: DC | PRN
  Administered 2022-10-08: 200 mg via INTRAVENOUS

## 2022-10-08 MED ORDER — SOD CITRATE-CITRIC ACID 500-334 MG/5ML PO SOLN
ORAL | Status: AC
  Filled 2022-10-08: qty 30

## 2022-10-08 MED ORDER — LIDOCAINE 2% (20 MG/ML) 5 ML SYRINGE
INTRAMUSCULAR | Status: DC | PRN
  Administered 2022-10-08: 100 mg via INTRAVENOUS

## 2022-10-08 MED ORDER — DEXAMETHASONE SODIUM PHOSPHATE 10 MG/ML IJ SOLN
INTRAMUSCULAR | Status: AC
  Filled 2022-10-08: qty 1

## 2022-10-08 MED ORDER — FENTANYL CITRATE (PF) 100 MCG/2ML IJ SOLN
INTRAMUSCULAR | Status: AC
  Filled 2022-10-08: qty 2

## 2022-10-08 MED ORDER — VANCOMYCIN HCL 1000 MG IV SOLR
INTRAVENOUS | Status: AC
  Filled 2022-10-08: qty 20

## 2022-10-08 MED ORDER — DEXAMETHASONE SODIUM PHOSPHATE 10 MG/ML IJ SOLN
INTRAMUSCULAR | Status: DC | PRN
  Administered 2022-10-08: 4 mg via INTRAVENOUS

## 2022-10-08 MED ORDER — AMISULPRIDE (ANTIEMETIC) 5 MG/2ML IV SOLN: 10.0000 mg | Freq: Once | INTRAVENOUS | Status: DC | PRN

## 2022-10-08 SURGICAL SUPPLY — 72 items
APL SKNCLS STERI-STRIP NONHPOA (GAUZE/BANDAGES/DRESSINGS) ×1
BAG COUNTER SPONGE SURGICOUNT (BAG) ×1 IMPLANT
BAG SPNG CNTER NS LX DISP (BAG) ×1
BENZOIN TINCTURE PRP APPL 2/3 (GAUZE/BANDAGES/DRESSINGS) ×1 IMPLANT
BIT DRILL 3.2 (BIT) ×1
BIT DRILL 3.2XCALB NS DISP (BIT) IMPLANT
BIT DRILL CALIBRATED 2.7 (BIT) IMPLANT
BIT DRL 3.2XCALB NS DISP (BIT) ×1
COVER SURGICAL LIGHT HANDLE (MISCELLANEOUS) ×1 IMPLANT
DRAIN PENROSE .5X12 LATEX STL (DRAIN) IMPLANT
DRAPE C-ARM 42X72 X-RAY (DRAPES) IMPLANT
DRAPE IMP U-DRAPE 54X76 (DRAPES) ×1 IMPLANT
DRAPE INCISE IOBAN 66X45 STRL (DRAPES) ×2 IMPLANT
DRAPE U-SHAPE 47X51 STRL (DRAPES) ×2 IMPLANT
DRSG AQUACEL AG ADV 3.5X10 (GAUZE/BANDAGES/DRESSINGS) IMPLANT
DRSG MEPILEX POST OP 4X12 (GAUZE/BANDAGES/DRESSINGS) IMPLANT
DRSG MEPILEX POST OP 4X8 (GAUZE/BANDAGES/DRESSINGS) ×1 IMPLANT
DURAPREP 26ML APPLICATOR (WOUND CARE) ×1 IMPLANT
ELECT REM PT RETURN 9FT ADLT (ELECTROSURGICAL) ×1
ELECTRODE REM PT RTRN 9FT ADLT (ELECTROSURGICAL) ×1 IMPLANT
FACESHIELD WRAPAROUND (MASK) ×1 IMPLANT
FACESHIELD WRAPAROUND OR TEAM (MASK) ×1 IMPLANT
GAUZE PAD ABD 8X10 STRL (GAUZE/BANDAGES/DRESSINGS) IMPLANT
GAUZE SPONGE 4X4 12PLY STRL (GAUZE/BANDAGES/DRESSINGS) IMPLANT
GAUZE XEROFORM 5X9 LF (GAUZE/BANDAGES/DRESSINGS) IMPLANT
GLOVE BIO SURGEON ST LM GN SZ9 (GLOVE) ×1 IMPLANT
GLOVE BIOGEL PI IND STRL 8 (GLOVE) ×1 IMPLANT
GLOVE ECLIPSE 8.0 STRL XLNG CF (GLOVE) ×1 IMPLANT
GOWN STRL REUS W/ TWL LRG LVL3 (GOWN DISPOSABLE) ×2 IMPLANT
GOWN STRL REUS W/ TWL XL LVL3 (GOWN DISPOSABLE) ×1 IMPLANT
GOWN STRL REUS W/TWL LRG LVL3 (GOWN DISPOSABLE) ×2
GOWN STRL REUS W/TWL XL LVL3 (GOWN DISPOSABLE) ×1
K-WIRE 2X5 SS THRDED S3 (WIRE) ×4
KIT BASIN OR (CUSTOM PROCEDURE TRAY) ×1 IMPLANT
KIT TURNOVER KIT B (KITS) ×1 IMPLANT
KWIRE 2X5 SS THRDED S3 (WIRE) IMPLANT
MANIFOLD NEPTUNE II (INSTRUMENTS) ×1 IMPLANT
NS IRRIG 1000ML POUR BTL (IV SOLUTION) ×1 IMPLANT
PACK SHOULDER (CUSTOM PROCEDURE TRAY) ×1 IMPLANT
PACK UNIVERSAL I (CUSTOM PROCEDURE TRAY) ×1 IMPLANT
PAD ARMBOARD 7.5X6 YLW CONV (MISCELLANEOUS) ×2 IMPLANT
PEG LOCKING 3.2MMX44 (Peg) IMPLANT
PEG LOCKING 3.2MMX46 (Peg) IMPLANT
PEG LOCKING 3.2X36 (Screw) IMPLANT
PEG LOCKING 3.2X48 (Peg) IMPLANT
PEG LOCKING 3.2X52 (Peg) IMPLANT
PENCIL BUTTON HOLSTER BLD 10FT (ELECTRODE) IMPLANT
PLATE PROX HUM HI L 3H 80 (Plate) IMPLANT
SCREW LOCK CORT STAR 3.5X26 (Screw) IMPLANT
SCREW LOCK CORT STAR 3.5X28 (Screw) IMPLANT
SCREW LOCK CORT STAR 3.5X42 (Screw) IMPLANT
SCREW LOCK CORT STAR 3.5X44 (Screw) IMPLANT
SCREW LOW PROF TIS 3.5X28MM (Screw) IMPLANT
SCREW T15 LP CORT 3.5X42MM NS (Screw) IMPLANT
SCREW T15 LP CORT 3.5X44MM NS (Screw) IMPLANT
SLEEVE MEASURING 3.2 (BIT) IMPLANT
SLING ARM IMMOBILIZER LRG (SOFTGOODS) IMPLANT
SLING ARM IMMOBILIZER MED (SOFTGOODS) IMPLANT
SPONGE T-LAP 4X18 ~~LOC~~+RFID (SPONGE) ×2 IMPLANT
STAPLER VISISTAT 35W (STAPLE) IMPLANT
STRIP CLOSURE SKIN 1/2X4 (GAUZE/BANDAGES/DRESSINGS) IMPLANT
SUCTION TUBE FRAZIER 10FR DISP (SUCTIONS) IMPLANT
SUT MNCRL+ AB 3-0 CT1 36 (SUTURE) IMPLANT
SUT VIC AB 1 CT1 27 (SUTURE) ×2
SUT VIC AB 1 CT1 27XBRD ANBCTR (SUTURE) IMPLANT
SUT VIC AB 2-0 CT2 27 (SUTURE) IMPLANT
SUT VIC AB 2-0 CTB1 (SUTURE) IMPLANT
TOWEL GREEN STERILE (TOWEL DISPOSABLE) ×1 IMPLANT
TOWEL GREEN STERILE FF (TOWEL DISPOSABLE) ×1 IMPLANT
TUBE CONNECTING 12X1/4 (SUCTIONS) IMPLANT
WATER STERILE IRR 1000ML POUR (IV SOLUTION) ×1 IMPLANT
YANKAUER SUCT BULB TIP NO VENT (SUCTIONS) IMPLANT
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
## 2022-10-08 NOTE — Anesthesia Procedure Notes (Signed)
Anesthesia Regional Block: Interscalene brachial plexus block   Pre-Anesthetic Checklist: , timeout performed,  Correct Patient, Correct Site, Correct Laterality,  Correct Procedure, Correct Position, site marked,  Risks and benefits discussed,  Surgical consent,  Pre-op evaluation,  At surgeon's request and post-op pain management  Laterality: Left  Prep: chloraprep       Needles:  Injection technique: Single-shot  Needle Type: Echogenic Needle     Needle Length: 9cm  Needle Gauge: 21     Additional Needles:   Procedures:,,,, ultrasound used (permanent image in chart),,    Narrative:  Start time: 10/08/2022 2:38 PM End time: 10/08/2022 2:44 PM Injection made incrementally with aspirations every 5 mL.  Performed by: Personally  Anesthesiologist: Collene Schlichter, MD  Additional Notes: No pain on injection. No increased resistance to injection. Injection made in 5cc increments.  Good needle visualization.  Patient tolerated procedure well.
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
## 2022-10-08 NOTE — Anesthesia Preprocedure Evaluation (Signed)
Anesthesia Evaluation  Patient identified by MRN, date of birth, ID band Patient awake    Reviewed: Allergy & Precautions, NPO status , Patient's Chart, lab work & pertinent test results  Airway Mallampati: III  TM Distance: >3 FB Neck ROM: Full    Dental  (+) Teeth Intact, Dental Advisory Given, Missing,    Pulmonary Current Smoker and Patient abstained from smoking.   Pulmonary exam normal breath sounds clear to auscultation       Cardiovascular negative cardio ROS Normal cardiovascular exam Rhythm:Regular Rate:Normal     Neuro/Psych  PSYCHIATRIC DISORDERS Anxiety     negative neurological ROS     GI/Hepatic Neg liver ROS,GERD  ,,  Endo/Other  Obesity   Renal/GU negative Renal ROS     Musculoskeletal Left greater tuberosity fracture   Abdominal   Peds  (+) ATTENTION DEFICIT DISORDER WITHOUT HYPERACTIVITY Hematology negative hematology ROS (+)   Anesthesia Other Findings Day of surgery medications reviewed with the patient.  Reproductive/Obstetrics                             Anesthesia Physical Anesthesia Plan  ASA: 2  Anesthesia Plan: General   Post-op Pain Management: Regional block* and Tylenol PO (pre-op)*   Induction: Intravenous  PONV Risk Score and Plan: 2 and Midazolam, Dexamethasone and Ondansetron  Airway Management Planned: Oral ETT  Additional Equipment:   Intra-op Plan:   Post-operative Plan: Extubation in OR  Informed Consent: I have reviewed the patients History and Physical, chart, labs and discussed the procedure including the risks, benefits and alternatives for the proposed anesthesia with the patient or authorized representative who has indicated his/her understanding and acceptance.     Dental advisory given  Plan Discussed with: CRNA  Anesthesia Plan Comments:        Anesthesia Quick Evaluation

## 2022-10-08 NOTE — Anesthesia Postprocedure Evaluation (Deleted)
Anesthesia Post Note  Patient: Isaiah Gibson  Procedure(s) Performed: OPEN REDUCTION INTERNAL FIXATION (ORIF) GREATER TUBEROSITY FRACTURE (Left: Shoulder)     Patient location during evaluation: PACU Anesthesia Type: General Level of consciousness: awake and alert Pain management: pain level controlled Vital Signs Assessment: post-procedure vital signs reviewed and stable Respiratory status: spontaneous breathing, nonlabored ventilation, respiratory function stable and patient connected to nasal cannula oxygen Cardiovascular status: blood pressure returned to baseline and stable Postop Assessment: no apparent nausea or vomiting Anesthetic complications: no   No notable events documented.  Last Vitals:  Vitals:   10/08/22 1945 10/08/22 2000  BP: (!) 149/105 (!) 146/94  Pulse: (!) 109 (!) 102  Resp: 13 10  Temp: (!) 36.4 C   SpO2: 95% (!) 89%    Last Pain:  Vitals:   10/08/22 2000  TempSrc:   PainSc: 5                  Mariann Barter

## 2022-10-08 NOTE — Transfer of Care (Addendum)
Immediate Anesthesia Transfer of Care Note  Patient: Isaiah Gibson  Procedure(s) Performed: OPEN REDUCTION INTERNAL FIXATION (ORIF) GREATER TUBEROSITY FRACTURE (Left: Shoulder)  Patient Location: PACU  Anesthesia Type:General  Level of Consciousness: drowsy  Airway & Oxygen Therapy: Patient Spontanous Breathing and Patient connected to face mask  Post-op Assessment: Report given to RN and Post -op Vital signs reviewed and stable  Post vital signs: Reviewed and stable  Last Vitals:  Vitals Value Taken Time  BP 149/105 10/08/22 1946  Temp 36.4 C 10/08/22 1945  Pulse 106 10/08/22 1957  Resp 17 10/08/22 1957  SpO2 88 % 10/08/22 1957  Vitals shown include unfiled device data.  Last Pain:  Vitals:   10/08/22 1450  TempSrc:   PainSc: 0-No pain      Patients Stated Pain Goal: 0 (10/08/22 1310)  Complications: No notable events documented.

## 2022-10-08 NOTE — ED Provider Notes (Signed)
  Physical Exam  BP (!) 105/94 (BP Location: Right Arm)   Pulse (!) 106   Temp 98 F (36.7 C) (Oral)   Resp (!) 21   Ht 6' (1.829 m)   Wt 127 kg   SpO2 98%   BMI 37.97 kg/m   Physical Exam  Procedures  .Sedation  Date/Time: 10/08/2022 10:35 PM  Performed by: Alvira Monday, MD Authorized by: Alvira Monday, MD   Consent:    Consent obtained:  Written   Consent given by:  Patient   Risks discussed:  Allergic reaction, inadequate sedation, dysrhythmia, nausea, vomiting, respiratory compromise necessitating ventilatory assistance and intubation, prolonged sedation necessitating reversal and prolonged hypoxia resulting in organ damage   Alternatives discussed:  Analgesia without sedation and regional anesthesia Universal protocol:    Immediately prior to procedure, a time out was called: no     Patient identity confirmed:  Verbally with patient Indications:    Procedure performed:  Dislocation reduction   Procedure necessitating sedation performed by:  Different physician Pre-sedation assessment:    Time since last food or drink:  6   ASA classification: class 2 - patient with mild systemic disease     Mouth opening:  3 or more finger widths   Thyromental distance:  4 finger widths   Mallampati score:  II - soft palate, uvula, fauces visible   Pre-sedation assessments completed and reviewed: airway patency, cardiovascular function, hydration status, mental status, nausea/vomiting, pain level, respiratory function and temperature   Immediate pre-procedure details:    Reviewed: vital signs and relevant labs/tests     Verified: bag valve mask available, emergency equipment available, intubation equipment available, IV patency confirmed and oxygen available   Procedure details (see MAR for exact dosages):    Preoxygenation:  Room air   Sedation:  Propofol   Intended level of sedation: deep   Analgesia:  Hydromorphone   Intra-procedure monitoring:  Blood pressure monitoring,  cardiac monitor, continuous capnometry, continuous pulse oximetry, frequent vital sign checks and frequent LOC assessments   Intra-procedure events: hypoxia     Intra-procedure management:  Airway repositioning and supplemental oxygen   Total Provider sedation time (minutes):  15 Post-procedure details:    Patient is stable for discharge or admission: yes     Procedure completion:  Tolerated well, no immediate complications   ED Course / MDM     Sedated for dislocation reduction, humeral head fracture present as well.        Alvira Monday, MD 10/08/22 2242

## 2022-10-08 NOTE — Addendum Note (Signed)
Addendum  created 10/08/22 2137 by Mariann Barter, MD   Clinical Note Signed, Delete clinical note

## 2022-10-08 NOTE — Anesthesia Procedure Notes (Addendum)
Procedure Name: Intubation Date/Time: 10/08/2022 4:23 PM  Performed by: Camillia Herter, CRNAPre-anesthesia Checklist: Patient identified, Emergency Drugs available, Suction available, Patient being monitored and Timeout performed Patient Re-evaluated:Patient Re-evaluated prior to induction Oxygen Delivery Method: Circle system utilized Preoxygenation: Pre-oxygenation with 100% oxygen Induction Type: IV induction Laryngoscope Size: Glidescope and 4 Grade View: Grade I Tube size: 7.5 mm Number of attempts: 1 Airway Equipment and Method: Stylet and Video-laryngoscopy Placement Confirmation: ETT inserted through vocal cords under direct vision, breath sounds checked- equal and bilateral and CO2 detector Tube secured with: Tape Dental Injury: Teeth and Oropharynx as per pre-operative assessment

## 2022-10-08 NOTE — Brief Op Note (Signed)
   10/08/2022  7:20 PM  PATIENT:  Isaiah Gibson  37 y.o. male  PRE-OPERATIVE DIAGNOSIS:  left proximal humerus fracture  POST-OPERATIVE DIAGNOSIS:  left proximal humerus fracture  PROCEDURE:  Procedure(s): OPEN REDUCTION INTERNAL FIXATION (ORIF) GREATER TUBEROSITY FRACTURE  SURGEON:  Surgeon(s): August Saucer Corrie Mckusick, MD  ASSISTANT: magnant pa  ANESTHESIA:   general  EBL: 100 ml    No intake/output data recorded.  BLOOD ADMINISTERED: none  DRAINS: none   LOCAL MEDICATIONS USED:  vanco  SPECIMEN:  No Specimen  COUNTS:  YES  TOURNIQUET:  * No tourniquets in log *  DICTATION: .Other Dictation: Dictation Number [id number]  PLAN OF CARE: Discharge to home after PACU  PATIENT DISPOSITION:  PACU - hemodynamically stable

## 2022-10-08 NOTE — Anesthesia Postprocedure Evaluation (Signed)
Anesthesia Post Note  Patient: Isaiah Gibson  Procedure(s) Performed: OPEN REDUCTION INTERNAL FIXATION (ORIF) GREATER TUBEROSITY FRACTURE (Left: Shoulder)     Patient location during evaluation: PACU Anesthesia Type: General Level of consciousness: awake and alert Pain management: pain level controlled Vital Signs Assessment: post-procedure vital signs reviewed and stable Respiratory status: spontaneous breathing, nonlabored ventilation and respiratory function stable Cardiovascular status: blood pressure returned to baseline and stable Postop Assessment: no apparent nausea or vomiting Anesthetic complications: no Comments: Pt c/o mild shortness of breath in PACU, oxygen sat 90% on room air and improving with IS use. No rhonchi or wheeze. Symptoms likely due to interscalene block and resultant diaphragmatic weakness. Counseled patient on timeline of expected resolution and return precautions. Stable for discharge.    No notable events documented.  Last Vitals:  Vitals:   10/08/22 2015 10/08/22 2030  BP: 132/83 (!) 146/93  Pulse: 92 (!) 106  Resp: 11 11  Temp: 36.8 C   SpO2: 90% 90%    Last Pain:  Vitals:   10/08/22 2015  TempSrc:   PainSc: 4                  Mariann Barter
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
## 2022-10-09 DIAGNOSIS — S4290XD Fracture of unspecified shoulder girdle, part unspecified, subsequent encounter for fracture with routine healing: Secondary | ICD-10-CM

## 2022-10-12 ENCOUNTER — Telehealth: Payer: Self-pay | Admitting: Internal Medicine

## 2022-10-12 ENCOUNTER — Other Ambulatory Visit: Payer: Self-pay | Admitting: Surgical

## 2022-10-12 ENCOUNTER — Telehealth: Payer: Self-pay | Admitting: Orthopedic Surgery

## 2022-10-12 MED ORDER — CYCLOBENZAPRINE HCL 5 MG PO TABS: 5.0000 mg | ORAL_TABLET | Freq: Three times a day (TID) | ORAL | 1 refills | Status: AC | PRN

## 2022-10-12 MED ORDER — OXYCODONE HCL 5 MG PO TABS: 5.0000 mg | ORAL_TABLET | ORAL | 0 refills | Status: DC | PRN

## 2022-10-12 NOTE — Transitions of Care (Post Inpatient/ED Visit) (Signed)
10/12/2022  Name: Isaiah Gibson MRN: [id number] DOB: [birth year]  Today's TOC FU Call Status: Today's TOC FU Call Status:: Successful TOC FU Call Completed Unsuccessful Call (1st Attempt) Date: 10/07/22 [facility] FU Call Complete Date: 10/12/22  Transition Care Management Follow-up Telephone Call Date of Discharge: 10/06/22 Discharge Facility: [facility] Type of Discharge: Emergency Department Reason for ED Visit: Other: (left shoulder fracture) How have you been since you were released from the hospital?: Better Any questions or concerns?: No  Items Reviewed: Did you receive and understand the discharge instructions provided?: No Medications obtained,verified, and reconciled?: Yes (Medications Reviewed) Any new allergies since your discharge?: No Dietary orders reviewed?: Yes Do you have support at home?: No  Medications Reviewed Today: Medications Reviewed Today     Reviewed by Karena Addison, LPN (Licensed Practical Nurse) on 10/12/22 at 1512  Med List Status: <None>   Medication Order Taking? Sig Documenting Provider Last Dose Status Informant  amphetamine-dextroamphetamine (ADDERALL) 30 MG tablet [id number] No Take 1 tablet by mouth 2 (two) times daily. Corwin Levins, MD 10/07/2022 Active   Aspirin-Acetaminophen-Caffeine (GOODY HEADACHE PO) [id number] No Take 1 packet by mouth daily as needed (pain). [provider] More than a month Active Self  celecoxib (CELEBREX) 100 MG capsule [id number]  Take 1 capsule (100 mg total) by mouth 2 (two) times daily. Magnant, Joycie Peek, PA-C  Active   Chorionic Gonadotropin (HCG) 6000 units SOLR [id number] No Inject as directed.  Patient not taking: Reported on 10/07/2022   [provider] Not Taking Active   cyclobenzaprine (FLEXERIL) 5 MG tablet [id number] No Take 1 tablet (5 mg total) by mouth 3 (three) times daily as needed for muscle spasms. Corwin Levins, MD 10/08/2022 0900 Active   cyclobenzaprine (FLEXERIL) 5 MG  tablet [id number]  Take 1 tablet (5 mg total) by mouth 3 (three) times daily as needed. Corwin Levins, MD  Active   oxyCODONE (ROXICODONE) 5 MG immediate release tablet [id number]  Take 1 tablet (5 mg total) by mouth every 4 (four) hours as needed for severe pain. Magnant, Charles L, PA-C  Active   Risankizumab-rzaa,150 MG Dose, (SKYRIZI, 150 MG DOSE,) 75 MG/0.83ML PSKT [id number] No Inject 1 each into the skin every 3 (three) months. [provider] More than a month Active   Semaglutide-Weight Management (WEGOVY) 0.25 MG/0.5ML Ivory Broad [id number] No INJECT 0.25 MG INTO THE SKIN ONCE A WEEK FOR 28 DAYS Corwin Levins, MD Past Week Active   Semaglutide-Weight Management 1 MG/0.5ML Ivory Broad [id number]  Inject 1 mg into the skin once a week for 28 days. Corwin Levins, MD  Active   Semaglutide-Weight Management 1.7 MG/0.75ML Ivory Broad [id number]  Inject 1.7 mg into the skin once a week for 28 days. Corwin Levins, MD  Active   Semaglutide-Weight Management 2.4 MG/0.75ML Ivory Broad [id number]  Inject 2.4 mg into the skin once a week for 28 days. Corwin Levins, MD  Active   testosterone cypionate (DEPOTESTOSTERONE CYPIONATE) 200 MG/ML injection [id number] No Inject 1 mL (200 mg total) into the muscle every 14 (fourteen) days.  Patient taking differently: Inject 200 mg into the muscle once a week.   Corwin Levins, MD 10/04/2022 Active             Home Care and Equipment/Supplies: Were Home Health Services Ordered?: NA Any new equipment or medical supplies ordered?: NA  Functional Questionnaire: Do you need assistance with bathing/showering or dressing?: No Do you need assistance with meal preparation?: No  Do you need assistance with eating?: No Do you have difficulty maintaining continence: No Do you need assistance with getting out of bed/getting out of a chair/moving?: No Do you have difficulty managing or taking your medications?: No  Follow up appointments reviewed: PCP Follow-up appointment confirmed?:  NA Specialist Hospital Follow-up appointment confirmed?: Yes Date of Specialist follow-up appointment?: 11/05/22 Follow-Up Specialty Provider:: ortho Do you need transportation to your follow-up appointment?: No Do you understand care options if your condition(s) worsen?: Yes-patient verbalized understanding    SIGNATURE Karena Addison, LPN [facility] Nurse Health Advisor Direct Dial [phone]

## 2022-10-12 NOTE — Telephone Encounter (Signed)
Patient called. Would like oxycodone HCL called in for him. How can he put deodorant on and can he?

## 2022-10-12 NOTE — Telephone Encounter (Signed)
Ok done erx 

## 2022-10-12 NOTE — Telephone Encounter (Signed)
Pt should contact the prescriber of his immediate release med

## 2022-10-12 NOTE — Telephone Encounter (Signed)
Patient still needs his muscle relaxers sent to pharmacy

## 2022-10-12 NOTE — Telephone Encounter (Signed)
 Called and advised, pt stated understanding

## 2022-10-12 NOTE — Telephone Encounter (Signed)
Patient wanted to know if he can get the extended release oxycodone instead of the immediate release he was just prescribed.   Prescription Request  10/12/2022  LOV: 07/21/2022  What is the name of the medication or equipment?  cyclobenzaprine (FLEXERIL) 5 MG tablet   Have you contacted your pharmacy to request a refill? No   Which pharmacy would you like this sent to?  [facility] on [address] in [place], [place]    Patient notified that their request is being sent to the clinical staff for review and that they should receive a response within 2 business days.   Please advise at Mobile [phone] (mobile)

## 2022-10-12 NOTE — Telephone Encounter (Signed)
Sent in. Okay to put deodorant on but has to have wife or son lift his arm in order to get access to his armpit to apply. He cannot lift the arm by himself

## 2022-10-18 ENCOUNTER — Encounter (HOSPITAL_COMMUNITY): Payer: Self-pay | Admitting: Orthopedic Surgery

## 2022-10-20 ENCOUNTER — Other Ambulatory Visit (INDEPENDENT_AMBULATORY_CARE_PROVIDER_SITE_OTHER): Payer: BC Managed Care – PPO

## 2022-10-20 ENCOUNTER — Ambulatory Visit: Payer: BC Managed Care – PPO | Admitting: Surgical

## 2022-10-20 DIAGNOSIS — S42252D Displaced fracture of greater tuberosity of left humerus, subsequent encounter for fracture with routine healing: Secondary | ICD-10-CM

## 2022-10-20 DIAGNOSIS — M25512 Pain in left shoulder: Secondary | ICD-10-CM

## 2022-10-20 MED ORDER — CELECOXIB 100 MG PO CAPS: 100.0000 mg | ORAL_CAPSULE | Freq: Two times a day (BID) | ORAL | 0 refills | Status: DC

## 2022-10-20 MED ORDER — OXYCODONE HCL 5 MG PO TABS: 5.0000 mg | ORAL_TABLET | Freq: Three times a day (TID) | ORAL | 0 refills | Status: DC | PRN

## 2022-10-21 ENCOUNTER — Telehealth: Payer: Self-pay | Admitting: Surgical

## 2022-10-21 MED ORDER — CELECOXIB 100 MG PO CAPS: 100.0000 mg | ORAL_CAPSULE | Freq: Two times a day (BID) | ORAL | 0 refills | Status: AC

## 2022-10-21 MED ORDER — OXYCODONE HCL 5 MG PO TABS: 5.0000 mg | ORAL_TABLET | Freq: Three times a day (TID) | ORAL | 0 refills | Status: DC | PRN

## 2022-10-21 NOTE — Telephone Encounter (Signed)
Patient called and said that his medication was sent to the wrong pharmacy. So the pharmacy on [address] has it ready but the main pharmacy is [address]. They refuse to give it to him because its not time for either medication to be filled. [phone]

## 2022-10-21 NOTE — Telephone Encounter (Signed)
Patient aware that I will work on this error  I called [facility] and let them know he [facility] issues She states [facility] will [facility] other [facility] and [facility] medications

## 2022-10-22 ENCOUNTER — Encounter: Payer: Self-pay | Admitting: Surgical

## 2022-10-22 NOTE — Progress Notes (Signed)
Post-Op Visit Note   Patient: Isaiah Gibson           Date of Birth: 19-May-1985           MRN: [id number] Visit Date: 10/20/2022 PCP: Corwin Levins, MD   Assessment & Plan:  Chief Complaint:  Chief Complaint  Patient presents with   Left Shoulder - Routine Post Op   Visit Diagnoses:  1. Closed displaced fracture of greater tuberosity of left humerus with routine healing, subsequent encounter   2. Acute pain of left shoulder     Plan: Patient is a 37 year old male who presents s/p left shoulder greater tuberosity fracture ORIF on 10/08/2022.  He is doing well overall and pain is better controlled compared with prior to surgery.  Weaning off oxycodone and now only takes it right before bed and when he gets up in the morning.  He has no fevers or chills.  Taking Celebrex as well.  No chest pain or trouble breathing.  He is using his sling and avoiding any lifting with the operative arm.  On exam, patient has 10 degrees external rotation, 70 degrees abduction, 70 degrees forward elevation passively.  Axillary nerve intact with deltoid firing.  Intact EPL, FPL, finger abduction, pronation/supination, bicep, tricep, deltoid.  Incision looks to be healing well without evidence of infection or dehiscence.  Plan is to refill pain medication today.  He will start physical therapy at [facility] to focus on only passive range of motion with external rotation up to 30 degrees, abduction and forward elevation to 90 degrees.  Absolutely no active range of motion or any strengthening exercises until he is cleared.  Follow-up in 2 weeks for clinical recheck with Dr. August Saucer.  Follow-Up Instructions: No follow-ups on file.   Orders:  Orders Placed This Encounter  Procedures   XR Shoulder Left   Ambulatory referral to Physical Therapy   Meds ordered this encounter  Medications   DISCONTD: oxyCODONE (ROXICODONE) 5 MG immediate release tablet    Sig: Take 1 tablet (5 mg total) by mouth every  8 (eight) hours as needed for severe pain.    Dispense:  30 tablet    Refill:  0   DISCONTD: celecoxib (CELEBREX) 100 MG capsule    Sig: Take 1 capsule (100 mg total) by mouth 2 (two) times daily.    Dispense:  60 capsule    Refill:  0   oxyCODONE (ROXICODONE) 5 MG immediate release tablet    Sig: Take 1 tablet (5 mg total) by mouth every 8 (eight) hours as needed for severe pain.    Dispense:  30 tablet    Refill:  0   celecoxib (CELEBREX) 100 MG capsule    Sig: Take 1 capsule (100 mg total) by mouth 2 (two) times daily.    Dispense:  60 capsule    Refill:  0    Imaging: No results found.  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   Hematospermia 09/11/2020   Fatigue 09/27/2019   Erectile dysfunction 09/27/2019   Upper respiratory tract infection 09/27/2019   Screening-pulmonary TB 04/23/2017   Anxiety 04/14/2016   Panic attacks 04/14/2016   Weight gain 11/26/2015   Former smoker 11/26/2015   Pectoralis muscle strain 04/22/2015   Tendinitis of  forearm 04/22/2015   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 04/16/2012   Dizziness 01/25/2011   Urinary frequency 01/25/2011   Bilateral leg edema 01/25/2011   Encounter for well adult exam with abnormal findings 01/02/2011   HEMATOCHEZIA 12/18/2009   WRIST PAIN, RIGHT 12/09/2009   ANKLE PAIN, RIGHT 12/09/2009   POLYCYTHEMIA 05/20/2009   RASH-NONVESICULAR 09/04/2008   ANKLE SPRAIN, LEFT 05/31/2008   Palpitations 01/25/2008   VENEREAL WART 05/15/2007   SMOKER 05/15/2007   BACK PAIN 05/15/2007   INSOMNIA-SLEEP DISORDER-UNSPEC 05/15/2007   Attention deficit disorder 10/20/2006   GERD 10/20/2006   PSORIASIS 10/20/2006   Past Medical History:  Diagnosis Date   ADD 10/20/2006   BACK PAIN 05/15/2007    GERD 10/20/2006   HEMATOCHEZIA 12/18/2009   INSOMNIA-SLEEP DISORDER-UNSPEC 05/15/2007   Palpitations 01/25/2008   POLYCYTHEMIA 05/20/2009   PSORIASIS 10/20/2006   SMOKER 05/15/2007   VENEREAL WART 05/15/2007    Family History  Problem Relation Age of Onset   Hypertension Other        Grandfather    Past Surgical History:  Procedure Laterality Date   HAND SURGERY Right    ORIF SHOULDER FRACTURE Left 10/08/2022   Procedure: OPEN REDUCTION INTERNAL FIXATION (ORIF) GREATER TUBEROSITY FRACTURE;  Surgeon: Cammy Copa, MD;  Location: [facility] OR;  Service: Orthopedics;  Laterality: Left;  HANDY BED   WISDOM TOOTH EXTRACTION     Social History   Occupational History   Not on file  Tobacco Use   Smoking status: Former    Current packs/day: 0.00    Types: Cigarettes    Quit date: 09/19/2014    Years since quitting: 8.0   Smokeless tobacco: Never  Vaping Use   Vaping status: Never Used  Substance and Sexual Activity   Alcohol use: Yes    Alcohol/week: 7.0 standard drinks of alcohol    Types: 7 Shots of liquor per week    Comment: daily ETOH hard liquor   Drug use: Yes    Types: Marijuana    Comment: smoke and gummies   Sexual activity: Not on file

## 2022-11-01 ENCOUNTER — Other Ambulatory Visit: Payer: Self-pay | Admitting: Internal Medicine

## 2022-11-01 ENCOUNTER — Other Ambulatory Visit: Payer: Self-pay | Admitting: Surgical

## 2022-11-01 MED ORDER — AMPHETAMINE-DEXTROAMPHETAMINE 30 MG PO TABS: 30.0000 mg | ORAL_TABLET | Freq: Two times a day (BID) | ORAL | 0 refills | Status: DC

## 2022-11-01 MED ORDER — OXYCODONE HCL 5 MG PO TABS: 5.0000 mg | ORAL_TABLET | Freq: Two times a day (BID) | ORAL | 0 refills | Status: DC | PRN

## 2022-11-03 ENCOUNTER — Ambulatory Visit (INDEPENDENT_AMBULATORY_CARE_PROVIDER_SITE_OTHER): Payer: BC Managed Care – PPO | Admitting: Surgical

## 2022-11-03 ENCOUNTER — Other Ambulatory Visit (INDEPENDENT_AMBULATORY_CARE_PROVIDER_SITE_OTHER): Payer: Self-pay

## 2022-11-03 ENCOUNTER — Other Ambulatory Visit (HOSPITAL_COMMUNITY): Payer: Self-pay

## 2022-11-03 DIAGNOSIS — S42252D Displaced fracture of greater tuberosity of left humerus, subsequent encounter for fracture with routine healing: Secondary | ICD-10-CM | POA: Diagnosis not present

## 2022-11-04 ENCOUNTER — Ambulatory Visit: Payer: BC Managed Care – PPO | Attending: Surgical

## 2022-11-04 ENCOUNTER — Other Ambulatory Visit: Payer: Self-pay

## 2022-11-04 DIAGNOSIS — M6281 Muscle weakness (generalized): Secondary | ICD-10-CM | POA: Diagnosis present

## 2022-11-04 DIAGNOSIS — M25612 Stiffness of left shoulder, not elsewhere classified: Secondary | ICD-10-CM | POA: Insufficient documentation

## 2022-11-04 DIAGNOSIS — M25512 Pain in left shoulder: Secondary | ICD-10-CM | POA: Diagnosis present

## 2022-11-04 DIAGNOSIS — Z8781 Personal history of (healed) traumatic fracture: Secondary | ICD-10-CM | POA: Insufficient documentation

## 2022-11-04 NOTE — Therapy (Signed)
OUTPATIENT PHYSICAL THERAPY SHOULDER EVALUATION   Patient Name: Isaiah Gibson MRN: [id number] DOB:01/11/1986, 37 y.o., male Today's Date: 11/04/2022  END OF SESSION:  PT End of Session - 11/04/22 1053     Visit Number 1    Date for PT Re-Evaluation 01/27/23    Progress Note Due on Visit 10    PT Start Time 1015    PT Stop Time 1053    PT Time Calculation (min) 38 min    Activity Tolerance Patient tolerated treatment well    Behavior During Therapy [facility] for tasks assessed/performed             Past Medical History:  Diagnosis Date   ADD 10/20/2006   BACK PAIN 05/15/2007   GERD 10/20/2006   HEMATOCHEZIA 12/18/2009   INSOMNIA-SLEEP DISORDER-UNSPEC 05/15/2007   Palpitations 01/25/2008   POLYCYTHEMIA 05/20/2009   PSORIASIS 10/20/2006   SMOKER 05/15/2007   VENEREAL WART 05/15/2007   Past Surgical History:  Procedure Laterality Date   HAND SURGERY Right    ORIF SHOULDER FRACTURE Left 10/08/2022   Procedure: OPEN REDUCTION INTERNAL FIXATION (ORIF) GREATER TUBEROSITY FRACTURE;  Surgeon: Cammy Copa, MD;  Location: [facility] OR;  Service: Orthopedics;  Laterality: Left;  HANDY BED   WISDOM TOOTH EXTRACTION     Patient Active Problem List   Diagnosis Date Noted   Closed fracture dislocation of shoulder with routine healing 10/09/2022   Obesity (BMI 35.0-39.9 without comorbidity) 07/22/2022   Vitamin D deficiency 07/22/2022   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   Hematospermia 09/11/2020   Fatigue 09/27/2019   Erectile dysfunction 09/27/2019   Upper respiratory tract infection 09/27/2019   Screening-pulmonary TB 04/23/2017   Anxiety 04/14/2016   Panic attacks 04/14/2016   Weight gain 11/26/2015   Former smoker 11/26/2015   Pectoralis muscle strain 04/22/2015   Tendinitis of forearm 04/22/2015   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 04/16/2012   Dizziness 01/25/2011   Urinary frequency 01/25/2011   Bilateral leg edema 01/25/2011   Encounter for well adult exam with  abnormal findings 01/02/2011   HEMATOCHEZIA 12/18/2009   WRIST PAIN, RIGHT 12/09/2009   ANKLE PAIN, RIGHT 12/09/2009   POLYCYTHEMIA 05/20/2009   RASH-NONVESICULAR 09/04/2008   ANKLE SPRAIN, LEFT 05/31/2008   Palpitations 01/25/2008   VENEREAL WART 05/15/2007   SMOKER 05/15/2007   BACK PAIN 05/15/2007   INSOMNIA-SLEEP DISORDER-UNSPEC 05/15/2007   Attention deficit disorder 10/20/2006   GERD 10/20/2006   PSORIASIS 10/20/2006    PCP: Oliver Barre, MD  REFERRING PROVIDER: Julieanne Cotton  REFERRING DIAG: L proximal humerus fx and dislocation, s/p ORIF  THERAPY DIAG:  History of left shoulder fracture  Stiffness of left shoulder, not elsewhere classified  Acute pain of left shoulder  Muscle weakness of left arm  Rationale for Evaluation and Treatment: Rehabilitation  ONSET DATE: 10/08/22  SUBJECTIVE:  SUBJECTIVE STATEMENT: Sleeping in recliner, unable to lie supine yet as weight of arm pulls down and really hurts in front. Back to work this week, no problems so far, I work from home so can use R hand on computer Hand dominance: Right  PERTINENT HISTORY: Open reduction and internal fixation of left proximal humerus fracture.   PAIN:  Are you having pain? Yes: NPRS scale: 3-6/10 Pain location: L ant shoulder Pain description: stiff, aching, worse in am, when wakes up Aggravating factors: lying down Relieving factors: oxycodone, muscle relaxer  PRECAUTIONS: Other:  as of 10/20/22 orthopedic notes: passive range of motion with external rotation up to 30 degrees, abduction and forward elevation to 90 degrees.  Absolutely no active range of motion or any strengthening exercises until he is cleared.  Follow-up in 2 weeks for clinical recheck with Dr. August Saucer.  RED FLAGS: None   WEIGHT BEARING  RESTRICTIONS: Yes NWB L UE  FALLS:  Has patient fallen in last 6 months? Yes. Number of falls 1  LIVING ENVIRONMENT: Lives with: lives with their family Lives in: House/apartment Stairs: Yes: Internal: 11 steps; on left going up and External: 3 steps; on right going up Has following equipment at home: None  OCCUPATION: Multimedia programmer, on computer  PLOF: Independent  PATIENT GOALS:to be able to participate in golfing, activities, with family, use L arm for dressing, bathing, I  NEXT MD VISIT: in 1 month  OBJECTIVE:   DIAGNOSTIC FINDINGS:  Images available in epic chart review. Has plate and 8 screws L prox humerus  PATIENT SURVEYS:  Quick Dash 79.5%  COGNITION: Overall cognitive status: Within functional limits for tasks assessed     SENSATION: WFL  POSTURE: Soft sling L shoulder. Rounding L shoulder  UPPER EXTREMITY ROM: PROM L shoulder flexion 70, abd 80, ER -18 R shoulder wfl UPPER EXTREMITY MMT: L UE MMT not performed due to recent injury , restrictions. Did note active contraction of all shoulder musculature and elbow, wrist, hand musculature  R hand and wrist deficits with grasp strength due to previous traumatic injury  SHOULDER SPECIAL TESTS: NA due to orthopedic restrictions   JOINT MOBILITY TESTING:  na  PALPATION:  Mild undulation L lateral upper arm mild edema L upper arm   TODAY'S TREATMENT:                                                                                                                                         DATE: 11/04/22:  Therapeutic exercise:  Initiated PROM per surgical precautions, PROM only Positioned pt in semireclined position on table with folded pillow under humerus for his comfort.  Performed 2 sets of 15 reps of gentle, slow ROM L shoulder into flex, abd, ER.  He tolerated well.   PATIENT EDUCATION: Education details: POC, goals Person educated: Patient and Spouse Education method: Explanation, Demonstration,  Tactile cues, and Verbal cues Education comprehension:  verbalized understanding and returned demonstration  HOME EXERCISE PROGRAM: TBD  ASSESSMENT:  CLINICAL IMPRESSION: Patient is a 37 y.o. male who was evaluated  today by skilled physical therapy  following a traumatic L proximal humerus fracture with shoulder dislocation , and subsequent ORIF. He is 3 weeks post injury and repair.  Initiated PROM today per orthopedist/s instructions.  He tolerated well.  Limited currently with PROM all directions, should be able to achieve 90 flex, 90 abd and 30 ER by the time her returns to MD in 1 month.  Should benefit from skilled PT to address his recovery of function L shoulder .   OBJECTIVE IMPAIRMENTS: decreased activity tolerance, decreased ROM, decreased strength, increased edema, increased fascial restrictions, impaired flexibility, and pain.   ACTIVITY LIMITATIONS: carrying, lifting, sleeping, bathing, toileting, dressing, and reach over head  PARTICIPATION LIMITATIONS: meal prep, cleaning, laundry, driving, shopping, community activity, and yard work  PERSONAL FACTORS: Fitness, Past/current experiences, Time since onset of injury/illness/exacerbation, Transportation, and 1-2 comorbidities: psoriasis, LBP  are also affecting patient's functional outcome.   REHAB POTENTIAL: Good  CLINICAL DECISION MAKING: Stable/uncomplicated  EVALUATION COMPLEXITY: Low   GOALS: Goals reviewed with patient? Yes  SHORT TERM GOALS: Target date: 4 weeks,    ROM L shoulder flex , abd 90, ER 30 Baseline:flex 70, abd 80, Er -18 Goal status: INITIAL   LONG TERM GOALS: Target date: 12 weeks: 01/27/23  I HEP Baseline: TBD Goal status: INITIAL  2.  L shoulder ROM for elevation 130 or greater for functional reach, ADL's Baseline:  Goal status: INITIAL  3.  L shoulder ER 60 or greater for functional reach, IR hand behind back to L 1 for dressing, bathing  Baseline:  Goal status: INITIAL  4.  Quick  DASH L shoulder 25% or less Baseline: 79.5% disability Goal status: INITIAL  5.  Strength L shoulder 4+/5 all planes for ability to play golf, perform yard work, lifting Baseline:  Goal status: INITIAL   PLAN:  PT FREQUENCY: 2x/week  PT DURATION: 12 weeks  PLANNED INTERVENTIONS: Therapeutic exercises, Therapeutic activity, Neuromuscular re-education, Balance training, Gait training, Patient/Family education, Self Care, and Joint mobilization  PLAN FOR NEXT SESSION: continue PROM until cleared by orthopedist to advance, pt to return in 3 1/2 weeks   Komal Stangelo L Airam Heidecker, PT, DPT, OCS 11/04/2022, 4:06 PM

## 2022-11-07 ENCOUNTER — Encounter: Payer: Self-pay | Admitting: Surgical

## 2022-11-07 NOTE — Progress Notes (Signed)
Post-Op Visit Note   Patient: Isaiah Gibson           Date of Birth: 1985-04-24           MRN: [id number] Visit Date: 11/03/2022 PCP: Corwin Levins, MD   Assessment & Plan:  Chief Complaint:  Chief Complaint  Patient presents with   Other     left shoulder greater tuberosity fracture ORIF on 10/08/2022   Visit Diagnoses:  1. Closed displaced fracture of greater tuberosity of left humerus with routine healing, subsequent encounter     Plan: Patient is a 37 year old male who presents s/p left shoulder greater tuberosity fracture ORIF on 10/08/2022.  He is about 4 weeks out.  In a sling.  Taking oxycodone about twice daily and occasional muscle relaxer mostly prior to sleeping.  He is doing well today but over the weekend he had some increased pain that required more consistent use of oxycodone for pain management.  Used a massage gun on his trapezius muscle which has helped with some of that pain.  Sleep is slowly but steadily improving but still will occasionally wake at night.  Has occasional spasms primarily in the lateral deltoid region.  Starts physical therapy tomorrow.  On exam, patient has intact EPL, FPL, finger abduction, bicep flexion, tricep extension.  Axillary nerve intact with deltoid firing.  Incisions healing well without evidence of infection or dehiscence.  Has decent early external rotation strength that this is somewhat difficult to assess with his lack of passive external rotation at this point.  He really has 0 degrees of external rotation, 65 degrees abduction, 75 degrees forward elevation passively.  Plan is to start physical therapy tomorrow.  He is okay for full passive range of motion and okay to start active assisted range of motion.  No full active range of motion or rotator cuff strengthening exercises until cleared at his next appointment.  Follow-up in 4 weeks for clinical recheck with Dr. August Saucer.  Follow-Up Instructions: No follow-ups on file.   Orders:   Orders Placed This Encounter  Procedures   XR Shoulder Left   No orders of the defined types were placed in this encounter.   Imaging: No results found.  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   Hematospermia 09/11/2020   Fatigue 09/27/2019   Erectile dysfunction 09/27/2019   Upper respiratory tract infection 09/27/2019   Screening-pulmonary TB 04/23/2017   Anxiety 04/14/2016   Panic attacks 04/14/2016   Weight gain 11/26/2015   Former smoker 11/26/2015   Pectoralis muscle strain 04/22/2015   Tendinitis of forearm 04/22/2015   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 04/16/2012   Dizziness 01/25/2011   Urinary frequency 01/25/2011   Bilateral leg edema 01/25/2011   Encounter for well adult exam with abnormal findings 01/02/2011   HEMATOCHEZIA 12/18/2009   WRIST PAIN, RIGHT 12/09/2009   ANKLE PAIN, RIGHT 12/09/2009   POLYCYTHEMIA 05/20/2009   RASH-NONVESICULAR 09/04/2008   ANKLE SPRAIN, LEFT 05/31/2008   Palpitations 01/25/2008   VENEREAL WART 05/15/2007   SMOKER 05/15/2007   BACK PAIN 05/15/2007   INSOMNIA-SLEEP DISORDER-UNSPEC 05/15/2007   Attention deficit disorder 10/20/2006   GERD 10/20/2006   PSORIASIS 10/20/2006   Past Medical History:  Diagnosis Date   ADD 10/20/2006   BACK PAIN 05/15/2007  GERD 10/20/2006   HEMATOCHEZIA 12/18/2009   INSOMNIA-SLEEP DISORDER-UNSPEC 05/15/2007   Palpitations 01/25/2008   POLYCYTHEMIA 05/20/2009   PSORIASIS 10/20/2006   SMOKER 05/15/2007   VENEREAL WART 05/15/2007    Family History  Problem Relation Age of Onset   Hypertension Other        Grandfather    Past Surgical History:  Procedure Laterality Date   HAND SURGERY Right    ORIF SHOULDER FRACTURE Left 10/08/2022   Procedure: OPEN  REDUCTION INTERNAL FIXATION (ORIF) GREATER TUBEROSITY FRACTURE;  Surgeon: Cammy Copa, MD;  Location: [facility] OR;  Service: Orthopedics;  Laterality: Left;  HANDY BED   WISDOM TOOTH EXTRACTION     Social History   Occupational History   Not on file  Tobacco Use   Smoking status: Former    Current packs/day: 0.00    Types: Cigarettes    Quit date: 09/19/2014    Years since quitting: 8.1   Smokeless tobacco: Never  Vaping Use   Vaping status: Never Used  Substance and Sexual Activity   Alcohol use: Yes    Alcohol/week: 7.0 standard drinks of alcohol    Types: 7 Shots of liquor per week    Comment: daily ETOH hard liquor   Drug use: Yes    Types: Marijuana    Comment: smoke and gummies   Sexual activity: Not on file

## 2022-11-08 ENCOUNTER — Other Ambulatory Visit (HOSPITAL_COMMUNITY): Payer: Self-pay

## 2022-11-09 ENCOUNTER — Ambulatory Visit: Payer: BC Managed Care – PPO

## 2022-11-09 DIAGNOSIS — Z8781 Personal history of (healed) traumatic fracture: Secondary | ICD-10-CM | POA: Diagnosis not present

## 2022-11-09 DIAGNOSIS — M25612 Stiffness of left shoulder, not elsewhere classified: Secondary | ICD-10-CM

## 2022-11-09 DIAGNOSIS — M6281 Muscle weakness (generalized): Secondary | ICD-10-CM

## 2022-11-09 DIAGNOSIS — M25512 Pain in left shoulder: Secondary | ICD-10-CM

## 2022-11-09 NOTE — Therapy (Signed)
OUTPATIENT PHYSICAL THERAPY SHOULDER TREATMENT   Patient Name: Isaiah Gibson MRN: [id number] DOB:April 02, 1985, 37 y.o., male Today's Date: 11/09/2022  END OF SESSION:  PT End of Session - 11/09/22 1144     Visit Number 2    Date for PT Re-Evaluation 01/27/23    Progress Note Due on Visit 10    PT Start Time 1103    PT Stop Time 1144    PT Time Calculation (min) 41 min    Activity Tolerance Patient tolerated treatment well    Behavior During Therapy [facility] for tasks assessed/performed              Past Medical History:  Diagnosis Date   ADD 10/20/2006   BACK PAIN 05/15/2007   GERD 10/20/2006   HEMATOCHEZIA 12/18/2009   INSOMNIA-SLEEP DISORDER-UNSPEC 05/15/2007   Palpitations 01/25/2008   POLYCYTHEMIA 05/20/2009   PSORIASIS 10/20/2006   SMOKER 05/15/2007   VENEREAL WART 05/15/2007   Past Surgical History:  Procedure Laterality Date   HAND SURGERY Right    ORIF SHOULDER FRACTURE Left 10/08/2022   Procedure: OPEN REDUCTION INTERNAL FIXATION (ORIF) GREATER TUBEROSITY FRACTURE;  Surgeon: Cammy Copa, MD;  Location: [facility] OR;  Service: Orthopedics;  Laterality: Left;  HANDY BED   WISDOM TOOTH EXTRACTION     Patient Active Problem List   Diagnosis Date Noted   Closed fracture dislocation of shoulder with routine healing 10/09/2022   Obesity (BMI 35.0-39.9 without comorbidity) 07/22/2022   Vitamin D deficiency 07/22/2022   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   Hematospermia 09/11/2020   Fatigue 09/27/2019   Erectile dysfunction 09/27/2019   Upper respiratory tract infection 09/27/2019   Screening-pulmonary TB 04/23/2017   Anxiety 04/14/2016   Panic attacks 04/14/2016   Weight gain 11/26/2015   Former smoker 11/26/2015   Pectoralis muscle strain 04/22/2015   Tendinitis of forearm 04/22/2015   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 04/16/2012   Dizziness 01/25/2011   Urinary frequency 01/25/2011   Bilateral leg edema 01/25/2011   Encounter for well adult exam with  abnormal findings 01/02/2011   HEMATOCHEZIA 12/18/2009   WRIST PAIN, RIGHT 12/09/2009   ANKLE PAIN, RIGHT 12/09/2009   POLYCYTHEMIA 05/20/2009   RASH-NONVESICULAR 09/04/2008   ANKLE SPRAIN, LEFT 05/31/2008   Palpitations 01/25/2008   VENEREAL WART 05/15/2007   SMOKER 05/15/2007   BACK PAIN 05/15/2007   INSOMNIA-SLEEP DISORDER-UNSPEC 05/15/2007   Attention deficit disorder 10/20/2006   GERD 10/20/2006   PSORIASIS 10/20/2006    PCP: Oliver Barre, MD  REFERRING PROVIDER: Julieanne Cotton  REFERRING DIAG: L proximal humerus fx and dislocation, s/p ORIF  THERAPY DIAG:  History of left shoulder fracture  Stiffness of left shoulder, not elsewhere classified  Acute pain of left shoulder  Muscle weakness of left arm  Rationale for Evaluation and Treatment: Rehabilitation  ONSET DATE: 10/08/22  SUBJECTIVE:  SUBJECTIVE STATEMENT: Sleeping in recliner, holding coffee cups in L hand but not moving his shoulder.  Hand dominance: Right  PERTINENT HISTORY: Open reduction and internal fixation of left proximal humerus fracture.   PAIN:  Are you having pain? Yes: NPRS scale: 2/10 Pain location: L ant shoulder Pain description: stiff, aching, worse in am, when wakes up Aggravating factors: lying down Relieving factors: oxycodone, muscle relaxer  PRECAUTIONS: Other:  as of 10/20/22 orthopedic notes: passive range of motion with external rotation up to 30 degrees, abduction and forward elevation to 90 degrees.  Absolutely no active range of motion or any strengthening exercises until he is cleared.  Follow-up in 2 weeks for clinical recheck with Dr. August Saucer.  RED FLAGS: None   WEIGHT BEARING RESTRICTIONS: Yes NWB L UE  FALLS:  Has patient fallen in last 6 months? Yes. Number of falls 1  LIVING  ENVIRONMENT: Lives with: lives with their family Lives in: House/apartment Stairs: Yes: Internal: 11 steps; on left going up and External: 3 steps; on right going up Has following equipment at home: None  OCCUPATION: Multimedia programmer, on computer  PLOF: Independent  PATIENT GOALS:to be able to participate in golfing, activities, with family, use L arm for dressing, bathing, I  NEXT MD VISIT: in 1 month  OBJECTIVE:   DIAGNOSTIC FINDINGS:  Images available in epic chart review. Has plate and 8 screws L prox humerus  PATIENT SURVEYS:  Quick Dash 79.5%  COGNITION: Overall cognitive status: Within functional limits for tasks assessed     SENSATION: WFL  POSTURE: Soft sling L shoulder. Rounding L shoulder  UPPER EXTREMITY ROM: PROM L shoulder flexion 70, abd 80, ER -18 R shoulder wfl UPPER EXTREMITY MMT: L UE MMT not performed due to recent injury , restrictions. Did note active contraction of all shoulder musculature and elbow, wrist, hand musculature  R hand and wrist deficits with grasp strength due to previous traumatic injury  SHOULDER SPECIAL TESTS: NA due to orthopedic restrictions   JOINT MOBILITY TESTING:  na  PALPATION:  Mild undulation L lateral upper arm mild edema L upper arm   TODAY'S TREATMENT:                                                                                                                                         DATE:  11/09/22 Therapeutic Exercise: to improve strength and mobility.  Demo, verbal and tactile cues throughout for technique.  PROM within MD precautions Putty squeezes for hand and wrist x 10  Seated L UT stretch 2x30 sec Seated L LS stretch 2x30 sec Seated thoracic extension 10x3" Seated table slides into flexion PROM x 10   11/04/22:  Therapeutic exercise:  Initiated PROM per surgical precautions, PROM only Positioned pt in semireclined position on table with folded pillow under humerus for his comfort.  Performed 2 sets  of 15 reps of gentle, slow ROM  L shoulder into flex, abd, ER.  He tolerated well.   PATIENT EDUCATION: Education details: HEP; reinforced precautions Person educated: Patient and Spouse Education method: Explanation, Demonstration, Tactile cues, and Verbal cues Education comprehension: verbalized understanding and returned demonstration  HOME EXERCISE PROGRAM: Access Code: [email or web address] URL: [email or web address] Date: 11/09/2022 Prepared by: Verta Ellen  Exercises - Seated Upper Trapezius Stretch  - 2 x daily - 7 x weekly - 2 sets - 2 reps - 30 sec hold - Gentle Levator Scapulae Stretch  - 2 x daily - 7 x weekly - 2 sets - 2 reps - 30 sec hold - Seated Thoracic Extension AROM  - 1 x daily - 7 x weekly - 2 sets - 10 reps - Seated Shoulder Flexion Towel Slide at Table Top  - 1 x daily - 7 x weekly - 2 sets - 10 reps  ASSESSMENT:  CLINICAL IMPRESSION: Patient showed a good response to treatment. Worked on neck flexibility and thoracic mobility d/t being in sling and PROM ROM phase of recovery. Also added PROM table slides for AA mvmt in L shoulder. Provided more education on precautions as well. Should benefit from skilled PT to address his recovery of function L shoulder .   OBJECTIVE IMPAIRMENTS: decreased activity tolerance, decreased ROM, decreased strength, increased edema, increased fascial restrictions, impaired flexibility, and pain.   ACTIVITY LIMITATIONS: carrying, lifting, sleeping, bathing, toileting, dressing, and reach over head  PARTICIPATION LIMITATIONS: meal prep, cleaning, laundry, driving, shopping, community activity, and yard work  PERSONAL FACTORS: Fitness, Past/current experiences, Time since onset of injury/illness/exacerbation, Transportation, and 1-2 comorbidities: psoriasis, LBP  are also affecting patient's functional outcome.   REHAB POTENTIAL: Good  CLINICAL DECISION MAKING: Stable/uncomplicated  EVALUATION COMPLEXITY:  Low   GOALS: Goals reviewed with patient? Yes  SHORT TERM GOALS: Target date: 4 weeks,    ROM L shoulder flex , abd 90, ER 30 Baseline:flex 70, abd 80, Er -18 Goal status: INITIAL   LONG TERM GOALS: Target date: 12 weeks: 01/27/23  I HEP Baseline: TBD Goal status: INITIAL  2.  L shoulder ROM for elevation 130 or greater for functional reach, ADL's Baseline:  Goal status: INITIAL  3.  L shoulder ER 60 or greater for functional reach, IR hand behind back to L 1 for dressing, bathing  Baseline:  Goal status: INITIAL  4.  Quick DASH L shoulder 25% or less Baseline: 79.5% disability Goal status: INITIAL  5.  Strength L shoulder 4+/5 all planes for ability to play golf, perform yard work, lifting Baseline:  Goal status: INITIAL   PLAN:  PT FREQUENCY: 2x/week  PT DURATION: 12 weeks  PLANNED INTERVENTIONS: Therapeutic exercises, Therapeutic activity, Neuromuscular re-education, Balance training, Gait training, Patient/Family education, Self Care, and Joint mobilization  PLAN FOR NEXT SESSION: continue PROM and AAROM until cleared by orthopedist to advance, pt to return in 3 1/2 weeks   Darleene Cleaver, PTA 11/09/2022, 11:44 AM

## 2022-11-11 ENCOUNTER — Ambulatory Visit: Payer: BC Managed Care – PPO

## 2022-11-11 ENCOUNTER — Other Ambulatory Visit (HOSPITAL_COMMUNITY): Payer: Self-pay

## 2022-11-11 DIAGNOSIS — M25512 Pain in left shoulder: Secondary | ICD-10-CM

## 2022-11-11 DIAGNOSIS — Z8781 Personal history of (healed) traumatic fracture: Secondary | ICD-10-CM

## 2022-11-11 DIAGNOSIS — M6281 Muscle weakness (generalized): Secondary | ICD-10-CM

## 2022-11-11 DIAGNOSIS — M25612 Stiffness of left shoulder, not elsewhere classified: Secondary | ICD-10-CM

## 2022-11-11 NOTE — Therapy (Signed)
OUTPATIENT PHYSICAL THERAPY SHOULDER TREATMENT   Patient Name: Isaiah Gibson MRN: [id number] DOB:07-May-1985, 37 y.o., male Today's Date: 11/11/2022  END OF SESSION:  PT End of Session - 11/11/22 1147     Visit Number 3    Date for PT Re-Evaluation 01/27/23    Progress Note Due on Visit 10    PT Start Time 1104    PT Stop Time 1156    PT Time Calculation (min) 52 min    Activity Tolerance Patient tolerated treatment well    Behavior During Therapy [facility] for tasks assessed/performed               Past Medical History:  Diagnosis Date   ADD 10/20/2006   BACK PAIN 05/15/2007   GERD 10/20/2006   HEMATOCHEZIA 12/18/2009   INSOMNIA-SLEEP DISORDER-UNSPEC 05/15/2007   Palpitations 01/25/2008   POLYCYTHEMIA 05/20/2009   PSORIASIS 10/20/2006   SMOKER 05/15/2007   VENEREAL WART 05/15/2007   Past Surgical History:  Procedure Laterality Date   HAND SURGERY Right    ORIF SHOULDER FRACTURE Left 10/08/2022   Procedure: OPEN REDUCTION INTERNAL FIXATION (ORIF) GREATER TUBEROSITY FRACTURE;  Surgeon: Cammy Copa, MD;  Location: [facility] OR;  Service: Orthopedics;  Laterality: Left;  HANDY BED   WISDOM TOOTH EXTRACTION     Patient Active Problem List   Diagnosis Date Noted   Closed fracture dislocation of shoulder with routine healing 10/09/2022   Obesity (BMI 35.0-39.9 without comorbidity) 07/22/2022   Vitamin D deficiency 07/22/2022   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   Hematospermia 09/11/2020   Fatigue 09/27/2019   Erectile dysfunction 09/27/2019   Upper respiratory tract infection 09/27/2019   Screening-pulmonary TB 04/23/2017   Anxiety 04/14/2016   Panic attacks 04/14/2016   Weight gain 11/26/2015   Former smoker 11/26/2015   Pectoralis muscle strain 04/22/2015   Tendinitis of forearm 04/22/2015   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 04/16/2012   Dizziness 01/25/2011   Urinary frequency 01/25/2011   Bilateral leg edema 01/25/2011   Encounter for well adult exam  with abnormal findings 01/02/2011   HEMATOCHEZIA 12/18/2009   WRIST PAIN, RIGHT 12/09/2009   ANKLE PAIN, RIGHT 12/09/2009   POLYCYTHEMIA 05/20/2009   RASH-NONVESICULAR 09/04/2008   ANKLE SPRAIN, LEFT 05/31/2008   Palpitations 01/25/2008   VENEREAL WART 05/15/2007   SMOKER 05/15/2007   BACK PAIN 05/15/2007   INSOMNIA-SLEEP DISORDER-UNSPEC 05/15/2007   Attention deficit disorder 10/20/2006   GERD 10/20/2006   PSORIASIS 10/20/2006    PCP: Oliver Barre, MD  REFERRING PROVIDER: Julieanne Cotton  REFERRING DIAG: L proximal humerus fx and dislocation, s/p ORIF  THERAPY DIAG:  History of left shoulder fracture  Stiffness of left shoulder, not elsewhere classified  Acute pain of left shoulder  Muscle weakness of left arm  Rationale for Evaluation and Treatment: Rehabilitation  ONSET DATE: 10/08/22  SUBJECTIVE:  SUBJECTIVE STATEMENT: Doing good with exercises but did not get a chance to try table slides at home. It was swollen yesterday but after using ice it went down.  Hand dominance: Right  PERTINENT HISTORY: Open reduction and internal fixation of left proximal humerus fracture.   PAIN:  Are you having pain? Yes: NPRS scale: 2/10 Pain location: L ant shoulder Pain description: stiff, aching, worse in am, when wakes up Aggravating factors: lying down Relieving factors: oxycodone, muscle relaxer  PRECAUTIONS: Other:  as of 10/20/22 orthopedic notes: passive range of motion with external rotation up to 30 degrees, abduction and forward elevation to 90 degrees.  Absolutely no active range of motion or any strengthening exercises until he is cleared.  Follow-up in 2 weeks for clinical recheck with Dr. August Saucer.  RED FLAGS: None   WEIGHT BEARING RESTRICTIONS: Yes NWB L UE  FALLS:  Has patient  fallen in last 6 months? Yes. Number of falls 1  LIVING ENVIRONMENT: Lives with: lives with their family Lives in: House/apartment Stairs: Yes: Internal: 11 steps; on left going up and External: 3 steps; on right going up Has following equipment at home: None  OCCUPATION: Multimedia programmer, on computer  PLOF: Independent  PATIENT GOALS:to be able to participate in golfing, activities, with family, use L arm for dressing, bathing, I  NEXT MD VISIT: in 1 month  OBJECTIVE:   DIAGNOSTIC FINDINGS:  Images available in epic chart review. Has plate and 8 screws L prox humerus  PATIENT SURVEYS:  Quick Dash 79.5%  COGNITION: Overall cognitive status: Within functional limits for tasks assessed     SENSATION: WFL  POSTURE: Soft sling L shoulder. Rounding L shoulder  UPPER EXTREMITY ROM: PROM L shoulder flexion 70, abd 80, ER -18 R shoulder wfl UPPER EXTREMITY MMT: L UE MMT not performed due to recent injury , restrictions. Did note active contraction of all shoulder musculature and elbow, wrist, hand musculature  R hand and wrist deficits with grasp strength due to previous traumatic injury  SHOULDER SPECIAL TESTS: NA due to orthopedic restrictions   JOINT MOBILITY TESTING:  na  PALPATION:  Mild undulation L lateral upper arm mild edema L upper arm   TODAY'S TREATMENT:                                                                                                                                         DATE: 11/11/22  Therapeutic Exercise: to improve strength and mobility.  Demo, verbal and tactile cues throughout for technique.  Table slides into flexion x 10  Table slides scaption x 10  Seated L UT 2x30 sec Seated L LS 2x30 sec Seated scap retraction 10x3" PROM to L shoulder flexion and scaption Manual Therapy: STM to L UT, LS, rhomboids  Vaso to L shoulder for edema 34 deg ;low compression x 10 min  11/09/22 Therapeutic Exercise: to improve strength and mobility.  Demo, verbal and tactile cues throughout for technique.  PROM within MD precautions Putty squeezes for hand and wrist x 10  Seated L UT stretch 2x30 sec Seated L LS stretch 2x30 sec Seated thoracic extension 10x3" Seated table slides into flexion PROM x 10   11/04/22:  Therapeutic exercise:  Initiated PROM per surgical precautions, PROM only Positioned pt in semireclined position on table with folded pillow under humerus for his comfort.  Performed 2 sets of 15 reps of gentle, slow ROM L shoulder into flex, abd, ER.  He tolerated well.   PATIENT EDUCATION: Education details: HEP; reinforced precautions Person educated: Patient and Spouse Education method: Explanation, Demonstration, Tactile cues, and Verbal cues Education comprehension: verbalized understanding and returned demonstration  HOME EXERCISE PROGRAM: Access Code: [email or web address] URL: [email or web address] Date: 11/11/2022 Prepared by: Verta Ellen  Exercises - Seated Upper Trapezius Stretch  - 2 x daily - 7 x weekly - 2 sets - 2 reps - 30 sec hold - Gentle Levator Scapulae Stretch  - 2 x daily - 7 x weekly - 2 sets - 2 reps - 30 sec hold - Seated Thoracic Extension AROM  - 1 x daily - 7 x weekly - 2 sets - 10 reps - Seated Shoulder Flexion Towel Slide at Table Top  - 1 x daily - 7 x weekly - 2 sets - 10 reps - Seated Shoulder Scaption Slide at Table Top with Forearm in Neutral  - 1 x daily - 7 x weekly - 2 sets - 10 reps - Seated Scapular Retraction  - 1 x daily - 7 x weekly - 2 sets - 10 reps  ASSESSMENT:  CLINICAL IMPRESSION: Patient showed a good response to treatment. Continued with interventions to reduce stiffness in the neck from use of sling. Also progressed with gentle PROM/AAROM to tolerance. He remains stiff with passive shoulder flexion to ~50 deg. Vaso post session for edema.   OBJECTIVE IMPAIRMENTS: decreased activity tolerance, decreased ROM, decreased strength, increased edema, increased  fascial restrictions, impaired flexibility, and pain.   ACTIVITY LIMITATIONS: carrying, lifting, sleeping, bathing, toileting, dressing, and reach over head  PARTICIPATION LIMITATIONS: meal prep, cleaning, laundry, driving, shopping, community activity, and yard work  PERSONAL FACTORS: Fitness, Past/current experiences, Time since onset of injury/illness/exacerbation, Transportation, and 1-2 comorbidities: psoriasis, LBP  are also affecting patient's functional outcome.   REHAB POTENTIAL: Good  CLINICAL DECISION MAKING: Stable/uncomplicated  EVALUATION COMPLEXITY: Low   GOALS: Goals reviewed with patient? Yes  SHORT TERM GOALS: Target date: 4 weeks,    ROM L shoulder flex , abd 90, ER 30 Baseline:flex 70, abd 80, Er -18 Goal status: INITIAL   LONG TERM GOALS: Target date: 12 weeks: 01/27/23  I HEP Baseline: TBD Goal status: INITIAL  2.  L shoulder ROM for elevation 130 or greater for functional reach, ADL's Baseline:  Goal status: INITIAL  3.  L shoulder ER 60 or greater for functional reach, IR hand behind back to L 1 for dressing, bathing  Baseline:  Goal status: INITIAL  4.  Quick DASH L shoulder 25% or less Baseline: 79.5% disability Goal status: INITIAL  5.  Strength L shoulder 4+/5 all planes for ability to play golf, perform yard work, lifting Baseline:  Goal status: INITIAL   PLAN:  PT FREQUENCY: 2x/week  PT DURATION: 12 weeks  PLANNED INTERVENTIONS: Therapeutic exercises, Therapeutic activity, Neuromuscular re-education, Balance training, Gait training, Patient/Family education, Self Care, and Joint mobilization  PLAN FOR NEXT SESSION: continue PROM and AAROM until cleared  by orthopedist to advance, pt to return in 3 1/2 weeks   Darleene Cleaver, PTA 11/11/2022, 11:47 AM

## 2022-11-12 NOTE — Therapy (Incomplete)
OUTPATIENT PHYSICAL THERAPY TREATMENT   Patient Name: Isaiah Gibson MRN: [id number] DOB:02-14-1986, 37 y.o., male Today's Date: 11/15/2022  END OF SESSION:  PT End of Session - 11/15/22 1105     Visit Number 4    Date for PT Re-Evaluation 01/27/23    Progress Note Due on Visit 10    PT Start Time 1105    PT Stop Time 1159    PT Time Calculation (min) 54 min    Activity Tolerance Patient tolerated treatment well    Behavior During Therapy [facility] for tasks assessed/performed                Past Medical History:  Diagnosis Date   ADD 10/20/2006   BACK PAIN 05/15/2007   GERD 10/20/2006   HEMATOCHEZIA 12/18/2009   INSOMNIA-SLEEP DISORDER-UNSPEC 05/15/2007   Palpitations 01/25/2008   POLYCYTHEMIA 05/20/2009   PSORIASIS 10/20/2006   SMOKER 05/15/2007   VENEREAL WART 05/15/2007   Past Surgical History:  Procedure Laterality Date   HAND SURGERY Right    ORIF SHOULDER FRACTURE Left 10/08/2022   Procedure: OPEN REDUCTION INTERNAL FIXATION (ORIF) GREATER TUBEROSITY FRACTURE;  Surgeon: Cammy Copa, MD;  Location: [facility] OR;  Service: Orthopedics;  Laterality: Left;  HANDY BED   WISDOM TOOTH EXTRACTION     Patient Active Problem List   Diagnosis Date Noted   Closed fracture dislocation of shoulder with routine healing 10/09/2022   Obesity (BMI 35.0-39.9 without comorbidity) 07/22/2022   Vitamin D deficiency 07/22/2022   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   Hematospermia 09/11/2020   Fatigue 09/27/2019   Erectile dysfunction 09/27/2019   Upper respiratory tract infection 09/27/2019   Screening-pulmonary TB 04/23/2017   Anxiety 04/14/2016   Panic attacks 04/14/2016   Weight gain 11/26/2015   Former smoker 11/26/2015   Pectoralis muscle strain 04/22/2015   Tendinitis of forearm 04/22/2015   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 04/16/2012   Dizziness 01/25/2011   Urinary frequency 01/25/2011   Bilateral leg edema 01/25/2011   Encounter for well adult exam with  abnormal findings 01/02/2011   HEMATOCHEZIA 12/18/2009   WRIST PAIN, RIGHT 12/09/2009   ANKLE PAIN, RIGHT 12/09/2009   POLYCYTHEMIA 05/20/2009   RASH-NONVESICULAR 09/04/2008   ANKLE SPRAIN, LEFT 05/31/2008   Palpitations 01/25/2008   VENEREAL WART 05/15/2007   SMOKER 05/15/2007   BACK PAIN 05/15/2007   INSOMNIA-SLEEP DISORDER-UNSPEC 05/15/2007   Attention deficit disorder 10/20/2006   GERD 10/20/2006   PSORIASIS 10/20/2006    PCP: Oliver Barre, MD  REFERRING PROVIDER: Julieanne Cotton, PA-C  REFERRING DIAG: L proximal humerus fx and dislocation, s/p ORIF  THERAPY DIAG:  History of left shoulder fracture  Stiffness of left shoulder, not elsewhere classified  Acute pain of left shoulder  Muscle weakness of left arm  Rationale for Evaluation and Treatment: Rehabilitation  ONSET DATE: 10/08/22  NEXT MD VISIT: 12/01/22   SUBJECTIVE:  SUBJECTIVE STATEMENT: Pt reports he thinks he slept wrong trying to sleep in the bed rather than the recliner an now his neck hurts which is making his shoulder hurt more.    Hand dominance: Right  PERTINENT HISTORY: Open reduction and internal fixation of left proximal humerus fracture.   PAIN:  Are you having pain? Yes: NPRS scale: 7-8/10 Pain location: L neck and shoulder Pain description: stiff, aching, worse in am, when wakes up Aggravating factors: lying down Relieving factors: oxycodone, muscle relaxer  PRECAUTIONS: Other: As of 11/03/22 ortho PA notes: He is okay for full passive range of motion and okay to start active assisted range of motion.  No full active range of motion or rotator cuff strengthening exercises until cleared at his next appointment.  Follow-up in 4 weeks for clinical recheck with Dr. August Saucer.  RED FLAGS: None   WEIGHT BEARING  RESTRICTIONS: Yes NWB L UE  FALLS:  Has patient fallen in last 6 months? Yes. Number of falls 1  LIVING ENVIRONMENT: Lives with: lives with their family Lives in: House/apartment Stairs: Yes: Internal: 11 steps; on left going up and External: 3 steps; on right going up Has following equipment at home: None  OCCUPATION: Multimedia programmer, on computer  PLOF: Independent  PATIENT GOALS:to be able to participate in golfing, activities, with family, use L arm for dressing, bathing, I   OBJECTIVE:   DIAGNOSTIC FINDINGS:  Images available in epic chart review. Has plate and 8 screws L prox humerus  PATIENT SURVEYS:  Quick Dash 79.5%  COGNITION: Overall cognitive status: Within functional limits for tasks assessed     SENSATION: WFL  POSTURE: Soft sling L shoulder. Rounding L shoulder  UPPER EXTREMITY ROM: PROM L shoulder flexion 70, abd 80, ER -18 R shoulder wfl  Passive ROM Left eval  Shoulder flexion 70  Shoulder extension   Shoulder abduction 80  Shoulder adduction   Shoulder internal rotation   Shoulder external rotation -18  Elbow flexion   Elbow extension   Wrist flexion   Wrist extension   Wrist ulnar deviation   Wrist radial deviation   Wrist pronation   Wrist supination    (Blank rows = not tested)  UPPER EXTREMITY MMT: L UE MMT not performed due to recent injury , restrictions. Did note active contraction of all shoulder musculature and elbow, wrist, hand musculature  R hand and wrist deficits with grasp strength due to previous traumatic injury  SHOULDER SPECIAL TESTS: NA due to orthopedic restrictions   JOINT MOBILITY TESTING:  na  PALPATION:  Mild undulation L lateral upper arm mild edema L upper arm    TODAY'S TREATMENT:                                                                                                                                         DATE:  11/15/22 THERAPEUTIC EXERCISE: to improve flexibility, strength  and mobility.   Demonstration, verbal and tactile cues throughout for technique. Pulleys: Flexion x 3 min B rhomboid (lower cervical/upper thoracic) stretch 3 x 30" - hands supported on Swiffer handle L UT stretch 3 x 30" L LS stretch 3 x 30" L shoulder flexion AAROM - holding onto Swiffer handle (as UE ranger) x 10 L shoulder scaption AAROM - holding onto Swiffer handle (as UE ranger) x 10 L shoulder pendulums - flexion/extension, horiz ABD/ADD, CW/CCW x 30" each   MANUAL THERAPY: To promote normalized muscle tension, improved flexibility, improved joint mobility, increased ROM, and reduced pain.  STM/DTM and manual TPR to L UT, LS, rhomboids, middle & lower traps, infraspinatus L shoulder gentle distraction +/- CW/CCW oscillations to promote muscle relaxation L shoulder PROM to tolerance in supine  MODALITIES: Game Ready vasopneumatic compression post session to L shoulder x 10 min, low compression, 34 to reduce post-exercise pain and swelling/edema   11/11/22  Therapeutic Exercise: to improve strength and mobility.  Demo, verbal and tactile cues throughout for technique.  Table slides into flexion x 10  Table slides scaption x 10  Seated L UT 2x30 sec Seated L LS 2x30 sec Seated scap retraction 10x3" PROM to L shoulder flexion and scaption Manual Therapy: STM to L UT, LS, rhomboids  Vaso to L shoulder for edema 34 deg ;low compression x 10 min   11/09/22 Therapeutic Exercise: to improve strength and mobility.  Demo, verbal and tactile cues throughout for technique.  PROM within MD precautions Putty squeezes for hand and wrist x 10  Seated L UT stretch 2x30 sec Seated L LS stretch 2x30 sec Seated thoracic extension 10x3" Seated table slides into flexion PROM x 10    PATIENT EDUCATION: Education details: HEP review and HEP modification - use of Swiffer as alternative for table slides Person educated: Patient and Spouse Education method: Explanation, Demonstration, Tactile cues, and  Verbal cues Education comprehension: verbalized understanding and returned demonstration  HOME EXERCISE PROGRAM: Access Code: [email or web address] URL: [email or web address] Date: 11/11/2022 Prepared by: Verta Ellen  Exercises - Seated Upper Trapezius Stretch  - 2 x daily - 7 x weekly - 2 sets - 2 reps - 30 sec hold - Gentle Levator Scapulae Stretch  - 2 x daily - 7 x weekly - 2 sets - 2 reps - 30 sec hold - Seated Thoracic Extension AROM  - 1 x daily - 7 x weekly - 2 sets - 10 reps - Seated Shoulder Flexion Towel Slide at Table Top  - 1 x daily - 7 x weekly - 2 sets - 10 reps - Seated Shoulder Scaption Slide at Table Top with Forearm in Neutral  - 1 x daily - 7 x weekly - 2 sets - 10 reps - Seated Scapular Retraction  - 1 x daily - 7 x weekly - 2 sets - 10 reps   ASSESSMENT:  CLINICAL IMPRESSION: Abe reports increased pain in his neck and L shoulder which he attributes to trying to sleep in the bed vs the recliner. Some relief noted with MT addressing muscle spasms and increased muscle tension followed by gentle stretching allowing for slightly improved P/AAROM tolerance. Able to increase L shoulder PROM in supine to ~90 flexion and scaption, but still unable to achieve neutral ER. He continues to struggle with table slides due to the height of his table at home, therefore provided alternative AAROM using Swiffer handle for support and will try wand AAROM next visit. Donzel will benefit from continued skilled PT  to address above deficits to improve mobility and activity tolerance with decreased pain interference.  OBJECTIVE IMPAIRMENTS: decreased activity tolerance, decreased ROM, decreased strength, increased edema, increased fascial restrictions, impaired flexibility, and pain.   ACTIVITY LIMITATIONS: carrying, lifting, sleeping, bathing, toileting, dressing, and reach over head  PARTICIPATION LIMITATIONS: meal prep, cleaning, laundry, driving, shopping, community activity, and yard  work  PERSONAL FACTORS: Fitness, Past/current experiences, Time since onset of injury/illness/exacerbation, Transportation, and 1-2 comorbidities: psoriasis, LBP  are also affecting patient's functional outcome.   REHAB POTENTIAL: Good  CLINICAL DECISION MAKING: Stable/uncomplicated  EVALUATION COMPLEXITY: Low   GOALS: Goals reviewed with patient? Yes  SHORT TERM GOALS: Target date: 4 weeks, 12/02/22  ROM L shoulder flex , abd 90, ER 30 Baseline:flex 70, abd 80, Er -18 Goal status: IN PROGRESS  LONG TERM GOALS: Target date: 12 weeks: 01/27/23  I HEP Baseline: TBD Goal status: IN PROGRESS  2.  L shoulder ROM for elevation 130 or greater for functional reach, ADL's Baseline:  Goal status: IN PROGRESS  3.  L shoulder ER 60 or greater for functional reach, IR hand behind back to L 1 for dressing, bathing  Baseline:  Goal status: IN PROGRESS  4.  Quick DASH L shoulder 25% or less Baseline: 79.5% disability Goal status: IN PROGRESS  5.  Strength L shoulder 4+/5 all planes for ability to play golf, perform yard work, lifting Baseline:  Goal status: IN PROGRESS   PLAN:  PT FREQUENCY: 2x/week  PT DURATION: 12 weeks  PLANNED INTERVENTIONS: Therapeutic exercises, Therapeutic activity, Neuromuscular re-education, Balance training, Gait training, Patient/Family education, Self Care, and Joint mobilization  PLAN FOR NEXT SESSION: continue PROM and AAROM until cleared by orthopedist to advance - try wand Fay Records, PT 11/15/2022, 1:09 PM

## 2022-11-15 ENCOUNTER — Ambulatory Visit: Payer: BC Managed Care – PPO | Admitting: Physical Therapy

## 2022-11-15 ENCOUNTER — Encounter: Payer: Self-pay | Admitting: Physical Therapy

## 2022-11-15 DIAGNOSIS — M25612 Stiffness of left shoulder, not elsewhere classified: Secondary | ICD-10-CM

## 2022-11-15 DIAGNOSIS — M25512 Pain in left shoulder: Secondary | ICD-10-CM

## 2022-11-15 DIAGNOSIS — Z8781 Personal history of (healed) traumatic fracture: Secondary | ICD-10-CM

## 2022-11-15 DIAGNOSIS — M6281 Muscle weakness (generalized): Secondary | ICD-10-CM

## 2022-11-22 ENCOUNTER — Ambulatory Visit: Payer: BC Managed Care – PPO

## 2022-11-22 DIAGNOSIS — Z8781 Personal history of (healed) traumatic fracture: Secondary | ICD-10-CM

## 2022-11-22 DIAGNOSIS — M25512 Pain in left shoulder: Secondary | ICD-10-CM

## 2022-11-22 DIAGNOSIS — M25612 Stiffness of left shoulder, not elsewhere classified: Secondary | ICD-10-CM

## 2022-11-22 DIAGNOSIS — M6281 Muscle weakness (generalized): Secondary | ICD-10-CM

## 2022-11-22 NOTE — Therapy (Signed)
OUTPATIENT PHYSICAL THERAPY TREATMENT   Patient Name: Isaiah Gibson MRN: [id number] DOB:10-Dec-1985, 37 y.o., male Today's Date: 11/22/2022  END OF SESSION:  PT End of Session - 11/22/22 1141     Visit Number 5    Date for PT Re-Evaluation 01/27/23    Progress Note Due on Visit 10    PT Start Time 1103    PT Stop Time 1154    PT Time Calculation (min) 51 min    Activity Tolerance Patient tolerated treatment well    Behavior During Therapy [facility] for tasks assessed/performed                 Past Medical History:  Diagnosis Date   ADD 10/20/2006   BACK PAIN 05/15/2007   GERD 10/20/2006   HEMATOCHEZIA 12/18/2009   INSOMNIA-SLEEP DISORDER-UNSPEC 05/15/2007   Palpitations 01/25/2008   POLYCYTHEMIA 05/20/2009   PSORIASIS 10/20/2006   SMOKER 05/15/2007   VENEREAL WART 05/15/2007   Past Surgical History:  Procedure Laterality Date   HAND SURGERY Right    ORIF SHOULDER FRACTURE Left 10/08/2022   Procedure: OPEN REDUCTION INTERNAL FIXATION (ORIF) GREATER TUBEROSITY FRACTURE;  Surgeon: Cammy Copa, MD;  Location: [facility] OR;  Service: Orthopedics;  Laterality: Left;  HANDY BED   WISDOM TOOTH EXTRACTION     Patient Active Problem List   Diagnosis Date Noted   Closed fracture dislocation of shoulder with routine healing 10/09/2022   Obesity (BMI 35.0-39.9 without comorbidity) 07/22/2022   Vitamin D deficiency 07/22/2022   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   Hematospermia 09/11/2020   Fatigue 09/27/2019   Erectile dysfunction 09/27/2019   Upper respiratory tract infection 09/27/2019   Screening-pulmonary TB 04/23/2017   Anxiety 04/14/2016   Panic attacks 04/14/2016   Weight gain 11/26/2015   Former smoker 11/26/2015   Pectoralis muscle strain 04/22/2015   Tendinitis of forearm 04/22/2015   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 04/16/2012   Dizziness 01/25/2011   Urinary frequency 01/25/2011   Bilateral leg edema 01/25/2011   Encounter for well adult exam with  abnormal findings 01/02/2011   HEMATOCHEZIA 12/18/2009   WRIST PAIN, RIGHT 12/09/2009   ANKLE PAIN, RIGHT 12/09/2009   POLYCYTHEMIA 05/20/2009   RASH-NONVESICULAR 09/04/2008   ANKLE SPRAIN, LEFT 05/31/2008   Palpitations 01/25/2008   VENEREAL WART 05/15/2007   SMOKER 05/15/2007   BACK PAIN 05/15/2007   INSOMNIA-SLEEP DISORDER-UNSPEC 05/15/2007   Attention deficit disorder 10/20/2006   GERD 10/20/2006   PSORIASIS 10/20/2006    PCP: Oliver Barre, MD  REFERRING PROVIDER: Julieanne Cotton, PA-C  REFERRING DIAG: L proximal humerus fx and dislocation, s/p ORIF  THERAPY DIAG:  History of left shoulder fracture  Stiffness of left shoulder, not elsewhere classified  Acute pain of left shoulder  Muscle weakness of left arm  Rationale for Evaluation and Treatment: Rehabilitation  ONSET DATE: 10/08/22  NEXT MD VISIT: 12/01/22   SUBJECTIVE:  SUBJECTIVE STATEMENT: Pt reports he is still unable to lie flat d/t shoulder pain. His incision still feels very tight.     Hand dominance: Right  PERTINENT HISTORY: Open reduction and internal fixation of left proximal humerus fracture.   PAIN:  Are you having pain? Yes: NPRS scale: 3 /10 Pain location: L neck and shoulder into scapula  Pain description: stiff, aching, worse in am, when wakes up Aggravating factors: lying down Relieving factors: oxycodone, muscle relaxer  PRECAUTIONS: Other: As of 11/03/22 ortho PA notes: He is okay for full passive range of motion and okay to start active assisted range of motion.  No full active range of motion or rotator cuff strengthening exercises until cleared at his next appointment.  Follow-up in 4 weeks for clinical recheck with Dr. August Saucer.  RED FLAGS: None   WEIGHT BEARING RESTRICTIONS: Yes NWB L UE  FALLS:  Has  patient fallen in last 6 months? Yes. Number of falls 1  LIVING ENVIRONMENT: Lives with: lives with their family Lives in: House/apartment Stairs: Yes: Internal: 11 steps; on left going up and External: 3 steps; on right going up Has following equipment at home: None  OCCUPATION: Multimedia programmer, on computer  PLOF: Independent  PATIENT GOALS:to be able to participate in golfing, activities, with family, use L arm for dressing, bathing, I   OBJECTIVE:   DIAGNOSTIC FINDINGS:  Images available in epic chart review. Has plate and 8 screws L prox humerus  PATIENT SURVEYS:  Quick Dash 79.5%  COGNITION: Overall cognitive status: Within functional limits for tasks assessed     SENSATION: WFL  POSTURE: Soft sling L shoulder. Rounding L shoulder  UPPER EXTREMITY ROM: PROM L shoulder flexion 70, abd 80, ER -18 R shoulder wfl  Passive ROM Left eval  Shoulder flexion 70  Shoulder extension   Shoulder abduction 80  Shoulder adduction   Shoulder internal rotation   Shoulder external rotation -18  Elbow flexion   Elbow extension   Wrist flexion   Wrist extension   Wrist ulnar deviation   Wrist radial deviation   Wrist pronation   Wrist supination    (Blank rows = not tested)  UPPER EXTREMITY MMT: L UE MMT not performed due to recent injury , restrictions. Did note active contraction of all shoulder musculature and elbow, wrist, hand musculature  R hand and wrist deficits with grasp strength due to previous traumatic injury  SHOULDER SPECIAL TESTS: NA due to orthopedic restrictions   JOINT MOBILITY TESTING:  na  PALPATION:  Mild undulation L lateral upper arm mild edema L upper arm    TODAY'S TREATMENT:                                                                                                                                         DATE: 11/22/22 MANUAL THERAPY: To promote normalized muscle tension, improved flexibility, improved joint mobility, increased  ROM,  and reduced pain.  STM/DTM and manual TPR to L UT, LS, rhomboids, middle & lower traps, lats  THERAPEUTIC EXERCISE: to improve flexibility, strength and mobility.  Demonstration, verbal and tactile cues throughout for technique. Pulleys: Flexion x 3 min Seated L AA flexion with swiffer x 10 Seated L AA scaption with swiffer x 10 Seated L AA circles with swiffer x 10  PROM to L shoulder MODALITIES: Game Ready vasopneumatic compression post session to L shoulder x 10 min, low compression, 34 to reduce post-exercise pain and swelling/edema  11/15/22 THERAPEUTIC EXERCISE: to improve flexibility, strength and mobility.  Demonstration, verbal and tactile cues throughout for technique. Pulleys: Flexion x 3 min B rhomboid (lower cervical/upper thoracic) stretch 3 x 30" - hands supported on Swiffer handle L UT stretch 3 x 30" L LS stretch 3 x 30" L shoulder flexion AAROM - holding onto Swiffer handle (as UE ranger) x 10 L shoulder scaption AAROM - holding onto Swiffer handle (as UE ranger) x 10 L shoulder pendulums - flexion/extension, horiz ABD/ADD, CW/CCW x 30" each   MANUAL THERAPY: To promote normalized muscle tension, improved flexibility, improved joint mobility, increased ROM, and reduced pain.  STM/DTM and manual TPR to L UT, LS, rhomboids, middle & lower traps, infraspinatus L shoulder gentle distraction +/- CW/CCW oscillations to promote muscle relaxation L shoulder PROM to tolerance in supine  MODALITIES: Game Ready vasopneumatic compression post session to L shoulder x 10 min, low compression, 34 to reduce post-exercise pain and swelling/edema   11/11/22  Therapeutic Exercise: to improve strength and mobility.  Demo, verbal and tactile cues throughout for technique.  Table slides into flexion x 10  Table slides scaption x 10  Seated L UT 2x30 sec Seated L LS 2x30 sec Seated scap retraction 10x3" PROM to L shoulder flexion and scaption Manual Therapy: STM to L UT, LS,  rhomboids  Vaso to L shoulder for edema 34 deg ;low compression x 10 min   11/09/22 Therapeutic Exercise: to improve strength and mobility.  Demo, verbal and tactile cues throughout for technique.  PROM within MD precautions Putty squeezes for hand and wrist x 10  Seated L UT stretch 2x30 sec Seated L LS stretch 2x30 sec Seated thoracic extension 10x3" Seated table slides into flexion PROM x 10    PATIENT EDUCATION: Education details: HEP review and HEP modification - use of Swiffer as alternative for table slides Person educated: Patient and Spouse Education method: Explanation, Demonstration, Tactile cues, and Verbal cues Education comprehension: verbalized understanding and returned demonstration  HOME EXERCISE PROGRAM: Access Code: [email or web address] URL: [email or web address] Date: 11/11/2022 Prepared by: Verta Ellen  Exercises - Seated Upper Trapezius Stretch  - 2 x daily - 7 x weekly - 2 sets - 2 reps - 30 sec hold - Gentle Levator Scapulae Stretch  - 2 x daily - 7 x weekly - 2 sets - 2 reps - 30 sec hold - Seated Thoracic Extension AROM  - 1 x daily - 7 x weekly - 2 sets - 10 reps - Seated Shoulder Flexion Towel Slide at Table Top  - 1 x daily - 7 x weekly - 2 sets - 10 reps - Seated Shoulder Scaption Slide at Table Top with Forearm in Neutral  - 1 x daily - 7 x weekly - 2 sets - 10 reps - Seated Scapular Retraction  - 1 x daily - 7 x weekly - 2 sets - 10 reps   ASSESSMENT:  CLINICAL IMPRESSION: Tywan continues to  be limited with shoulder PROM d/t muscle tension and spasm, needing cues to relax. Focus on skilled interventions are AAROM/PROM to restore functional ROM. He continues to have issues with sleeping in bed d/t L shoulder pain. GR post session to address pain and soreness. Samad will benefit from continued skilled PT to address above deficits to improve mobility and activity tolerance with decreased pain interference.  OBJECTIVE IMPAIRMENTS: decreased  activity tolerance, decreased ROM, decreased strength, increased edema, increased fascial restrictions, impaired flexibility, and pain.   ACTIVITY LIMITATIONS: carrying, lifting, sleeping, bathing, toileting, dressing, and reach over head  PARTICIPATION LIMITATIONS: meal prep, cleaning, laundry, driving, shopping, community activity, and yard work  PERSONAL FACTORS: Fitness, Past/current experiences, Time since onset of injury/illness/exacerbation, Transportation, and 1-2 comorbidities: psoriasis, LBP  are also affecting patient's functional outcome.   REHAB POTENTIAL: Good  CLINICAL DECISION MAKING: Stable/uncomplicated  EVALUATION COMPLEXITY: Low   GOALS: Goals reviewed with patient? Yes  SHORT TERM GOALS: Target date: 4 weeks, 12/02/22  ROM L shoulder flex , abd 90, ER 30 Baseline:flex 70, abd 80, Er -18 Goal status: IN PROGRESS  LONG TERM GOALS: Target date: 12 weeks: 01/27/23  I HEP Baseline: TBD Goal status: Met for current HEP- 11/22/22  2.  L shoulder ROM for elevation 130 or greater for functional reach, ADL's Baseline:  Goal status: IN PROGRESS  3.  L shoulder ER 60 or greater for functional reach, IR hand behind back to L 1 for dressing, bathing  Baseline:  Goal status: IN PROGRESS  4.  Quick DASH L shoulder 25% or less Baseline: 79.5% disability Goal status: IN PROGRESS  5.  Strength L shoulder 4+/5 all planes for ability to play golf, perform yard work, lifting Baseline:  Goal status: IN PROGRESS   PLAN:  PT FREQUENCY: 2x/week  PT DURATION: 12 weeks  PLANNED INTERVENTIONS: Therapeutic exercises, Therapeutic activity, Neuromuscular re-education, Balance training, Gait training, Patient/Family education, Self Care, and Joint mobilization  PLAN FOR NEXT SESSION: continue PROM and AAROM until cleared by orthopedist to advance - try wand Arsenio Loader, PTA 11/22/2022, 12:00 PM

## 2022-11-24 ENCOUNTER — Other Ambulatory Visit: Payer: Self-pay | Admitting: Surgical

## 2022-11-24 MED ORDER — OXYCODONE HCL 5 MG PO TABS: 5.0000 mg | ORAL_TABLET | Freq: Two times a day (BID) | ORAL | 0 refills | Status: DC | PRN

## 2022-11-25 ENCOUNTER — Other Ambulatory Visit: Payer: Self-pay

## 2022-11-25 ENCOUNTER — Ambulatory Visit: Payer: BC Managed Care – PPO | Attending: Surgical

## 2022-11-25 DIAGNOSIS — M25612 Stiffness of left shoulder, not elsewhere classified: Secondary | ICD-10-CM | POA: Insufficient documentation

## 2022-11-25 DIAGNOSIS — M6281 Muscle weakness (generalized): Secondary | ICD-10-CM | POA: Insufficient documentation

## 2022-11-25 DIAGNOSIS — Z8781 Personal history of (healed) traumatic fracture: Secondary | ICD-10-CM | POA: Diagnosis present

## 2022-11-25 DIAGNOSIS — M25512 Pain in left shoulder: Secondary | ICD-10-CM | POA: Insufficient documentation

## 2022-11-25 NOTE — Therapy (Signed)
OUTPATIENT PHYSICAL THERAPY TREATMENT   Patient Name: Isaiah Gibson MRN: [id number] DOB:September 27, 1985, 37 y.o., male Today's Date: 11/25/2022  END OF SESSION:  PT End of Session - 11/25/22 1024     Visit Number 6    Date for PT Re-Evaluation 01/27/23    Progress Note Due on Visit 10    PT Start Time 1022    PT Stop Time 1106    PT Time Calculation (min) 44 min    Activity Tolerance Patient tolerated treatment well    Behavior During Therapy [facility] for tasks assessed/performed                  Past Medical History:  Diagnosis Date   ADD 10/20/2006   BACK PAIN 05/15/2007   GERD 10/20/2006   HEMATOCHEZIA 12/18/2009   INSOMNIA-SLEEP DISORDER-UNSPEC 05/15/2007   Palpitations 01/25/2008   POLYCYTHEMIA 05/20/2009   PSORIASIS 10/20/2006   SMOKER 05/15/2007   VENEREAL WART 05/15/2007   Past Surgical History:  Procedure Laterality Date   HAND SURGERY Right    ORIF SHOULDER FRACTURE Left 10/08/2022   Procedure: OPEN REDUCTION INTERNAL FIXATION (ORIF) GREATER TUBEROSITY FRACTURE;  Surgeon: Cammy Copa, MD;  Location: [facility] OR;  Service: Orthopedics;  Laterality: Left;  HANDY BED   WISDOM TOOTH EXTRACTION     Patient Active Problem List   Diagnosis Date Noted   Closed fracture dislocation of shoulder with routine healing 10/09/2022   Obesity (BMI 35.0-39.9 without comorbidity) 07/22/2022   Vitamin D deficiency 07/22/2022   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   Hematospermia 09/11/2020   Fatigue 09/27/2019   Erectile dysfunction 09/27/2019   Upper respiratory tract infection 09/27/2019   Screening-pulmonary TB 04/23/2017   Anxiety 04/14/2016   Panic attacks 04/14/2016   Weight gain 11/26/2015   Former smoker 11/26/2015   Pectoralis muscle strain 04/22/2015   Tendinitis of forearm 04/22/2015   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 04/16/2012   Dizziness 01/25/2011   Urinary frequency 01/25/2011   Bilateral leg edema 01/25/2011   Encounter for well adult exam with  abnormal findings 01/02/2011   HEMATOCHEZIA 12/18/2009   WRIST PAIN, RIGHT 12/09/2009   ANKLE PAIN, RIGHT 12/09/2009   POLYCYTHEMIA 05/20/2009   RASH-NONVESICULAR 09/04/2008   ANKLE SPRAIN, LEFT 05/31/2008   Palpitations 01/25/2008   VENEREAL WART 05/15/2007   SMOKER 05/15/2007   BACK PAIN 05/15/2007   INSOMNIA-SLEEP DISORDER-UNSPEC 05/15/2007   Attention deficit disorder 10/20/2006   GERD 10/20/2006   PSORIASIS 10/20/2006    PCP: Oliver Barre, MD  REFERRING PROVIDER: Julieanne Cotton, PA-C  REFERRING DIAG: L proximal humerus fx and dislocation, s/p ORIF  THERAPY DIAG:  History of left shoulder fracture  Stiffness of left shoulder, not elsewhere classified  Acute pain of left shoulder  Muscle weakness of left arm  Rationale for Evaluation and Treatment: Rehabilitation  ONSET DATE: 10/08/22  NEXT MD VISIT: 12/01/22   SUBJECTIVE:  SUBJECTIVE STATEMENT: Pt reports he is still unable to lie flat d/t shoulder pain. His incision still feels very tight.     Hand dominance: Right  PERTINENT HISTORY: Open reduction and internal fixation of left proximal humerus fracture.   PAIN:  Are you having pain? Yes: NPRS scale: 3 /10 Pain location: L neck and shoulder into scapula  Pain description: stiff, aching, worse in am, when wakes up Aggravating factors: lying down Relieving factors: oxycodone, muscle relaxer  PRECAUTIONS: Other: As of 11/03/22 ortho PA notes: He is okay for full passive range of motion and okay to start active assisted range of motion.  No full active range of motion or rotator cuff strengthening exercises until cleared at his next appointment.  Follow-up in 4 weeks for clinical recheck with Dr. August Saucer.  RED FLAGS: None   WEIGHT BEARING RESTRICTIONS: Yes NWB L UE  FALLS:  Has  patient fallen in last 6 months? Yes. Number of falls 1  LIVING ENVIRONMENT: Lives with: lives with their family Lives in: House/apartment Stairs: Yes: Internal: 11 steps; on left going up and External: 3 steps; on right going up Has following equipment at home: None  OCCUPATION: Multimedia programmer, on computer  PLOF: Independent  PATIENT GOALS:to be able to participate in golfing, activities, with family, use L arm for dressing, bathing, I   OBJECTIVE:   DIAGNOSTIC FINDINGS:  Images available in epic chart review. Has plate and 8 screws L prox humerus  PATIENT SURVEYS:  Quick Dash 79.5%  COGNITION: Overall cognitive status: Within functional limits for tasks assessed     SENSATION: WFL  POSTURE: Soft sling L shoulder. Rounding L shoulder  UPPER EXTREMITY ROM: PROM L shoulder flexion 70, abd 80, ER -18 R shoulder wfl  Passive ROM Left eval  Shoulder flexion 70  Shoulder extension   Shoulder abduction 80  Shoulder adduction   Shoulder internal rotation   Shoulder external rotation -18  Elbow flexion   Elbow extension   Wrist flexion   Wrist extension   Wrist ulnar deviation   Wrist radial deviation   Wrist pronation   Wrist supination    (Blank rows = not tested)  UPPER EXTREMITY MMT: L UE MMT not performed due to recent injury , restrictions. Did note active contraction of all shoulder musculature and elbow, wrist, hand musculature  R hand and wrist deficits with grasp strength due to previous traumatic injury  SHOULDER SPECIAL TESTS: NA due to orthopedic restrictions   JOINT MOBILITY TESTING:  na  PALPATION:  Mild undulation L lateral upper arm mild edema L upper arm    TODAY'S TREATMENT:                                                                                                                                         DATE: 11/25/22 Therapeutic exercise: to improve flexibility L shoulder Supine PROM/ AAROM L shoulder mass  practice  Added supine  cane ER 5 sec holds, 10 x Added supine cane scapular punches therapist with light support L arm to maintain vertical position Supine cane flexion assisted from 90 to 100, 5 reps R side lying for scapular clocks, therapist providing tactile cues with hands Standing table slides with basketball on counter flex, scaption 10x Kinesiotape L shoulder, 2- I pieces , one from mid delts to middle traps, one from lateral delts to L upper traps, 35% pull  Vasopnuematic: game ready 10 min at end of session L shoulder in sitting.  11/22/22 MANUAL THERAPY: To promote normalized muscle tension, improved flexibility, improved joint mobility, increased ROM, and reduced pain.  STM/DTM and manual TPR to L UT, LS, rhomboids, middle & lower traps, lats  THERAPEUTIC EXERCISE: to improve flexibility, strength and mobility.  Demonstration, verbal and tactile cues throughout for technique. Pulleys: Flexion x 3 min Seated L AA flexion with swiffer x 10 Seated L AA scaption with swiffer x 10 Seated L AA circles with swiffer x 10  PROM to L shoulder MODALITIES: Game Ready vasopneumatic compression post session to L shoulder x 10 min, low compression, 34 to reduce post-exercise pain and swelling/edema  11/15/22 THERAPEUTIC EXERCISE: to improve flexibility, strength and mobility.  Demonstration, verbal and tactile cues throughout for technique. Pulleys: Flexion x 3 min B rhomboid (lower cervical/upper thoracic) stretch 3 x 30" - hands supported on Swiffer handle L UT stretch 3 x 30" L LS stretch 3 x 30" L shoulder flexion AAROM - holding onto Swiffer handle (as UE ranger) x 10 L shoulder scaption AAROM - holding onto Swiffer handle (as UE ranger) x 10 L shoulder pendulums - flexion/extension, horiz ABD/ADD, CW/CCW x 30" each   MANUAL THERAPY: To promote normalized muscle tension, improved flexibility, improved joint mobility, increased ROM, and reduced pain.  STM/DTM and manual TPR to L UT, LS, rhomboids, middle &  lower traps, infraspinatus L shoulder gentle distraction +/- CW/CCW oscillations to promote muscle relaxation L shoulder PROM to tolerance in supine  MODALITIES: Game Ready vasopneumatic compression post session to L shoulder x 10 min, low compression, 34 to reduce post-exercise pain and swelling/edema   11/11/22  Therapeutic Exercise: to improve strength and mobility.  Demo, verbal and tactile cues throughout for technique.  Table slides into flexion x 10  Table slides scaption x 10  Seated L UT 2x30 sec Seated L LS 2x30 sec Seated scap retraction 10x3" PROM to L shoulder flexion and scaption Manual Therapy: STM to L UT, LS, rhomboids  Vaso to L shoulder for edema 34 deg ;low compression x 10 min   11/09/22 Therapeutic Exercise: to improve strength and mobility.  Demo, verbal and tactile cues throughout for technique.  PROM within MD precautions Putty squeezes for hand and wrist x 10  Seated L UT stretch 2x30 sec Seated L LS stretch 2x30 sec Seated thoracic extension 10x3" Seated table slides into flexion PROM x 10    PATIENT EDUCATION: Education details: HEP review and HEP modification - use of Swiffer as alternative for table slides Person educated: Patient and Spouse Education method: Explanation, Demonstration, Tactile cues, and Verbal cues Education comprehension: verbalized understanding and returned demonstration  HOME EXERCISE PROGRAM: Access Code: [email or web address] URL: [email or web address] Date: 11/11/2022 Prepared by: Verta Ellen  Exercises - Seated Upper Trapezius Stretch  - 2 x daily - 7 x weekly - 2 sets - 2 reps - 30 sec hold - Gentle Levator Scapulae Stretch  - 2 x daily - 7  x weekly - 2 sets - 2 reps - 30 sec hold - Seated Thoracic Extension AROM  - 1 x daily - 7 x weekly - 2 sets - 10 reps - Seated Shoulder Flexion Towel Slide at Table Top  - 1 x daily - 7 x weekly - 2 sets - 10 reps - Seated Shoulder Scaption Slide at Table Top with Forearm  in Neutral  - 1 x daily - 7 x weekly - 2 sets - 10 reps - Seated Scapular Retraction  - 1 x daily - 7 x weekly - 2 sets - 10 reps   ASSESSMENT:  CLINICAL IMPRESSION: Isaiah Gibson continues to attend skilled physical therapy to address recovery of function L shoulder.  Added more wand stretching especially for ER today.  Kinesiotaping trial to relieve some of the strain L upper traps, scapular retractors.  Isaiah Gibson will benefit from continued skilled PT to address above deficits to improve mobility and activity tolerance with decreased pain interference.  OBJECTIVE IMPAIRMENTS: decreased activity tolerance, decreased ROM, decreased strength, increased edema, increased fascial restrictions, impaired flexibility, and pain.   ACTIVITY LIMITATIONS: carrying, lifting, sleeping, bathing, toileting, dressing, and reach over head  PARTICIPATION LIMITATIONS: meal prep, cleaning, laundry, driving, shopping, community activity, and yard work  PERSONAL FACTORS: Fitness, Past/current experiences, Time since onset of injury/illness/exacerbation, Transportation, and 1-2 comorbidities: psoriasis, LBP  are also affecting patient's functional outcome.   REHAB POTENTIAL: Good  CLINICAL DECISION MAKING: Stable/uncomplicated  EVALUATION COMPLEXITY: Low   GOALS: Goals reviewed with patient? Yes  SHORT TERM GOALS: Target date: 4 weeks, 12/02/22  ROM L shoulder flex , abd 90, ER 30 Baseline:flex 70, abd 80, Er -18 Goal status: IN PROGRESS  LONG TERM GOALS: Target date: 12 weeks: 01/27/23  I HEP Baseline: TBD Goal status: Met for current HEP- 11/22/22  2.  L shoulder ROM for elevation 130 or greater for functional reach, ADL's Baseline:  Goal status: IN PROGRESS  3.  L shoulder ER 60 or greater for functional reach, IR hand behind back to L 1 for dressing, bathing  Baseline:  Goal status: IN PROGRESS  4.  Quick DASH L shoulder 25% or less Baseline: 79.5% disability Goal status: IN PROGRESS  5.  Strength L  shoulder 4+/5 all planes for ability to play golf, perform yard work, lifting Baseline:  Goal status: IN PROGRESS   PLAN:  PT FREQUENCY: 2x/week  PT DURATION: 12 weeks  PLANNED INTERVENTIONS: Therapeutic exercises, Therapeutic activity, Neuromuscular re-education, Balance training, Gait training, Patient/Family education, Self Care, and Joint mobilization  PLAN FOR NEXT SESSION: continue PROM and AAROM until cleared by orthopedist to advance - try wand AAROM   Maaz Spiering L Avraham Benish, PT,DPT, OCS 11/25/2022, 1:28 PM

## 2022-11-26 ENCOUNTER — Telehealth: Payer: Self-pay | Admitting: Internal Medicine

## 2022-11-26 ENCOUNTER — Other Ambulatory Visit: Payer: Self-pay

## 2022-11-26 MED ORDER — WEGOVY 0.25 MG/0.5ML ~~LOC~~ SOAJ: SUBCUTANEOUS | 3 refills | Status: DC

## 2022-11-26 NOTE — Telephone Encounter (Signed)
Sent to [facility]

## 2022-11-26 NOTE — Telephone Encounter (Signed)
Patient would like to move his [facility] .5 mg to [facility]. Best callback is [phone].

## 2022-11-29 ENCOUNTER — Ambulatory Visit: Payer: BC Managed Care – PPO

## 2022-11-29 DIAGNOSIS — M6281 Muscle weakness (generalized): Secondary | ICD-10-CM

## 2022-11-29 DIAGNOSIS — M25612 Stiffness of left shoulder, not elsewhere classified: Secondary | ICD-10-CM

## 2022-11-29 DIAGNOSIS — Z8781 Personal history of (healed) traumatic fracture: Secondary | ICD-10-CM | POA: Diagnosis not present

## 2022-11-29 DIAGNOSIS — M25512 Pain in left shoulder: Secondary | ICD-10-CM

## 2022-11-29 NOTE — Therapy (Signed)
OUTPATIENT PHYSICAL THERAPY TREATMENT   Patient Name: Isaiah Gibson MRN: [id number] DOB:21-Dec-1985, 37 y.o., male Today's Date: 11/29/2022  END OF SESSION:  PT End of Session - 11/29/22 1150     Visit Number 7    Date for PT Re-Evaluation 01/27/23    Progress Note Due on Visit 10    PT Start Time 1106   pt late   PT Stop Time 1155    PT Time Calculation (min) 49 min    Activity Tolerance Patient tolerated treatment well    Behavior During Therapy [facility] for tasks assessed/performed                   Past Medical History:  Diagnosis Date   ADD 10/20/2006   BACK PAIN 05/15/2007   GERD 10/20/2006   HEMATOCHEZIA 12/18/2009   INSOMNIA-SLEEP DISORDER-UNSPEC 05/15/2007   Palpitations 01/25/2008   POLYCYTHEMIA 05/20/2009   PSORIASIS 10/20/2006   SMOKER 05/15/2007   VENEREAL WART 05/15/2007   Past Surgical History:  Procedure Laterality Date   HAND SURGERY Right    ORIF SHOULDER FRACTURE Left 10/08/2022   Procedure: OPEN REDUCTION INTERNAL FIXATION (ORIF) GREATER TUBEROSITY FRACTURE;  Surgeon: Cammy Copa, MD;  Location: [facility] OR;  Service: Orthopedics;  Laterality: Left;  HANDY BED   WISDOM TOOTH EXTRACTION     Patient Active Problem List   Diagnosis Date Noted   Closed fracture dislocation of shoulder with routine healing 10/09/2022   Obesity (BMI 35.0-39.9 without comorbidity) 07/22/2022   Vitamin D deficiency 07/22/2022   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   Hematospermia 09/11/2020   Fatigue 09/27/2019   Erectile dysfunction 09/27/2019   Upper respiratory tract infection 09/27/2019   Screening-pulmonary TB 04/23/2017   Anxiety 04/14/2016   Panic attacks 04/14/2016   Weight gain 11/26/2015   Former smoker 11/26/2015   Pectoralis muscle strain 04/22/2015   Tendinitis of forearm 04/22/2015   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 04/16/2012   Dizziness 01/25/2011   Urinary frequency 01/25/2011   Bilateral leg edema 01/25/2011   Encounter for well adult  exam with abnormal findings 01/02/2011   HEMATOCHEZIA 12/18/2009   WRIST PAIN, RIGHT 12/09/2009   ANKLE PAIN, RIGHT 12/09/2009   POLYCYTHEMIA 05/20/2009   RASH-NONVESICULAR 09/04/2008   ANKLE SPRAIN, LEFT 05/31/2008   Palpitations 01/25/2008   VENEREAL WART 05/15/2007   SMOKER 05/15/2007   BACK PAIN 05/15/2007   INSOMNIA-SLEEP DISORDER-UNSPEC 05/15/2007   Attention deficit disorder 10/20/2006   GERD 10/20/2006   PSORIASIS 10/20/2006    PCP: Oliver Barre, MD  REFERRING PROVIDER: Julieanne Cotton, PA-C  REFERRING DIAG: L proximal humerus fx and dislocation, s/p ORIF  THERAPY DIAG:  History of left shoulder fracture  Stiffness of left shoulder, not elsewhere classified  Acute pain of left shoulder  Muscle weakness of left arm  Rationale for Evaluation and Treatment: Rehabilitation  ONSET DATE: 10/08/22  NEXT MD VISIT: 12/01/22   SUBJECTIVE:  SUBJECTIVE STATEMENT: Pt reports yesterday his arm felt tight but the exercises help a lot with the stiffness.  Hand dominance: Right  PERTINENT HISTORY: Open reduction and internal fixation of left proximal humerus fracture.   PAIN:  Are you having pain? Yes: NPRS scale: 3 /10 Pain location: L neck and shoulder into scapula  Pain description: stiff, aching, worse in am, when wakes up Aggravating factors: lying down Relieving factors: oxycodone, muscle relaxer  PRECAUTIONS: Other: As of 11/03/22 ortho PA notes: He is okay for full passive range of motion and okay to start active assisted range of motion.  No full active range of motion or rotator cuff strengthening exercises until cleared at his next appointment.  Follow-up in 4 weeks for clinical recheck with Dr. August Saucer.  RED FLAGS: None   WEIGHT BEARING RESTRICTIONS: Yes NWB L UE  FALLS:  Has  patient fallen in last 6 months? Yes. Number of falls 1  LIVING ENVIRONMENT: Lives with: lives with their family Lives in: House/apartment Stairs: Yes: Internal: 11 steps; on left going up and External: 3 steps; on right going up Has following equipment at home: None  OCCUPATION: Multimedia programmer, on computer  PLOF: Independent  PATIENT GOALS:to be able to participate in golfing, activities, with family, use L arm for dressing, bathing, I   OBJECTIVE:   DIAGNOSTIC FINDINGS:  Images available in epic chart review. Has plate and 8 screws L prox humerus  PATIENT SURVEYS:  Quick Dash 79.5%  COGNITION: Overall cognitive status: Within functional limits for tasks assessed     SENSATION: WFL  POSTURE: Soft sling L shoulder. Rounding L shoulder  UPPER EXTREMITY ROM: PROM L shoulder flexion 70, abd 80, ER -18 R shoulder wfl  Passive ROM Left eval L 11/29/22 AAROM with wand  Shoulder flexion 70 105  Shoulder extension    Shoulder abduction 80 115  Shoulder adduction    Shoulder internal rotation    Shoulder external rotation -18 10  Elbow flexion    Elbow extension    Wrist flexion    Wrist extension    Wrist ulnar deviation    Wrist radial deviation    Wrist pronation    Wrist supination     (Blank rows = not tested)  UPPER EXTREMITY MMT: L UE MMT not performed due to recent injury , restrictions. Did note active contraction of all shoulder musculature and elbow, wrist, hand musculature  R hand and wrist deficits with grasp strength due to previous traumatic injury  SHOULDER SPECIAL TESTS: NA due to orthopedic restrictions   JOINT MOBILITY TESTING:  na  PALPATION:  Mild undulation L lateral upper arm mild edema L upper arm    TODAY'S TREATMENT:                                                                                                                                         DATE:  11/29/22 Seated swiffer AAROM slides into flexion x 15; scaption x  15 Pulleys x 3 min flexion; x 3 min scaption Supine wand flexion x 10  Supine wand ER x 10  Measured ROM  Vasopnuematic: game ready 10 min at end of session L shoulder in sitting. 11/25/22 Therapeutic exercise: to improve flexibility L shoulder Supine PROM/ AAROM L shoulder mass practice  Added supine cane ER 5 sec holds, 10 x Added supine cane scapular punches therapist with light support L arm to maintain vertical position Supine cane flexion assisted from 90 to 100, 5 reps R side lying for scapular clocks, therapist providing tactile cues with hands Standing table slides with basketball on counter flex, scaption 10x Kinesiotape L shoulder, 2- I pieces , one from mid delts to middle traps, one from lateral delts to L upper traps, 35% pull  Vasopnuematic: game ready 10 min at end of session L shoulder in sitting.  11/22/22 MANUAL THERAPY: To promote normalized muscle tension, improved flexibility, improved joint mobility, increased ROM, and reduced pain.  STM/DTM and manual TPR to L UT, LS, rhomboids, middle & lower traps, lats  THERAPEUTIC EXERCISE: to improve flexibility, strength and mobility.  Demonstration, verbal and tactile cues throughout for technique. Pulleys: Flexion x 3 min Seated L AA flexion with swiffer x 10 Seated L AA scaption with swiffer x 10 Seated L AA circles with swiffer x 10  PROM to L shoulder MODALITIES: Game Ready vasopneumatic compression post session to L shoulder x 10 min, low compression, 34 to reduce post-exercise pain and swelling/edema  11/15/22 THERAPEUTIC EXERCISE: to improve flexibility, strength and mobility.  Demonstration, verbal and tactile cues throughout for technique. Pulleys: Flexion x 3 min B rhomboid (lower cervical/upper thoracic) stretch 3 x 30" - hands supported on Swiffer handle L UT stretch 3 x 30" L LS stretch 3 x 30" L shoulder flexion AAROM - holding onto Swiffer handle (as UE ranger) x 10 L shoulder scaption AAROM - holding  onto Swiffer handle (as UE ranger) x 10 L shoulder pendulums - flexion/extension, horiz ABD/ADD, CW/CCW x 30" each   MANUAL THERAPY: To promote normalized muscle tension, improved flexibility, improved joint mobility, increased ROM, and reduced pain.  STM/DTM and manual TPR to L UT, LS, rhomboids, middle & lower traps, infraspinatus L shoulder gentle distraction +/- CW/CCW oscillations to promote muscle relaxation L shoulder PROM to tolerance in supine  MODALITIES: Game Ready vasopneumatic compression post session to L shoulder x 10 min, low compression, 34 to reduce post-exercise pain and swelling/edema   11/11/22  Therapeutic Exercise: to improve strength and mobility.  Demo, verbal and tactile cues throughout for technique.  Table slides into flexion x 10  Table slides scaption x 10  Seated L UT 2x30 sec Seated L LS 2x30 sec Seated scap retraction 10x3" PROM to L shoulder flexion and scaption Manual Therapy: STM to L UT, LS, rhomboids  Vaso to L shoulder for edema 34 deg ;low compression x 10 min   11/09/22 Therapeutic Exercise: to improve strength and mobility.  Demo, verbal and tactile cues throughout for technique.  PROM within MD precautions Putty squeezes for hand and wrist x 10  Seated L UT stretch 2x30 sec Seated L LS stretch 2x30 sec Seated thoracic extension 10x3" Seated table slides into flexion PROM x 10    PATIENT EDUCATION: Education details: HEP review and HEP modification - use of Swiffer as alternative for table slides Person educated: Patient and Spouse Education method: Explanation, Demonstration, Tactile cues,  and Verbal cues Education comprehension: verbalized understanding and returned demonstration  HOME EXERCISE PROGRAM: Access Code: [email or web address] URL: [email or web address] Date: 11/11/2022 Prepared by: Verta Ellen  Exercises - Seated Upper Trapezius Stretch  - 2 x daily - 7 x weekly - 2 sets - 2 reps - 30 sec hold - Gentle Levator  Scapulae Stretch  - 2 x daily - 7 x weekly - 2 sets - 2 reps - 30 sec hold - Seated Thoracic Extension AROM  - 1 x daily - 7 x weekly - 2 sets - 10 reps - Seated Shoulder Flexion Towel Slide at Table Top  - 1 x daily - 7 x weekly - 2 sets - 10 reps - Seated Shoulder Scaption Slide at Table Top with Forearm in Neutral  - 1 x daily - 7 x weekly - 2 sets - 10 reps - Seated Scapular Retraction  - 1 x daily - 7 x weekly - 2 sets - 10 reps   ASSESSMENT:  CLINICAL IMPRESSION: Continued with AAROM exercises to improve functional use of L shoulder. He demonstrates improved AAROM of L shoulder with wand according to measurements. He is very limited with ER using the wand. Arvon will benefit from continued skilled PT to address above deficits to improve mobility and activity tolerance with decreased pain interference.  OBJECTIVE IMPAIRMENTS: decreased activity tolerance, decreased ROM, decreased strength, increased edema, increased fascial restrictions, impaired flexibility, and pain.   ACTIVITY LIMITATIONS: carrying, lifting, sleeping, bathing, toileting, dressing, and reach over head  PARTICIPATION LIMITATIONS: meal prep, cleaning, laundry, driving, shopping, community activity, and yard work  PERSONAL FACTORS: Fitness, Past/current experiences, Time since onset of injury/illness/exacerbation, Transportation, and 1-2 comorbidities: psoriasis, LBP  are also affecting patient's functional outcome.   REHAB POTENTIAL: Good  CLINICAL DECISION MAKING: Stable/uncomplicated  EVALUATION COMPLEXITY: Low   GOALS: Goals reviewed with patient? Yes  SHORT TERM GOALS: Target date: 4 weeks, 12/02/22  ROM L shoulder flex , abd 90, ER 30 Baseline:flex 70, abd 80, Er -18 Goal status: IN PROGRESS- met for flexion- 11/29/22  LONG TERM GOALS: Target date: 12 weeks: 01/27/23  I HEP Baseline: TBD Goal status: Met for current HEP- 11/22/22  2.  L shoulder ROM for elevation 130 or greater for functional reach,  ADL's Baseline:  Goal status: IN PROGRESS  3.  L shoulder ER 60 or greater for functional reach, IR hand behind back to L 1 for dressing, bathing  Baseline:  Goal status: IN PROGRESS  4.  Quick DASH L shoulder 25% or less Baseline: 79.5% disability Goal status: IN PROGRESS  5.  Strength L shoulder 4+/5 all planes for ability to play golf, perform yard work, lifting Baseline:  Goal status: IN PROGRESS   PLAN:  PT FREQUENCY: 2x/week  PT DURATION: 12 weeks  PLANNED INTERVENTIONS: Therapeutic exercises, Therapeutic activity, Neuromuscular re-education, Balance training, Gait training, Patient/Family education, Self Care, and Joint mobilization  PLAN FOR NEXT SESSION: continue PROM and AAROM until cleared by orthopedist to advance - try wand Arsenio Loader, PTA 11/29/2022, 12:36 PM

## 2022-12-01 ENCOUNTER — Ambulatory Visit: Payer: BC Managed Care – PPO | Admitting: Orthopedic Surgery

## 2022-12-01 DIAGNOSIS — S42252D Displaced fracture of greater tuberosity of left humerus, subsequent encounter for fracture with routine healing: Secondary | ICD-10-CM

## 2022-12-02 ENCOUNTER — Other Ambulatory Visit: Payer: Self-pay

## 2022-12-02 ENCOUNTER — Ambulatory Visit: Payer: BC Managed Care – PPO

## 2022-12-02 DIAGNOSIS — M6281 Muscle weakness (generalized): Secondary | ICD-10-CM

## 2022-12-02 DIAGNOSIS — Z8781 Personal history of (healed) traumatic fracture: Secondary | ICD-10-CM | POA: Diagnosis not present

## 2022-12-02 DIAGNOSIS — M25612 Stiffness of left shoulder, not elsewhere classified: Secondary | ICD-10-CM

## 2022-12-02 DIAGNOSIS — M25512 Pain in left shoulder: Secondary | ICD-10-CM

## 2022-12-02 NOTE — Therapy (Signed)
OUTPATIENT PHYSICAL THERAPY TREATMENT   Patient Name: Isaiah Gibson MRN: [id number] DOB:Jun 29, 1985, 37 y.o., male Today's Date: 12/02/2022  END OF SESSION:  PT End of Session - 12/02/22 1629     Visit Number 8    Date for PT Re-Evaluation 01/27/23    Progress Note Due on Visit 10    PT Start Time 1022    PT Stop Time 1105    PT Time Calculation (min) 43 min    Activity Tolerance Patient tolerated treatment well    Behavior During Therapy [facility] for tasks assessed/performed                    Past Medical History:  Diagnosis Date   ADD 10/20/2006   BACK PAIN 05/15/2007   GERD 10/20/2006   HEMATOCHEZIA 12/18/2009   INSOMNIA-SLEEP DISORDER-UNSPEC 05/15/2007   Palpitations 01/25/2008   POLYCYTHEMIA 05/20/2009   PSORIASIS 10/20/2006   SMOKER 05/15/2007   VENEREAL WART 05/15/2007   Past Surgical History:  Procedure Laterality Date   HAND SURGERY Right    ORIF SHOULDER FRACTURE Left 10/08/2022   Procedure: OPEN REDUCTION INTERNAL FIXATION (ORIF) GREATER TUBEROSITY FRACTURE;  Surgeon: Cammy Copa, MD;  Location: [facility] OR;  Service: Orthopedics;  Laterality: Left;  HANDY BED   WISDOM TOOTH EXTRACTION     Patient Active Problem List   Diagnosis Date Noted   Closed fracture dislocation of shoulder with routine healing 10/09/2022   Obesity (BMI 35.0-39.9 without comorbidity) 07/22/2022   Vitamin D deficiency 07/22/2022   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   Hematospermia 09/11/2020   Fatigue 09/27/2019   Erectile dysfunction 09/27/2019   Upper respiratory tract infection 09/27/2019   Screening-pulmonary TB 04/23/2017   Anxiety 04/14/2016   Panic attacks 04/14/2016   Weight gain 11/26/2015   Former smoker 11/26/2015   Pectoralis muscle strain 04/22/2015   Tendinitis of forearm 04/22/2015   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 04/16/2012   Dizziness 01/25/2011   Urinary frequency 01/25/2011   Bilateral leg edema 01/25/2011   Encounter for well adult exam  with abnormal findings 01/02/2011   HEMATOCHEZIA 12/18/2009   WRIST PAIN, RIGHT 12/09/2009   ANKLE PAIN, RIGHT 12/09/2009   POLYCYTHEMIA 05/20/2009   RASH-NONVESICULAR 09/04/2008   ANKLE SPRAIN, LEFT 05/31/2008   Palpitations 01/25/2008   VENEREAL WART 05/15/2007   SMOKER 05/15/2007   BACK PAIN 05/15/2007   INSOMNIA-SLEEP DISORDER-UNSPEC 05/15/2007   Attention deficit disorder 10/20/2006   GERD 10/20/2006   PSORIASIS 10/20/2006    PCP: Oliver Barre, MD  REFERRING PROVIDER: Julieanne Cotton, PA-C  REFERRING DIAG: L proximal humerus fx and dislocation, s/p ORIF  THERAPY DIAG:  History of left shoulder fracture  Stiffness of left shoulder, not elsewhere classified  Acute pain of left shoulder  Muscle weakness of left arm  Rationale for Evaluation and Treatment: Rehabilitation  ONSET DATE: 10/08/22  NEXT MD VISIT: 12/01/22   SUBJECTIVE:  SUBJECTIVE STATEMENT: Pt reports orthopedist will be performing arthroscopic surgery to release tendons in one month if his ER motion  L shoulder doesn't improve.  Orthopedist wants pt to be more aggressive with stretching now. Hand dominance: Right  PERTINENT HISTORY: Open reduction and internal fixation of left proximal humerus fracture.   PAIN:  Are you having pain? Yes: NPRS scale: 3 /10 Pain location: L neck and shoulder into scapula  Pain description: stiff, aching, worse in am, when wakes up Aggravating factors: lying down Relieving factors: oxycodone, muscle relaxer  PRECAUTIONS: Other: As of 11/03/22 ortho PA notes: He is okay for full passive range of motion and okay to start active assisted range of motion.  No full active range of motion or rotator cuff strengthening exercises until cleared at his next appointment.  Follow-up in 4 weeks for  clinical recheck with Dr. August Saucer.  RED FLAGS: None   WEIGHT BEARING RESTRICTIONS: Yes NWB L UE  FALLS:  Has patient fallen in last 6 months? Yes. Number of falls 1  LIVING ENVIRONMENT: Lives with: lives with their family Lives in: House/apartment Stairs: Yes: Internal: 11 steps; on left going up and External: 3 steps; on right going up Has following equipment at home: None  OCCUPATION: Multimedia programmer, on computer  PLOF: Independent  PATIENT GOALS:to be able to participate in golfing, activities, with family, use L arm for dressing, bathing, I   OBJECTIVE:   DIAGNOSTIC FINDINGS:  Images available in epic chart review. Has plate and 8 screws L prox humerus  PATIENT SURVEYS:  Quick Dash 79.5%  COGNITION: Overall cognitive status: Within functional limits for tasks assessed     SENSATION: WFL  POSTURE: Soft sling L shoulder. Rounding L shoulder  UPPER EXTREMITY ROM: PROM L shoulder flexion 70, abd 80, ER -18 R shoulder wfl  Passive ROM Left eval L 11/29/22 AAROM with wand  Shoulder flexion 70 105  Shoulder extension    Shoulder abduction 80 115  Shoulder adduction    Shoulder internal rotation    Shoulder external rotation -18 10  Elbow flexion    Elbow extension    Wrist flexion    Wrist extension    Wrist ulnar deviation    Wrist radial deviation    Wrist pronation    Wrist supination     (Blank rows = not tested)  UPPER EXTREMITY MMT: L UE MMT not performed due to recent injury , restrictions. Did note active contraction of all shoulder musculature and elbow, wrist, hand musculature  R hand and wrist deficits with grasp strength due to previous traumatic injury  SHOULDER SPECIAL TESTS: NA due to orthopedic restrictions   JOINT MOBILITY TESTING:  na  PALPATION:  Mild undulation L lateral upper arm mild edema L upper arm    TODAY'S TREATMENT:  DATE: 12/02/22:  Pulleys 5 min Seated with towel under L axilla for wand ER, 10x  Various supine stretching therex, performed with intermittent light GH distraction and shaking to relieve pain Supine for therapist assisted L serratus punches Supine for therapist assisted L Pec flys to stretch L ant chest Supine sustained stretch into ER with red theraband in palm, tolerated 1 1/2 min R Side lying for scapular mobs , lat glides Side lying therapist assisted punches to ceiling  R side lying shoulder abduction stretch R sidelying for L shoulder ER assisted.  Supine thoracic spine stretch with 1/2 foam roller along thoracic spine to open L ant chest wall  Moist heat ant and post shoulder at end of session, not billed  11/29/22 Seated swiffer AAROM slides into flexion x 15; scaption x 15 Pulleys x 3 min flexion; x 3 min scaption Supine wand flexion x 10  Supine wand ER x 10  Measured ROM  Vasopnuematic: game ready 10 min at end of session L shoulder in sitting. 11/25/22 Therapeutic exercise: to improve flexibility L shoulder Supine PROM/ AAROM L shoulder mass practice  Added supine cane ER 5 sec holds, 10 x Added supine cane scapular punches therapist with light support L arm to maintain vertical position Supine cane flexion assisted from 90 to 100, 5 reps R side lying for scapular clocks, therapist providing tactile cues with hands Standing table slides with basketball on counter flex, scaption 10x Kinesiotape L shoulder, 2- I pieces , one from mid delts to middle traps, one from lateral delts to L upper traps, 35% pull  Vasopnuematic: game ready 10 min at end of session L shoulder in sitting.  11/22/22 MANUAL THERAPY: To promote normalized muscle tension, improved flexibility, improved joint mobility, increased ROM, and reduced pain.  STM/DTM and manual TPR to L UT, LS, rhomboids, middle & lower traps, lats  THERAPEUTIC EXERCISE: to improve flexibility,  strength and mobility.  Demonstration, verbal and tactile cues throughout for technique. Pulleys: Flexion x 3 min Seated L AA flexion with swiffer x 10 Seated L AA scaption with swiffer x 10 Seated L AA circles with swiffer x 10  PROM to L shoulder MODALITIES: Game Ready vasopneumatic compression post session to L shoulder x 10 min, low compression, 34 to reduce post-exercise pain and swelling/edema  11/15/22 THERAPEUTIC EXERCISE: to improve flexibility, strength and mobility.  Demonstration, verbal and tactile cues throughout for technique. Pulleys: Flexion x 3 min B rhomboid (lower cervical/upper thoracic) stretch 3 x 30" - hands supported on Swiffer handle L UT stretch 3 x 30" L LS stretch 3 x 30" L shoulder flexion AAROM - holding onto Swiffer handle (as UE ranger) x 10 L shoulder scaption AAROM - holding onto Swiffer handle (as UE ranger) x 10 L shoulder pendulums - flexion/extension, horiz ABD/ADD, CW/CCW x 30" each   MANUAL THERAPY: To promote normalized muscle tension, improved flexibility, improved joint mobility, increased ROM, and reduced pain.  STM/DTM and manual TPR to L UT, LS, rhomboids, middle & lower traps, infraspinatus L shoulder gentle distraction +/- CW/CCW oscillations to promote muscle relaxation L shoulder PROM to tolerance in supine  MODALITIES: Game Ready vasopneumatic compression post session to L shoulder x 10 min, low compression, 34 to reduce post-exercise pain and swelling/edema   11/11/22  Therapeutic Exercise: to improve strength and mobility.  Demo, verbal and tactile cues throughout for technique.  Table slides into flexion x 10  Table slides scaption x 10  Seated L UT 2x30 sec Seated  L LS 2x30 sec Seated scap retraction 10x3" PROM to L shoulder flexion and scaption Manual Therapy: STM to L UT, LS, rhomboids  Vaso to L shoulder for edema 34 deg ;low compression x 10 min   11/09/22 Therapeutic Exercise: to improve strength and mobility.   Demo, verbal and tactile cues throughout for technique.  PROM within MD precautions Putty squeezes for hand and wrist x 10  Seated L UT stretch 2x30 sec Seated L LS stretch 2x30 sec Seated thoracic extension 10x3" Seated table slides into flexion PROM x 10    PATIENT EDUCATION: Education details: HEP review and HEP modification - use of Swiffer as alternative for table slides Person educated: Patient and Spouse Education method: Explanation, Demonstration, Tactile cues, and Verbal cues Education comprehension: verbalized understanding and returned demonstration  HOME EXERCISE PROGRAM: Access Code: [email or web address] URL: [email or web address] Date: 11/11/2022 Prepared by: Verta Ellen  Exercises - Seated Upper Trapezius Stretch  - 2 x daily - 7 x weekly - 2 sets - 2 reps - 30 sec hold - Gentle Levator Scapulae Stretch  - 2 x daily - 7 x weekly - 2 sets - 2 reps - 30 sec hold - Seated Thoracic Extension AROM  - 1 x daily - 7 x weekly - 2 sets - 10 reps - Seated Shoulder Flexion Towel Slide at Table Top  - 1 x daily - 7 x weekly - 2 sets - 10 reps - Seated Shoulder Scaption Slide at Table Top with Forearm in Neutral  - 1 x daily - 7 x weekly - 2 sets - 10 reps - Seated Scapular Retraction  - 1 x daily - 7 x weekly - 2 sets - 10 reps   ASSESSMENT:  CLINICAL IMPRESSION: Continued with AAROM exercises to improve functional use of L shoulder. He demonstrates improved AAROM of L shoulder with wand according to measurements. He is very limited with ER using the wand. Jereme will benefit from continued skilled PT to address above deficits to improve mobility and activity tolerance with decreased pain interference.  OBJECTIVE IMPAIRMENTS: decreased activity tolerance, decreased ROM, decreased strength, increased edema, increased fascial restrictions, impaired flexibility, and pain.   ACTIVITY LIMITATIONS: carrying, lifting, sleeping, bathing, toileting, dressing, and reach over  head  PARTICIPATION LIMITATIONS: meal prep, cleaning, laundry, driving, shopping, community activity, and yard work  PERSONAL FACTORS: Fitness, Past/current experiences, Time since onset of injury/illness/exacerbation, Transportation, and 1-2 comorbidities: psoriasis, LBP  are also affecting patient's functional outcome.   REHAB POTENTIAL: Good  CLINICAL DECISION MAKING: Stable/uncomplicated  EVALUATION COMPLEXITY: Low   GOALS: Goals reviewed with patient? Yes  SHORT TERM GOALS: Target date: 4 weeks, 12/02/22  ROM L shoulder flex , abd 90, ER 30 Baseline:flex 70, abd 80, Er -18 Goal status: IN PROGRESS- met for flexion- 11/29/22  LONG TERM GOALS: Target date: 12 weeks: 01/27/23  I HEP Baseline: TBD Goal status: Met for current HEP- 11/22/22  2.  L shoulder ROM for elevation 130 or greater for functional reach, ADL's Baseline:  Goal status: IN PROGRESS  3.  L shoulder ER 60 or greater for functional reach, IR hand behind back to L 1 for dressing, bathing  Baseline:  Goal status: IN PROGRESS  4.  Quick DASH L shoulder 25% or less Baseline: 79.5% disability Goal status: IN PROGRESS  5.  Strength L shoulder 4+/5 all planes for ability to play golf, perform yard work, lifting Baseline:  Goal status: IN PROGRESS   PLAN:  PT FREQUENCY: 2x/week  PT DURATION:  12 weeks  PLANNED INTERVENTIONS: Therapeutic exercises, Therapeutic activity, Neuromuscular re-education, Balance training, Gait training, Patient/Family education, Self Care, and Joint mobilization  PLAN FOR NEXT SESSION: continue PROM and AAROM , try to engage with Er as much as pt can tolerate   Corlette Ciano L Emmanuella Mirante, PT, DPT, OCS 12/02/2022, 4:37 PM

## 2022-12-02 NOTE — Progress Notes (Signed)
Post-Op Visit Note   Patient: Isaiah Gibson           Date of Birth: [birth year]           MRN: [id number] Visit Date: 12/01/2022 PCP: Corwin Levins, MD   Assessment & Plan:  Chief Complaint:  Chief Complaint  Patient presents with   Left Shoulder - Routine Post Op    LEFT SHOULDER ORIF (surgery date 10-08-22)   Visit Diagnoses:  1. Closed displaced fracture of greater tuberosity of left humerus with routine healing, subsequent encounter     Plan: Isaiah Gibson is a 37 year old patient who is now about 8 weeks out from left shoulder open reduction internal fixation.  Works as a Hospital doctor.  On exam his external rotation and range of motion is 10/70/110.  DC sling.  4-week return with decision for or against manipulation under anesthesia and possible rotator interval release.  I did encourage him to try to get as much motion as possible without feeling much in the way of lifting.  Follow-Up Instructions: No follow-ups on file.   Orders:  No orders of the defined types were placed in this encounter.  No orders of the defined types were placed in this encounter.   Imaging: No results found.  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   Hematospermia 09/11/2020   Fatigue 09/27/2019   Erectile dysfunction 09/27/2019   Upper respiratory tract infection 09/27/2019   Screening-pulmonary TB 04/23/2017   Anxiety 04/14/2016   Panic attacks 04/14/2016   Weight gain 11/26/2015   Former smoker 11/26/2015   Pectoralis muscle strain 04/22/2015   Tendinitis of forearm 04/22/2015   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 04/16/2012   Dizziness 01/25/2011   Urinary frequency 01/25/2011   Bilateral leg edema 01/25/2011   Encounter for well adult exam with abnormal  findings 01/02/2011   HEMATOCHEZIA 12/18/2009   WRIST PAIN, RIGHT 12/09/2009   ANKLE PAIN, RIGHT 12/09/2009   POLYCYTHEMIA 05/20/2009   RASH-NONVESICULAR 09/04/2008   ANKLE SPRAIN, LEFT 05/31/2008   Palpitations 01/25/2008   VENEREAL WART 05/15/2007   SMOKER 05/15/2007   BACK PAIN 05/15/2007   INSOMNIA-SLEEP DISORDER-UNSPEC 05/15/2007   Attention deficit disorder 10/20/2006   GERD 10/20/2006   PSORIASIS 10/20/2006   Past Medical History:  Diagnosis Date   ADD 10/20/2006   BACK PAIN 05/15/2007   GERD 10/20/2006   HEMATOCHEZIA 12/18/2009   INSOMNIA-SLEEP DISORDER-UNSPEC 05/15/2007   Palpitations 01/25/2008   POLYCYTHEMIA 05/20/2009   PSORIASIS 10/20/2006   SMOKER 05/15/2007   VENEREAL WART 05/15/2007    Family History  Problem Relation Age of Onset   Hypertension Other        Grandfather    Past Surgical History:  Procedure Laterality Date   HAND SURGERY Right    ORIF SHOULDER FRACTURE Left 10/08/2022   Procedure: OPEN REDUCTION INTERNAL FIXATION (ORIF) GREATER TUBEROSITY FRACTURE;  Surgeon: Cammy Copa, MD;  Location: [facility] OR;  Service: Orthopedics;  Laterality: Left;  HANDY BED   WISDOM TOOTH EXTRACTION     Social History   Occupational History   Not on file  Tobacco Use   Smoking status: Former    Current packs/day: 0.00    Types: Cigarettes    Quit date: 09/19/2014    Years since quitting: 8.2  Smokeless tobacco: Never  Vaping Use   Vaping status: Never Used  Substance and Sexual Activity   Alcohol use: Yes    Alcohol/week: 7.0 standard drinks of alcohol    Types: 7 Shots of liquor per week    Comment: daily ETOH hard liquor   Drug use: Yes    Types: Marijuana    Comment: smoke and gummies   Sexual activity: Not on file

## 2022-12-03 ENCOUNTER — Other Ambulatory Visit: Payer: Self-pay | Admitting: Internal Medicine

## 2022-12-03 MED ORDER — AMPHETAMINE-DEXTROAMPHETAMINE 30 MG PO TABS: 30.0000 mg | ORAL_TABLET | Freq: Two times a day (BID) | ORAL | 0 refills | Status: DC

## 2022-12-04 ENCOUNTER — Encounter: Payer: Self-pay | Admitting: Orthopedic Surgery

## 2022-12-14 ENCOUNTER — Ambulatory Visit: Payer: BC Managed Care – PPO

## 2022-12-14 ENCOUNTER — Other Ambulatory Visit: Payer: Self-pay

## 2022-12-14 DIAGNOSIS — M25512 Pain in left shoulder: Secondary | ICD-10-CM

## 2022-12-14 DIAGNOSIS — M25612 Stiffness of left shoulder, not elsewhere classified: Secondary | ICD-10-CM

## 2022-12-14 DIAGNOSIS — M6281 Muscle weakness (generalized): Secondary | ICD-10-CM

## 2022-12-14 DIAGNOSIS — Z8781 Personal history of (healed) traumatic fracture: Secondary | ICD-10-CM | POA: Diagnosis not present

## 2022-12-14 NOTE — Therapy (Signed)
OUTPATIENT PHYSICAL THERAPY TREATMENT   Patient Name: Isaiah Gibson MRN: [id number] DOB:[birth year], 36 y.o., male Today's Date: 12/14/2022  END OF SESSION:  PT End of Session - 12/14/22 1025     Visit Number 9    Date for PT Re-Evaluation 01/27/23    Progress Note Due on Visit 10    PT Start Time 1024                     Past Medical History:  Diagnosis Date   ADD 10/20/2006   BACK PAIN 05/15/2007   GERD 10/20/2006   HEMATOCHEZIA 12/18/2009   INSOMNIA-SLEEP DISORDER-UNSPEC 05/15/2007   Palpitations 01/25/2008   POLYCYTHEMIA 05/20/2009   PSORIASIS 10/20/2006   SMOKER 05/15/2007   VENEREAL WART 05/15/2007   Past Surgical History:  Procedure Laterality Date   HAND SURGERY Right    ORIF SHOULDER FRACTURE Left 10/08/2022   Procedure: OPEN REDUCTION INTERNAL FIXATION (ORIF) GREATER TUBEROSITY FRACTURE;  Surgeon: Cammy Copa, MD;  Location: [facility] OR;  Service: Orthopedics;  Laterality: Left;  HANDY BED   WISDOM TOOTH EXTRACTION     Patient Active Problem List   Diagnosis Date Noted   Closed fracture dislocation of shoulder with routine healing 10/09/2022   Obesity (BMI 35.0-39.9 without comorbidity) 07/22/2022   Vitamin D deficiency 07/22/2022   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   Hematospermia 09/11/2020   Fatigue 09/27/2019   Erectile dysfunction 09/27/2019   Upper respiratory tract infection 09/27/2019   Screening-pulmonary TB 04/23/2017   Anxiety 04/14/2016   Panic attacks 04/14/2016   Weight gain 11/26/2015   Former smoker 11/26/2015   Pectoralis muscle strain 04/22/2015   Tendinitis of forearm 04/22/2015   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 04/16/2012   Dizziness 01/25/2011   Urinary frequency 01/25/2011   Bilateral leg edema 01/25/2011   Encounter for well adult exam with abnormal findings 01/02/2011   HEMATOCHEZIA 12/18/2009   WRIST PAIN, RIGHT 12/09/2009   ANKLE PAIN, RIGHT 12/09/2009   POLYCYTHEMIA 05/20/2009   RASH-NONVESICULAR  09/04/2008   ANKLE SPRAIN, LEFT 05/31/2008   Palpitations 01/25/2008   VENEREAL WART 05/15/2007   SMOKER 05/15/2007   BACK PAIN 05/15/2007   INSOMNIA-SLEEP DISORDER-UNSPEC 05/15/2007   Attention deficit disorder 10/20/2006   GERD 10/20/2006   PSORIASIS 10/20/2006    PCP: Oliver Barre, MD  REFERRING PROVIDER: Julieanne Cotton, PA-C  REFERRING DIAG: L proximal humerus fx and dislocation, s/p ORIF  THERAPY DIAG:  No diagnosis found.  Rationale for Evaluation and Treatment: Rehabilitation  ONSET DATE: 10/08/22  NEXT MD VISIT: 12/01/22   SUBJECTIVE:  SUBJECTIVE STATEMENT: Pt reports orthopedist will be performing arthroscopic surgery to release tendons in one month if his ER motion  L shoulder doesn't improve.  Orthopedist wants pt to be more aggressive with stretching now. Hand dominance: Right  PERTINENT HISTORY: Open reduction and internal fixation of left proximal humerus fracture.   PAIN:  Are you having pain? Yes: NPRS scale: 3 /10 Pain location: L neck and shoulder into scapula  Pain description: stiff, aching, worse in am, when wakes up Aggravating factors: lying down Relieving factors: oxycodone, muscle relaxer  PRECAUTIONS: Other: As of 11/03/22 ortho PA notes: He is okay for full passive range of motion and okay to start active assisted range of motion.  No full active range of motion or rotator cuff strengthening exercises until cleared at his next appointment.  Follow-up in 4 weeks for clinical recheck with Dr. August Saucer.  RED FLAGS: None   WEIGHT BEARING RESTRICTIONS: Yes NWB L UE  FALLS:  Has patient fallen in last 6 months? Yes. Number of falls 1  LIVING ENVIRONMENT: Lives with: lives with their family Lives in: House/apartment Stairs: Yes: Internal: 11 steps; on left going up and  External: 3 steps; on right going up Has following equipment at home: None  OCCUPATION: Multimedia programmer, on computer  PLOF: Independent  PATIENT GOALS:to be able to participate in golfing, activities, with family, use L arm for dressing, bathing, I   OBJECTIVE:   DIAGNOSTIC FINDINGS:  Images available in epic chart review. Has plate and 8 screws L prox humerus  PATIENT SURVEYS:  Quick Dash 79.5%  COGNITION: Overall cognitive status: Within functional limits for tasks assessed     SENSATION: WFL  POSTURE: Soft sling L shoulder. Rounding L shoulder  UPPER EXTREMITY ROM: PROM L shoulder flexion 70, abd 80, ER -18 R shoulder wfl  Passive ROM Left eval L 11/29/22 AAROM with wand  Shoulder flexion 70 105  Shoulder extension    Shoulder abduction 80 115  Shoulder adduction    Shoulder internal rotation    Shoulder external rotation -18 10  Elbow flexion    Elbow extension    Wrist flexion    Wrist extension    Wrist ulnar deviation    Wrist radial deviation    Wrist pronation    Wrist supination     (Blank rows = not tested)  UPPER EXTREMITY MMT: L UE MMT not performed due to recent injury , restrictions. Did note active contraction of all shoulder musculature and elbow, wrist, hand musculature  R hand and wrist deficits with grasp strength due to previous traumatic injury  SHOULDER SPECIAL TESTS: NA due to orthopedic restrictions   JOINT MOBILITY TESTING:  na  PALPATION:  Mild undulation L lateral upper arm mild edema L upper arm    TODAY'S TREATMENT:  DATE: 12/14/22:  UBE, lowest resistance, x 2 1/2 F, 2/1/2 B Supine for manual stretching, AAROM by PT for ER, flex, all planes, overpressure end range. Supine serratus punches 20x Supine L shoulder ER , stretch with green t band, then concentric pull into IR , multiple  reps Supine red t band ER , short bursts, low amplitude, high speed with control emphasized, mass practice to fatigue Side lying R for L shoulder abduction, with therapist applying medial scapular glide to isolate inferior axilla Sidelying scapular punches to ceiling, 15 x Side lying on R for open books, therapist assisting  Standing wall slides with hand in pillow case into flex, scaption Standing B shoulder forward flexion hands in pillow case to engage scapular retractors Shoulder pulley 5 min Wall push ups 10 x   Moist heat L shoulder after session , 10 min 12/02/22:  Pulleys 5 min Seated with towel under L axilla for wand ER, 10x  Various supine stretching therex, performed with intermittent light GH distraction and shaking to relieve pain Supine for therapist assisted L serratus punches Supine for therapist assisted L Pec flys to stretch L ant chest Supine sustained stretch into ER with red theraband in palm, tolerated 1 1/2 min R Side lying for scapular mobs , lat glides Side lying therapist assisted punches to ceiling  R side lying shoulder abduction stretch R sidelying for L shoulder ER assisted.  Supine thoracic spine stretch with 1/2 foam roller along thoracic spine to open L ant chest wall  Moist heat ant and post shoulder at end of session, not billed  11/29/22 Seated swiffer AAROM slides into flexion x 15; scaption x 15 Pulleys x 3 min flexion; x 3 min scaption Supine wand flexion x 10  Supine wand ER x 10  Measured ROM  Vasopnuematic: game ready 10 min at end of session L shoulder in sitting. 11/25/22 Therapeutic exercise: to improve flexibility L shoulder Supine PROM/ AAROM L shoulder mass practice  Added supine cane ER 5 sec holds, 10 x Added supine cane scapular punches therapist with light support L arm to maintain vertical position Supine cane flexion assisted from 90 to 100, 5 reps R side lying for scapular clocks, therapist providing tactile cues with  hands Standing table slides with basketball on counter flex, scaption 10x Kinesiotape L shoulder, 2- I pieces , one from mid delts to middle traps, one from lateral delts to L upper traps, 35% pull  Vasopnuematic: game ready 10 min at end of session L shoulder in sitting.  11/22/22 MANUAL THERAPY: To promote normalized muscle tension, improved flexibility, improved joint mobility, increased ROM, and reduced pain.  STM/DTM and manual TPR to L UT, LS, rhomboids, middle & lower traps, lats  THERAPEUTIC EXERCISE: to improve flexibility, strength and mobility.  Demonstration, verbal and tactile cues throughout for technique. Pulleys: Flexion x 3 min Seated L AA flexion with swiffer x 10 Seated L AA scaption with swiffer x 10 Seated L AA circles with swiffer x 10  PROM to L shoulder MODALITIES: Game Ready vasopneumatic compression post session to L shoulder x 10 min, low compression, 34 to reduce post-exercise pain and swelling/edema  11/15/22 THERAPEUTIC EXERCISE: to improve flexibility, strength and mobility.  Demonstration, verbal and tactile cues throughout for technique. Pulleys: Flexion x 3 min B rhomboid (lower cervical/upper thoracic) stretch 3 x 30" - hands supported on Swiffer handle L UT stretch 3 x 30" L LS stretch 3 x 30" L shoulder flexion AAROM - holding onto [employer]  handle (as UE ranger) x 10 L shoulder scaption AAROM - holding onto Swiffer handle (as UE ranger) x 10 L shoulder pendulums - flexion/extension, horiz ABD/ADD, CW/CCW x 30" each   MANUAL THERAPY: To promote normalized muscle tension, improved flexibility, improved joint mobility, increased ROM, and reduced pain.  STM/DTM and manual TPR to L UT, LS, rhomboids, middle & lower traps, infraspinatus L shoulder gentle distraction +/- CW/CCW oscillations to promote muscle relaxation L shoulder PROM to tolerance in supine  MODALITIES: Game Ready vasopneumatic compression post session to L shoulder x 10 min, low  compression, 34 to reduce post-exercise pain and swelling/edema   11/11/22  Therapeutic Exercise: to improve strength and mobility.  Demo, verbal and tactile cues throughout for technique.  Table slides into flexion x 10  Table slides scaption x 10  Seated L UT 2x30 sec Seated L LS 2x30 sec Seated scap retraction 10x3" PROM to L shoulder flexion and scaption Manual Therapy: STM to L UT, LS, rhomboids  Vaso to L shoulder for edema 34 deg ;low compression x 10 min   11/09/22 Therapeutic Exercise: to improve strength and mobility.  Demo, verbal and tactile cues throughout for technique.  PROM within MD precautions Putty squeezes for hand and wrist x 10  Seated L UT stretch 2x30 sec Seated L LS stretch 2x30 sec Seated thoracic extension 10x3" Seated table slides into flexion PROM x 10    PATIENT EDUCATION: Education details: HEP review and HEP modification - use of Swiffer as alternative for table slides Person educated: Patient and Spouse Education method: Explanation, Demonstration, Tactile cues, and Verbal cues Education comprehension: verbalized understanding and returned demonstration  HOME EXERCISE PROGRAM: Access Code: [email or web address] URL: [email or web address] Date: 11/11/2022 Prepared by: Verta Ellen  Exercises - Seated Upper Trapezius Stretch  - 2 x daily - 7 x weekly - 2 sets - 2 reps - 30 sec hold - Gentle Levator Scapulae Stretch  - 2 x daily - 7 x weekly - 2 sets - 2 reps - 30 sec hold - Seated Thoracic Extension AROM  - 1 x daily - 7 x weekly - 2 sets - 10 reps - Seated Shoulder Flexion Towel Slide at Table Top  - 1 x daily - 7 x weekly - 2 sets - 10 reps - Seated Shoulder Scaption Slide at Table Top with Forearm in Neutral  - 1 x daily - 7 x weekly - 2 sets - 10 reps - Seated Scapular Retraction  - 1 x daily - 7 x weekly - 2 sets - 10 reps   ASSESSMENT:  CLINICAL IMPRESSION: Continued with AAROM exercises to improve functional use of L shoulder.  He demonstrates improved AAROM of L shoulder with wand according to measurements. He is very limited with ER using the wand. [place] will benefit from continued skilled PT to address above deficits to improve mobility and activity tolerance with decreased pain interference.  OBJECTIVE IMPAIRMENTS: decreased activity tolerance, decreased ROM, decreased strength, increased edema, increased fascial restrictions, impaired flexibility, and pain.   ACTIVITY LIMITATIONS: carrying, lifting, sleeping, bathing, toileting, dressing, and reach over head  PARTICIPATION LIMITATIONS: meal prep, cleaning, laundry, driving, shopping, community activity, and yard work  PERSONAL FACTORS: Fitness, Past/current experiences, Time since onset of injury/illness/exacerbation, Transportation, and 1-2 comorbidities: psoriasis, LBP  are also affecting patient's functional outcome.   REHAB POTENTIAL: Good  CLINICAL DECISION MAKING: Stable/uncomplicated  EVALUATION COMPLEXITY: Low   GOALS: Goals reviewed with patient? Yes  SHORT TERM GOALS: Target date: 4 weeks, 12/02/22  ROM L shoulder flex , abd 90, ER 30 Baseline:flex 70, abd 80, Er -18 Goal status: IN PROGRESS- met for flexion- 11/29/22  LONG TERM GOALS: Target date: 12 weeks: 01/27/23  I HEP Baseline: TBD Goal status: Met for current HEP- 11/22/22  2.  L shoulder ROM for elevation 130 or greater for functional reach, ADL's Baseline:  Goal status: IN PROGRESS  3.  L shoulder ER 60 or greater for functional reach, IR hand behind back to L 1 for dressing, bathing  Baseline:  Goal status: IN PROGRESS  4.  Quick DASH L shoulder 25% or less Baseline: 79.5% disability Goal status: IN PROGRESS  5.  Strength L shoulder 4+/5 all planes for ability to play golf, perform yard work, lifting Baseline:  Goal status: IN PROGRESS   PLAN:  PT FREQUENCY: 2x/week  PT DURATION: 12 weeks  PLANNED INTERVENTIONS: Therapeutic exercises, Therapeutic activity,  Neuromuscular re-education, Balance training, Gait training, Patient/Family education, Self Care, and Joint mobilization  PLAN FOR NEXT SESSION: continue PROM and AAROM , try to engage with Er as much as pt can tolerate   Keric Zehren L Chayah Mckee, PT, DPT, OCS 12/14/2022, 10:30 AM

## 2022-12-16 ENCOUNTER — Ambulatory Visit: Payer: BC Managed Care – PPO

## 2022-12-16 DIAGNOSIS — M25512 Pain in left shoulder: Secondary | ICD-10-CM

## 2022-12-16 DIAGNOSIS — Z8781 Personal history of (healed) traumatic fracture: Secondary | ICD-10-CM

## 2022-12-16 DIAGNOSIS — M6281 Muscle weakness (generalized): Secondary | ICD-10-CM

## 2022-12-16 DIAGNOSIS — M25612 Stiffness of left shoulder, not elsewhere classified: Secondary | ICD-10-CM

## 2022-12-16 NOTE — Therapy (Signed)
OUTPATIENT PHYSICAL THERAPY TREATMENT   Patient Name: Isaiah Gibson MRN: [id number] DOB:1985/09/02, 37 y.o., male Today's Date: 12/16/2022  END OF SESSION:  PT End of Session - 12/16/22 0943     Visit Number 10    Date for PT Re-Evaluation 01/27/23    Progress Note Due on Visit 10    PT Start Time 0935    PT Stop Time 1015    PT Time Calculation (min) 40 min                      Past Medical History:  Diagnosis Date   ADD 10/20/2006   BACK PAIN 05/15/2007   GERD 10/20/2006   HEMATOCHEZIA 12/18/2009   INSOMNIA-SLEEP DISORDER-UNSPEC 05/15/2007   Palpitations 01/25/2008   POLYCYTHEMIA 05/20/2009   PSORIASIS 10/20/2006   SMOKER 05/15/2007   VENEREAL WART 05/15/2007   Past Surgical History:  Procedure Laterality Date   HAND SURGERY Right    ORIF SHOULDER FRACTURE Left 10/08/2022   Procedure: OPEN REDUCTION INTERNAL FIXATION (ORIF) GREATER TUBEROSITY FRACTURE;  Surgeon: Cammy Copa, MD;  Location: [facility] OR;  Service: Orthopedics;  Laterality: Left;  HANDY BED   WISDOM TOOTH EXTRACTION     Patient Active Problem List   Diagnosis Date Noted   Closed fracture dislocation of shoulder with routine healing 10/09/2022   Obesity (BMI 35.0-39.9 without comorbidity) 07/22/2022   Vitamin D deficiency 07/22/2022   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   Hematospermia 09/11/2020   Fatigue 09/27/2019   Erectile dysfunction 09/27/2019   Upper respiratory tract infection 09/27/2019   Screening-pulmonary TB 04/23/2017   Anxiety 04/14/2016   Panic attacks 04/14/2016   Weight gain 11/26/2015   Former smoker 11/26/2015   Pectoralis muscle strain 04/22/2015   Tendinitis of forearm 04/22/2015   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 04/16/2012   Dizziness 01/25/2011   Urinary frequency 01/25/2011   Bilateral leg edema 01/25/2011   Encounter for well adult exam with abnormal findings 01/02/2011   HEMATOCHEZIA 12/18/2009   WRIST PAIN, RIGHT 12/09/2009   ANKLE PAIN,  RIGHT 12/09/2009   POLYCYTHEMIA 05/20/2009   RASH-NONVESICULAR 09/04/2008   ANKLE SPRAIN, LEFT 05/31/2008   Palpitations 01/25/2008   VENEREAL WART 05/15/2007   SMOKER 05/15/2007   BACK PAIN 05/15/2007   INSOMNIA-SLEEP DISORDER-UNSPEC 05/15/2007   Attention deficit disorder 10/20/2006   GERD 10/20/2006   PSORIASIS 10/20/2006    PCP: Oliver Barre, MD  REFERRING PROVIDER: Julieanne Cotton, PA-C  REFERRING DIAG: L proximal humerus fx and dislocation, s/p ORIF  THERAPY DIAG:  History of left shoulder fracture  Stiffness of left shoulder, not elsewhere classified  Acute pain of left shoulder  Muscle weakness of left arm  Rationale for Evaluation and Treatment: Rehabilitation  ONSET DATE: 10/08/22  NEXT MD VISIT: 12/01/22   SUBJECTIVE:  SUBJECTIVE STATEMENT: Pt reports no pain just tightness. Hand dominance: Right  PERTINENT HISTORY: Open reduction and internal fixation of left proximal humerus fracture.   PAIN:  Are you having pain? Yes: NPRS scale: 0/10 Pain location: L neck and shoulder into scapula  Pain description: stiff, aching, worse in am, when wakes up Aggravating factors: lying down Relieving factors: oxycodone, muscle relaxer  PRECAUTIONS: Other: As of 11/03/22 ortho PA notes: He is okay for full passive range of motion and okay to start active assisted range of motion.  No full active range of motion or rotator cuff strengthening exercises until cleared at his next appointment.  Follow-up in 4 weeks for clinical recheck with Dr. August Saucer.  RED FLAGS: None   WEIGHT BEARING RESTRICTIONS: Yes NWB L UE  FALLS:  Has patient fallen in last 6 months? Yes. Number of falls 1  LIVING ENVIRONMENT: Lives with: lives with their family Lives in: House/apartment Stairs: Yes: Internal: 11  steps; on left going up and External: 3 steps; on right going up Has following equipment at home: None  OCCUPATION: Multimedia programmer, on computer  PLOF: Independent  PATIENT GOALS:to be able to participate in golfing, activities, with family, use L arm for dressing, bathing, I   OBJECTIVE:   DIAGNOSTIC FINDINGS:  Images available in epic chart review. Has plate and 8 screws L prox humerus  PATIENT SURVEYS:  Quick Dash 79.5%  COGNITION: Overall cognitive status: Within functional limits for tasks assessed     SENSATION: WFL  POSTURE: Soft sling L shoulder. Rounding L shoulder  UPPER EXTREMITY ROM: PROM L shoulder flexion 70, abd 80, ER -18 R shoulder wfl  Passive ROM Left eval L 11/29/22 AAROM with wand L 12/16/22   Shoulder flexion 70 105 122  Shoulder extension     Shoulder abduction 80 115 130  Shoulder adduction     Shoulder internal rotation     Shoulder external rotation -18 10 17   Elbow flexion     Elbow extension     Wrist flexion     Wrist extension     Wrist ulnar deviation     Wrist radial deviation     Wrist pronation     Wrist supination      (Blank rows = not tested)  UPPER EXTREMITY MMT: L UE MMT not performed due to recent injury , restrictions. Did note active contraction of all shoulder musculature and elbow, wrist, hand musculature  R hand and wrist deficits with grasp strength due to previous traumatic injury  SHOULDER SPECIAL TESTS: NA due to orthopedic restrictions   JOINT MOBILITY TESTING:  na  PALPATION:  Mild undulation L lateral upper arm mild edema L upper arm    TODAY'S TREATMENT:  DATE: 12/16/22 Pulleys: 3 min flexion, 3 min scaption Standing L shoulder ER with wand x 10 Standing isometric shoulder IR YTB asistaning into ER 10x5" Passive stretching into ER with prolonged holds Contract  and relax into ER  Supine L shoulder ER with wand towel under elbow S/L L shoulder horizontal ABD x 10   12/14/22:  UBE, lowest resistance, x 2 1/2 F, 2/1/2 B Supine for manual stretching, AAROM by PT for ER, flex, all planes, overpressure end range. Supine serratus punches 20x Supine L shoulder ER , stretch with green t band, then concentric pull into IR , multiple reps Supine red t band ER , short bursts, low amplitude, high speed with control emphasized, mass practice to fatigue Side lying R for L shoulder abduction, with therapist applying medial scapular glide to isolate inferior axilla Sidelying scapular punches to ceiling, 15 x Side lying on R for open books, therapist assisting  Standing wall slides with hand in pillow case into flex, scaption Standing B shoulder forward flexion hands in pillow case to engage scapular retractors Shoulder pulley 5 min Wall push ups 10 x   Moist heat L shoulder after session , 10 min 12/02/22:  Pulleys 5 min Seated with towel under L axilla for wand ER, 10x  Various supine stretching therex, performed with intermittent light GH distraction and shaking to relieve pain Supine for therapist assisted L serratus punches Supine for therapist assisted L Pec flys to stretch L ant chest Supine sustained stretch into ER with red theraband in palm, tolerated 1 1/2 min R Side lying for scapular mobs , lat glides Side lying therapist assisted punches to ceiling  R side lying shoulder abduction stretch R sidelying for L shoulder ER assisted.  Supine thoracic spine stretch with 1/2 foam roller along thoracic spine to open L ant chest wall  Moist heat ant and post shoulder at end of session, not billed  11/29/22 Seated swiffer AAROM slides into flexion x 15; scaption x 15 Pulleys x 3 min flexion; x 3 min scaption Supine wand flexion x 10  Supine wand ER x 10  Measured ROM  Vasopnuematic: game ready 10 min at end of session L shoulder in  sitting. 11/25/22 Therapeutic exercise: to improve flexibility L shoulder Supine PROM/ AAROM L shoulder mass practice  Added supine cane ER 5 sec holds, 10 x Added supine cane scapular punches therapist with light support L arm to maintain vertical position Supine cane flexion assisted from 90 to 100, 5 reps R side lying for scapular clocks, therapist providing tactile cues with hands Standing table slides with basketball on counter flex, scaption 10x Kinesiotape L shoulder, 2- I pieces , one from mid delts to middle traps, one from lateral delts to L upper traps, 35% pull  Vasopnuematic: game ready 10 min at end of session L shoulder in sitting.  11/22/22 MANUAL THERAPY: To promote normalized muscle tension, improved flexibility, improved joint mobility, increased ROM, and reduced pain.  STM/DTM and manual TPR to L UT, LS, rhomboids, middle & lower traps, lats  THERAPEUTIC EXERCISE: to improve flexibility, strength and mobility.  Demonstration, verbal and tactile cues throughout for technique. Pulleys: Flexion x 3 min Seated L AA flexion with swiffer x 10 Seated L AA scaption with swiffer x 10 Seated L AA circles with swiffer x 10  PROM to L shoulder MODALITIES: Game Ready vasopneumatic compression post session to L shoulder x 10 min, low compression, 34 to reduce post-exercise pain and swelling/edema  11/15/22 THERAPEUTIC  EXERCISE: to improve flexibility, strength and mobility.  Demonstration, verbal and tactile cues throughout for technique. Pulleys: Flexion x 3 min B rhomboid (lower cervical/upper thoracic) stretch 3 x 30" - hands supported on Swiffer handle L UT stretch 3 x 30" L LS stretch 3 x 30" L shoulder flexion AAROM - holding onto Swiffer handle (as UE ranger) x 10 L shoulder scaption AAROM - holding onto Swiffer handle (as UE ranger) x 10 L shoulder pendulums - flexion/extension, horiz ABD/ADD, CW/CCW x 30" each   MANUAL THERAPY: To promote normalized muscle tension,  improved flexibility, improved joint mobility, increased ROM, and reduced pain.  STM/DTM and manual TPR to L UT, LS, rhomboids, middle & lower traps, infraspinatus L shoulder gentle distraction +/- CW/CCW oscillations to promote muscle relaxation L shoulder PROM to tolerance in supine  MODALITIES: Game Ready vasopneumatic compression post session to L shoulder x 10 min, low compression, 34 to reduce post-exercise pain and swelling/edema   11/11/22  Therapeutic Exercise: to improve strength and mobility.  Gibson, verbal and tactile cues throughout for technique.  Table slides into flexion x 10  Table slides scaption x 10  Seated L UT 2x30 sec Seated L LS 2x30 sec Seated scap retraction 10x3" PROM to L shoulder flexion and scaption Manual Therapy: STM to L UT, LS, rhomboids  Vaso to L shoulder for edema 34 deg ;low compression x 10 min   11/09/22 Therapeutic Exercise: to improve strength and mobility.  Gibson, verbal and tactile cues throughout for technique.  PROM within MD precautions Putty squeezes for hand and wrist x 10  Seated L UT stretch 2x30 sec Seated L LS stretch 2x30 sec Seated thoracic extension 10x3" Seated table slides into flexion PROM x 10    PATIENT EDUCATION: Education details: HEP review and HEP modification - use of Swiffer as alternative for table slides Person educated: Patient and Spouse Education method: Explanation, Demonstration, Tactile cues, and Verbal cues Education comprehension: verbalized understanding and returned demonstration  HOME EXERCISE PROGRAM: Access Code: [email or web address] URL: [email or web address] Date: 11/11/2022 Prepared by: Verta Ellen  Exercises - Seated Upper Trapezius Stretch  - 2 x daily - 7 x weekly - 2 sets - 2 reps - 30 sec hold - Gentle Levator Scapulae Stretch  - 2 x daily - 7 x weekly - 2 sets - 2 reps - 30 sec hold - Seated Thoracic Extension AROM  - 1 x daily - 7 x weekly - 2 sets - 10 reps - Seated Shoulder  Flexion Towel Slide at Table Top  - 1 x daily - 7 x weekly - 2 sets - 10 reps - Seated Shoulder Scaption Slide at Table Top with Forearm in Neutral  - 1 x daily - 7 x weekly - 2 sets - 10 reps - Seated Scapular Retraction  - 1 x daily - 7 x weekly - 2 sets - 10 reps   ASSESSMENT:  CLINICAL IMPRESSION: Continued with AAROM exercises and stretching to improve functional ER of L shoulder. He demonstrates some improved ROM of L shoulder, he has a hard time isolating ER. Does show 7 points of improvement today compared to last measurements. He is progressing with his goals, continues to need work on stretching for ER. Isaiah Gibson will benefit from continued skilled PT to address above deficits to improve mobility and activity tolerance with decreased pain interference.  OBJECTIVE IMPAIRMENTS: decreased activity tolerance, decreased ROM, decreased strength, increased edema, increased fascial restrictions, impaired flexibility, and pain.   ACTIVITY LIMITATIONS: carrying, lifting,  sleeping, bathing, toileting, dressing, and reach over head  PARTICIPATION LIMITATIONS: meal prep, cleaning, laundry, driving, shopping, community activity, and yard work  PERSONAL FACTORS: Fitness, Past/current experiences, Time since onset of injury/illness/exacerbation, Transportation, and 1-2 comorbidities: psoriasis, LBP  are also affecting patient's functional outcome.   REHAB POTENTIAL: Good  CLINICAL DECISION MAKING: Stable/uncomplicated  EVALUATION COMPLEXITY: Low   GOALS: Goals reviewed with patient? Yes  SHORT TERM GOALS: Target date: 4 weeks, 12/02/22  ROM L shoulder flex , abd 90, ER 30 Baseline:flex 70, abd 80, Er -18 Goal status: IN PROGRESS- met for flexion- 11/29/22  LONG TERM GOALS: Target date: 12 weeks: 01/27/23  I HEP Baseline: TBD Goal status: Met for current HEP- 11/22/22  2.  L shoulder ROM for elevation 130 or greater for functional reach, ADL's Baseline:  Goal status: IN PROGRESS-  12/16/22  3.  L shoulder ER 60 or greater for functional reach, IR hand behind back to L 1 for dressing, bathing  Baseline:  Goal status: IN PROGRESS- 12/16/22  4.  Quick DASH L shoulder 25% or less Baseline: 79.5% disability Goal status: IN PROGRESS  5.  Strength L shoulder 4+/5 all planes for ability to play golf, perform yard work, lifting Baseline:  Goal status: IN PROGRESS   PLAN:  PT FREQUENCY: 2x/week  PT DURATION: 12 weeks  PLANNED INTERVENTIONS: Therapeutic exercises, Therapeutic activity, Neuromuscular re-education, Balance training, Gait training, Patient/Family education, Self Care, and Joint mobilization  PLAN FOR NEXT SESSION: continue PROM and AAROM , try to engage with Er as much as pt can tolerate   Darleene Cleaver, PTA 12/16/2022, 10:45 AM

## 2022-12-21 ENCOUNTER — Ambulatory Visit: Payer: BC Managed Care – PPO

## 2022-12-21 ENCOUNTER — Other Ambulatory Visit: Payer: Self-pay

## 2022-12-21 DIAGNOSIS — M6281 Muscle weakness (generalized): Secondary | ICD-10-CM

## 2022-12-21 DIAGNOSIS — M25612 Stiffness of left shoulder, not elsewhere classified: Secondary | ICD-10-CM

## 2022-12-21 DIAGNOSIS — M25512 Pain in left shoulder: Secondary | ICD-10-CM

## 2022-12-21 DIAGNOSIS — Z8781 Personal history of (healed) traumatic fracture: Secondary | ICD-10-CM | POA: Diagnosis not present

## 2022-12-21 NOTE — Therapy (Signed)
OUTPATIENT PHYSICAL THERAPY TREATMENT   Patient Name: Isaiah Gibson MRN: [id number] DOB:06/10/1985, 37 y.o., male Today's Date: 12/21/2022  END OF SESSION:  PT End of Session - 12/21/22 1159     Visit Number 11    Date for PT Re-Evaluation 01/27/23    Progress Note Due on Visit 10                       Past Medical History:  Diagnosis Date   ADD 10/20/2006   BACK PAIN 05/15/2007   GERD 10/20/2006   HEMATOCHEZIA 12/18/2009   INSOMNIA-SLEEP DISORDER-UNSPEC 05/15/2007   Palpitations 01/25/2008   POLYCYTHEMIA 05/20/2009   PSORIASIS 10/20/2006   SMOKER 05/15/2007   VENEREAL WART 05/15/2007   Past Surgical History:  Procedure Laterality Date   HAND SURGERY Right    ORIF SHOULDER FRACTURE Left 10/08/2022   Procedure: OPEN REDUCTION INTERNAL FIXATION (ORIF) GREATER TUBEROSITY FRACTURE;  Surgeon: Cammy Copa, MD;  Location: [facility] OR;  Service: Orthopedics;  Laterality: Left;  HANDY BED   WISDOM TOOTH EXTRACTION     Patient Active Problem List   Diagnosis Date Noted   Closed fracture dislocation of shoulder with routine healing 10/09/2022   Obesity (BMI 35.0-39.9 without comorbidity) 07/22/2022   Vitamin D deficiency 07/22/2022   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   Hematospermia 09/11/2020   Fatigue 09/27/2019   Erectile dysfunction 09/27/2019   Upper respiratory tract infection 09/27/2019   Screening-pulmonary TB 04/23/2017   Anxiety 04/14/2016   Panic attacks 04/14/2016   Weight gain 11/26/2015   Former smoker 11/26/2015   Pectoralis muscle strain 04/22/2015   Tendinitis of forearm 04/22/2015   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 04/16/2012   Dizziness 01/25/2011   Urinary frequency 01/25/2011   Bilateral leg edema 01/25/2011   Encounter for well adult exam with abnormal findings 01/02/2011   HEMATOCHEZIA 12/18/2009   WRIST PAIN, RIGHT 12/09/2009   ANKLE PAIN, RIGHT 12/09/2009   POLYCYTHEMIA 05/20/2009   RASH-NONVESICULAR 09/04/2008   ANKLE  SPRAIN, LEFT 05/31/2008   Palpitations 01/25/2008   VENEREAL WART 05/15/2007   SMOKER 05/15/2007   BACK PAIN 05/15/2007   INSOMNIA-SLEEP DISORDER-UNSPEC 05/15/2007   Attention deficit disorder 10/20/2006   GERD 10/20/2006   PSORIASIS 10/20/2006    PCP: Oliver Barre, MD  REFERRING PROVIDER: Julieanne Cotton, PA-C  REFERRING DIAG: L proximal humerus fx and dislocation, s/p ORIF  THERAPY DIAG:  No diagnosis found.  Rationale for Evaluation and Treatment: Rehabilitation  ONSET DATE: 10/08/22  NEXT MD VISIT: 12/01/22   SUBJECTIVE:  SUBJECTIVE STATEMENT: Pt reports no pain just tightness, helped a friend move some debris in his yard yesterday, fatigued Hand dominance: Right  PERTINENT HISTORY: Open reduction and internal fixation of left proximal humerus fracture.   PAIN:  Are you having pain? Yes: NPRS scale: 0/10 Pain location: L neck and shoulder into scapula  Pain description: stiff, aching, worse in am, when wakes up Aggravating factors: lying down Relieving factors: oxycodone, muscle relaxer  PRECAUTIONS: Other: As of 11/03/22 ortho PA notes: He is okay for full passive range of motion and okay to start active assisted range of motion.  No full active range of motion or rotator cuff strengthening exercises until cleared at his next appointment.  Follow-up in 4 weeks for clinical recheck with Dr. August Saucer.  RED FLAGS: None   WEIGHT BEARING RESTRICTIONS: Yes NWB L UE  FALLS:  Has patient fallen in last 6 months? Yes. Number of falls 1  LIVING ENVIRONMENT: Lives with: lives with their family Lives in: House/apartment Stairs: Yes: Internal: 11 steps; on left going up and External: 3 steps; on right going up Has following equipment at home: None  OCCUPATION: Multimedia programmer, on  computer  PLOF: Independent  PATIENT GOALS:to be able to participate in golfing, activities, with family, use L arm for dressing, bathing, I   OBJECTIVE:   DIAGNOSTIC FINDINGS:  Images available in epic chart review. Has plate and 8 screws L prox humerus  PATIENT SURVEYS:  Quick Dash 79.5%  COGNITION: Overall cognitive status: Within functional limits for tasks assessed     SENSATION: WFL  POSTURE: Soft sling L shoulder. Rounding L shoulder  UPPER EXTREMITY ROM: PROM L shoulder flexion 70, abd 80, ER -18 R shoulder wfl  Passive ROM Left eval L 11/29/22 AAROM with wand L 12/16/22  L AROM   Shoulder flexion 70 105 122 130  Shoulder extension      Shoulder abduction 80 115 130 112  Shoulder adduction      Shoulder internal rotation    L PSIS  Shoulder external rotation -18 10 17  L habd behind base of neck  Elbow flexion      Elbow extension      Wrist flexion      Wrist extension      Wrist ulnar deviation      Wrist radial deviation      Wrist pronation      Wrist supination       (Blank rows = not tested)  UPPER EXTREMITY MMT: L UE MMT not performed due to recent injury , restrictions. Did note active contraction of all shoulder musculature and elbow, wrist, hand musculature  R hand and wrist deficits with grasp strength due to previous traumatic injury  SHOULDER SPECIAL TESTS: NA due to orthopedic restrictions   JOINT MOBILITY TESTING:  na  PALPATION:  Mild undulation L lateral upper arm mild edema L upper arm    TODAY'S TREATMENT:  DATE: 12/21/22: Therapeutic exercise and manual techniques, combined to improve L GH and scapulothoracic jt mobility and inferior, post capsular restrictions: UBE L2 2.72F ,2.5B Semi reclined for inferior glides L GH jt x 60 sec bouts x 3 Manual stretching in supine L shoulder all planes,  stretching L shoulder ER with humerus adducted, at 60 degrees abd and at 90 degrees abd, therapist provided inferior glide L scapula to prevent excessive firing L upper traps multiple reps Supine isometric shoulder abd sustained with B shoulder flexion 15 reps Supine fast B shoulder ER with red t band 30x Side lying R  for thoracic open books, therapist providing lateral pressure scapula to isolate stretch post capsule Side lying L shoulder abd with distraction of L shoulder/ GH jt   Moist heat L shoulder after session for post ex soreness 8 min   12/16/22 Pulleys: 3 min flexion, 3 min scaption Standing L shoulder ER with wand x 10 Standing isometric shoulder IR YTB asistaning into ER 10x5" Passive stretching into ER with prolonged holds Contract and relax into ER  Supine L shoulder ER with wand towel under elbow S/L L shoulder horizontal ABD x 10   12/14/22:  UBE, lowest resistance, x 2 1/2 F, 2/1/2 B Supine for manual stretching, AAROM by PT for ER, flex, all planes, overpressure end range. Supine serratus punches 20x Supine L shoulder ER , stretch with green t band, then concentric pull into IR , multiple reps Supine red t band ER , short bursts, low amplitude, high speed with control emphasized, mass practice to fatigue Side lying R for L shoulder abduction, with therapist applying medial scapular glide to isolate inferior axilla Sidelying scapular punches to ceiling, 15 x Side lying on R for open books, therapist assisting  Standing wall slides with hand in pillow case into flex, scaption Standing B shoulder forward flexion hands in pillow case to engage scapular retractors Shoulder pulley 5 min Wall push ups 10 x   Moist heat L shoulder after session , 10 min 12/02/22:  Pulleys 5 min Seated with towel under L axilla for wand ER, 10x  Various supine stretching therex, performed with intermittent light GH distraction and shaking to relieve pain Supine for therapist assisted  L serratus punches Supine for therapist assisted L Pec flys to stretch L ant chest Supine sustained stretch into ER with red theraband in palm, tolerated 1 1/2 min R Side lying for scapular mobs , lat glides Side lying therapist assisted punches to ceiling  R side lying shoulder abduction stretch R sidelying for L shoulder ER assisted.  Supine thoracic spine stretch with 1/2 foam roller along thoracic spine to open L ant chest wall  Moist heat ant and post shoulder at end of session, not billed  11/29/22 Seated swiffer AAROM slides into flexion x 15; scaption x 15 Pulleys x 3 min flexion; x 3 min scaption Supine wand flexion x 10  Supine wand ER x 10  Measured ROM  Vasopnuematic: game ready 10 min at end of session L shoulder in sitting. 11/25/22 Therapeutic exercise: to improve flexibility L shoulder Supine PROM/ AAROM L shoulder mass practice  Added supine cane ER 5 sec holds, 10 x Added supine cane scapular punches therapist with light support L arm to maintain vertical position Supine cane flexion assisted from 90 to 100, 5 reps R side lying for scapular clocks, therapist providing tactile cues with hands Standing table slides with basketball on counter flex, scaption 10x Kinesiotape L shoulder, 2- I  pieces , one from mid delts to middle traps, one from lateral delts to L upper traps, 35% pull  Vasopnuematic: game ready 10 min at end of session L shoulder in sitting.  11/22/22 MANUAL THERAPY: To promote normalized muscle tension, improved flexibility, improved joint mobility, increased ROM, and reduced pain.  STM/DTM and manual TPR to L UT, LS, rhomboids, middle & lower traps, lats  THERAPEUTIC EXERCISE: to improve flexibility, strength and mobility.  Demonstration, verbal and tactile cues throughout for technique. Pulleys: Flexion x 3 min Seated L AA flexion with swiffer x 10 Seated L AA scaption with swiffer x 10 Seated L AA circles with swiffer x 10  PROM to L  shoulder MODALITIES: Game Ready vasopneumatic compression post session to L shoulder x 10 min, low compression, 34 to reduce post-exercise pain and swelling/edema  11/15/22 THERAPEUTIC EXERCISE: to improve flexibility, strength and mobility.  Demonstration, verbal and tactile cues throughout for technique. Pulleys: Flexion x 3 min B rhomboid (lower cervical/upper thoracic) stretch 3 x 30" - hands supported on Swiffer handle L UT stretch 3 x 30" L LS stretch 3 x 30" L shoulder flexion AAROM - holding onto Swiffer handle (as UE ranger) x 10 L shoulder scaption AAROM - holding onto Swiffer handle (as UE ranger) x 10 L shoulder pendulums - flexion/extension, horiz ABD/ADD, CW/CCW x 30" each   MANUAL THERAPY: To promote normalized muscle tension, improved flexibility, improved joint mobility, increased ROM, and reduced pain.  STM/DTM and manual TPR to L UT, LS, rhomboids, middle & lower traps, infraspinatus L shoulder gentle distraction +/- CW/CCW oscillations to promote muscle relaxation L shoulder PROM to tolerance in supine  MODALITIES: Game Ready vasopneumatic compression post session to L shoulder x 10 min, low compression, 34 to reduce post-exercise pain and swelling/edema   11/11/22  Therapeutic Exercise: to improve strength and mobility.  Demo, verbal and tactile cues throughout for technique.  Table slides into flexion x 10  Table slides scaption x 10  Seated L UT 2x30 sec Seated L LS 2x30 sec Seated scap retraction 10x3" PROM to L shoulder flexion and scaption Manual Therapy: STM to L UT, LS, rhomboids  Vaso to L shoulder for edema 34 deg ;low compression x 10 min   11/09/22 Therapeutic Exercise: to improve strength and mobility.  Demo, verbal and tactile cues throughout for technique.  PROM within MD precautions Putty squeezes for hand and wrist x 10  Seated L UT stretch 2x30 sec Seated L LS stretch 2x30 sec Seated thoracic extension 10x3" Seated table slides into  flexion PROM x 10    PATIENT EDUCATION: Education details: HEP review and HEP modification - use of Swiffer as alternative for table slides Person educated: Patient and Spouse Education method: Explanation, Demonstration, Tactile cues, and Verbal cues Education comprehension: verbalized understanding and returned demonstration  HOME EXERCISE PROGRAM: Access Code: [email or web address] URL: [email or web address] Date: 11/11/2022 Prepared by: Verta Ellen  Exercises - Seated Upper Trapezius Stretch  - 2 x daily - 7 x weekly - 2 sets - 2 reps - 30 sec hold - Gentle Levator Scapulae Stretch  - 2 x daily - 7 x weekly - 2 sets - 2 reps - 30 sec hold - Seated Thoracic Extension AROM  - 1 x daily - 7 x weekly - 2 sets - 10 reps - Seated Shoulder Flexion Towel Slide at Table Top  - 1 x daily - 7 x weekly - 2 sets - 10 reps - Seated Shoulder Scaption Slide at Table  Top with Forearm in Neutral  - 1 x daily - 7 x weekly - 2 sets - 10 reps - Seated Scapular Retraction  - 1 x daily - 7 x weekly - 2 sets - 10 reps   ASSESSMENT:  CLINICAL IMPRESSION: Continued with AAROM exercises and stretching to improve functional ER of L shoulder. He demonstrates much improved AROM of L shoulder, he has a hard time isolating ER, much better L shoulder ER with combined manual depression L scapula.  He is progressing with his goals, continues to need work on stretching for ER. Reviewed the wall push ups with him again today verbally.  Lb will benefit from continued skilled PT to address above deficits to improve mobility and activity tolerance with decreased pain interference.  OBJECTIVE IMPAIRMENTS: decreased activity tolerance, decreased ROM, decreased strength, increased edema, increased fascial restrictions, impaired flexibility, and pain.   ACTIVITY LIMITATIONS: carrying, lifting, sleeping, bathing, toileting, dressing, and reach over head  PARTICIPATION LIMITATIONS: meal prep, cleaning, laundry, driving,  shopping, community activity, and yard work  PERSONAL FACTORS: Fitness, Past/current experiences, Time since onset of injury/illness/exacerbation, Transportation, and 1-2 comorbidities: psoriasis, LBP  are also affecting patient's functional outcome.   REHAB POTENTIAL: Good  CLINICAL DECISION MAKING: Stable/uncomplicated  EVALUATION COMPLEXITY: Low   GOALS: Goals reviewed with patient? Yes  SHORT TERM GOALS: Target date: 4 weeks, 12/02/22  ROM L shoulder flex , abd 90, ER 30 Baseline:flex 70, abd 80, Er -18 Goal status: IN PROGRESS- met for flexion- 11/29/22  LONG TERM GOALS: Target date: 12 weeks: 01/27/23  I HEP Baseline: TBD Goal status: Met for current HEP- 11/22/22  2.  L shoulder ROM for elevation 130 or greater for functional reach, ADL's Baseline:  Goal status: IN PROGRESS- 12/16/22  3.  L shoulder ER 60 or greater for functional reach, IR hand behind back to L 1 for dressing, bathing  Baseline:  Goal status: IN PROGRESS- 12/16/22  4.  Quick DASH L shoulder 25% or less Baseline: 79.5% disability Goal status: IN PROGRESS  5.  Strength L shoulder 4+/5 all planes for ability to play golf, perform yard work, lifting Baseline:  Goal status: IN PROGRESS   PLAN:  PT FREQUENCY: 2x/week  PT DURATION: 12 weeks  PLANNED INTERVENTIONS: Therapeutic exercises, Therapeutic activity, Neuromuscular re-education, Balance training, Gait training, Patient/Family education, Self Care, and Joint mobilization  PLAN FOR NEXT SESSION: continue PROM and AAROM , try to engage with Er as much as pt can tolerate   Markeria Goetsch L Wynter Grave, PT, DPT, OCS 12/21/2022, 12:00 PM

## 2022-12-28 ENCOUNTER — Other Ambulatory Visit: Payer: Self-pay

## 2022-12-28 ENCOUNTER — Ambulatory Visit: Payer: BC Managed Care – PPO | Attending: Surgical

## 2022-12-28 DIAGNOSIS — M25512 Pain in left shoulder: Secondary | ICD-10-CM | POA: Insufficient documentation

## 2022-12-28 DIAGNOSIS — M6281 Muscle weakness (generalized): Secondary | ICD-10-CM | POA: Diagnosis present

## 2022-12-28 DIAGNOSIS — M25612 Stiffness of left shoulder, not elsewhere classified: Secondary | ICD-10-CM | POA: Diagnosis present

## 2022-12-28 DIAGNOSIS — Z8781 Personal history of (healed) traumatic fracture: Secondary | ICD-10-CM | POA: Insufficient documentation

## 2022-12-28 NOTE — Therapy (Signed)
OUTPATIENT PHYSICAL THERAPY TREATMENT   Patient Name: Isaiah Gibson MRN: [id number] DOB:1985-05-02, 37 y.o., male Today's Date: 12/28/2022  END OF SESSION:  PT End of Session - 12/28/22 1024     Visit Number 12    Date for PT Re-Evaluation 01/27/23    Progress Note Due on Visit 10    PT Start Time 1020    PT Stop Time 1100    PT Time Calculation (min) 40 min    Activity Tolerance Patient tolerated treatment well    Behavior During Therapy [facility] for tasks assessed/performed                Past Medical History:  Diagnosis Date   ADD 10/20/2006   BACK PAIN 05/15/2007   GERD 10/20/2006   HEMATOCHEZIA 12/18/2009   INSOMNIA-SLEEP DISORDER-UNSPEC 05/15/2007   Palpitations 01/25/2008   POLYCYTHEMIA 05/20/2009   PSORIASIS 10/20/2006   SMOKER 05/15/2007   VENEREAL WART 05/15/2007   Past Surgical History:  Procedure Laterality Date   HAND SURGERY Right    ORIF SHOULDER FRACTURE Left 10/08/2022   Procedure: OPEN REDUCTION INTERNAL FIXATION (ORIF) GREATER TUBEROSITY FRACTURE;  Surgeon: Cammy Copa, MD;  Location: [facility] OR;  Service: Orthopedics;  Laterality: Left;  HANDY BED   WISDOM TOOTH EXTRACTION     Patient Active Problem List   Diagnosis Date Noted   Closed fracture dislocation of shoulder with routine healing 10/09/2022   Obesity (BMI 35.0-39.9 without comorbidity) 07/22/2022   Vitamin D deficiency 07/22/2022   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   Hematospermia 09/11/2020   Fatigue 09/27/2019   Erectile dysfunction 09/27/2019   Upper respiratory tract infection 09/27/2019   Screening-pulmonary TB 04/23/2017   Anxiety 04/14/2016   Panic attacks 04/14/2016   Weight gain 11/26/2015   Former smoker 11/26/2015   Pectoralis muscle strain 04/22/2015   Tendinitis of forearm 04/22/2015   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 04/16/2012   Dizziness 01/25/2011   Urinary frequency 01/25/2011   Bilateral leg edema 01/25/2011   Encounter for well adult exam with  abnormal findings 01/02/2011   HEMATOCHEZIA 12/18/2009   WRIST PAIN, RIGHT 12/09/2009   ANKLE PAIN, RIGHT 12/09/2009   POLYCYTHEMIA 05/20/2009   RASH-NONVESICULAR 09/04/2008   ANKLE SPRAIN, LEFT 05/31/2008   Palpitations 01/25/2008   VENEREAL WART 05/15/2007   SMOKER 05/15/2007   BACK PAIN 05/15/2007   INSOMNIA-SLEEP DISORDER-UNSPEC 05/15/2007   Attention deficit disorder 10/20/2006   GERD 10/20/2006   PSORIASIS 10/20/2006    PCP: Oliver Barre, MD  REFERRING PROVIDER: Julieanne Cotton, PA-C  REFERRING DIAG: L proximal humerus fx and dislocation, s/p ORIF  THERAPY DIAG:  History of left shoulder fracture  Stiffness of left shoulder, not elsewhere classified  Acute pain of left shoulder  Muscle weakness of left arm  Rationale for Evaluation and Treatment: Rehabilitation  ONSET DATE: 10/08/22  NEXT MD VISIT: 12/01/22   SUBJECTIVE:  SUBJECTIVE STATEMENT: Pt reports no pain just tightness, Hand dominance: Right  PERTINENT HISTORY: Open reduction and internal fixation of left proximal humerus fracture.   PAIN:  Are you having pain? Yes: NPRS scale: 0/10 Pain location: L neck and shoulder into scapula  Pain description: stiff, aching, worse in am, when wakes up Aggravating factors: lying down Relieving factors: oxycodone, muscle relaxer  PRECAUTIONS: Other: As of 11/03/22 ortho PA notes: He is okay for full passive range of motion and okay to start active assisted range of motion.  No full active range of motion or rotator cuff strengthening exercises until cleared at his next appointment.  Follow-up in 4 weeks for clinical recheck with Dr. August Saucer.  RED FLAGS: None   WEIGHT BEARING RESTRICTIONS: Yes NWB L UE  FALLS:  Has patient fallen in last 6 months? Yes. Number of falls 1  LIVING  ENVIRONMENT: Lives with: lives with their family Lives in: House/apartment Stairs: Yes: Internal: 11 steps; on left going up and External: 3 steps; on right going up Has following equipment at home: None  OCCUPATION: Multimedia programmer, on computer  PLOF: Independent  PATIENT GOALS:to be able to participate in golfing, activities, with family, use L arm for dressing, bathing, I   OBJECTIVE:   DIAGNOSTIC FINDINGS:  Images available in epic chart review. Has plate and 8 screws L prox humerus  PATIENT SURVEYS:  Quick Dash 79.5%  COGNITION: Overall cognitive status: Within functional limits for tasks assessed     SENSATION: WFL  POSTURE: Soft sling L shoulder. Rounding L shoulder  UPPER EXTREMITY ROM: PROM L shoulder flexion 70, abd 80, ER -18 R shoulder wfl  Passive ROM Left eval L 11/29/22 AAROM with wand L 12/16/22  L AROM 12/21/22   Shoulder flexion 70 105 122 130  Shoulder extension      Shoulder abduction 80 115 130 112  Shoulder adduction      Shoulder internal rotation    L PSIS  Shoulder external rotation -18 10 17  L habd behind base of neck  Elbow flexion      Elbow extension      Wrist flexion      Wrist extension      Wrist ulnar deviation      Wrist radial deviation      Wrist pronation      Wrist supination       (Blank rows = not tested)  UPPER EXTREMITY MMT: L UE MMT not performed due to recent injury , restrictions. Did note active contraction of all shoulder musculature and elbow, wrist, hand musculature  R hand and wrist deficits with grasp strength due to previous traumatic injury 12/28/22: L shoulder flex L shoulder abd L shoulder ER L shoulder IR  SHOULDER SPECIAL TESTS: NA due to orthopedic restrictions   JOINT MOBILITY TESTING:  na  PALPATION:  Mild undulation L lateral upper arm mild edema L upper arm    TODAY'S TREATMENT:  DATE: 12/28/22: Therapeutic exercise and manual techniques, combined to improve L GH and scapulothoracic jt mobility and inferior, post capsular restrictions: UBE 2.5 F, 2.5 B level 2  Semi reclined for 3 bouts of inferior glides L humerus at 90 degrees abd, 60 sec each to improve post/ inf capsular tightness Supine AAROM, stretching all planes L shoulder, with distraction L GH jt. R side lying for open books with 2 #, therapist providing downward stabilization L scapula, mass practice R side lying for L shoulder ER with towel axilla, 2# x 20 reps, cues to perform quickly for deep stabilization Prone for horizontal L shoulder abd with 2#,  Prone L shoulder ext with 6# Prone supraspinatus lifts - Y lifts, no wt Seated with L upper arm propped on table for ER stretch with 6# wt  After session moist heat L shoulder  12/21/22: Therapeutic exercise and manual techniques, combined to improve L GH and scapulothoracic jt mobility and inferior, post capsular restrictions: UBE L2 2.24F ,2.5B Semi reclined for inferior glides L GH jt x 60 sec bouts x 3 Manual stretching in supine L shoulder all planes, stretching L shoulder ER with humerus adducted, at 60 degrees abd and at 90 degrees abd, therapist provided inferior glide L scapula to prevent excessive firing L upper traps multiple reps Supine isometric shoulder abd sustained with B shoulder flexion 15 reps Supine fast B shoulder ER with red t band 30x Side lying R  for thoracic open books, therapist providing lateral pressure scapula to isolate stretch post capsule Side lying L shoulder abd with distraction of L shoulder/ GH jt   Moist heat L shoulder after session for post ex soreness 8 min   12/16/22 Pulleys: 3 min flexion, 3 min scaption Standing L shoulder ER with wand x 10 Standing isometric shoulder IR YTB asistaning into ER 10x5" Passive stretching into ER with prolonged holds Contract and relax into ER   Supine L shoulder ER with wand towel under elbow S/L L shoulder horizontal ABD x 10   12/14/22:  UBE, lowest resistance, x 2 1/2 F, 2/1/2 B Supine for manual stretching, AAROM by PT for ER, flex, all planes, overpressure end range. Supine serratus punches 20x Supine L shoulder ER , stretch with green t band, then concentric pull into IR , multiple reps Supine red t band ER , short bursts, low amplitude, high speed with control emphasized, mass practice to fatigue Side lying R for L shoulder abduction, with therapist applying medial scapular glide to isolate inferior axilla Sidelying scapular punches to ceiling, 15 x Side lying on R for open books, therapist assisting  Standing wall slides with hand in pillow case into flex, scaption Standing B shoulder forward flexion hands in pillow case to engage scapular retractors Shoulder pulley 5 min Wall push ups 10 x   Moist heat L shoulder after session , 10 min 12/02/22:  Pulleys 5 min Seated with towel under L axilla for wand ER, 10x  Various supine stretching therex, performed with intermittent light GH distraction and shaking to relieve pain Supine for therapist assisted L serratus punches Supine for therapist assisted L Pec flys to stretch L ant chest Supine sustained stretch into ER with red theraband in palm, tolerated 1 1/2 min R Side lying for scapular mobs , lat glides Side lying therapist assisted punches to ceiling  R side lying shoulder abduction stretch R sidelying for L shoulder ER assisted.  Supine thoracic spine stretch with 1/2 foam roller along thoracic spine to  open L ant chest wall  Moist heat ant and post shoulder at end of session, not billed  11/29/22 Seated swiffer AAROM slides into flexion x 15; scaption x 15 Pulleys x 3 min flexion; x 3 min scaption Supine wand flexion x 10  Supine wand ER x 10  Measured ROM  Vasopnuematic: game ready 10 min at end of session L shoulder in sitting. 11/25/22 Therapeutic  exercise: to improve flexibility L shoulder Supine PROM/ AAROM L shoulder mass practice  Added supine cane ER 5 sec holds, 10 x Added supine cane scapular punches therapist with light support L arm to maintain vertical position Supine cane flexion assisted from 90 to 100, 5 reps R side lying for scapular clocks, therapist providing tactile cues with hands Standing table slides with basketball on counter flex, scaption 10x Kinesiotape L shoulder, 2- I pieces , one from mid delts to middle traps, one from lateral delts to L upper traps, 35% pull  Vasopnuematic: game ready 10 min at end of session L shoulder in sitting.  11/22/22 MANUAL THERAPY: To promote normalized muscle tension, improved flexibility, improved joint mobility, increased ROM, and reduced pain.  STM/DTM and manual TPR to L UT, LS, rhomboids, middle & lower traps, lats  THERAPEUTIC EXERCISE: to improve flexibility, strength and mobility.  Demonstration, verbal and tactile cues throughout for technique. Pulleys: Flexion x 3 min Seated L AA flexion with swiffer x 10 Seated L AA scaption with swiffer x 10 Seated L AA circles with swiffer x 10  PROM to L shoulder MODALITIES: Game Ready vasopneumatic compression post session to L shoulder x 10 min, low compression, 34 to reduce post-exercise pain and swelling/edema  11/15/22 THERAPEUTIC EXERCISE: to improve flexibility, strength and mobility.  Demonstration, verbal and tactile cues throughout for technique. Pulleys: Flexion x 3 min B rhomboid (lower cervical/upper thoracic) stretch 3 x 30" - hands supported on Swiffer handle L UT stretch 3 x 30" L LS stretch 3 x 30" L shoulder flexion AAROM - holding onto Swiffer handle (as UE ranger) x 10 L shoulder scaption AAROM - holding onto Swiffer handle (as UE ranger) x 10 L shoulder pendulums - flexion/extension, horiz ABD/ADD, CW/CCW x 30" each   MANUAL THERAPY: To promote normalized muscle tension, improved flexibility, improved  joint mobility, increased ROM, and reduced pain.  STM/DTM and manual TPR to L UT, LS, rhomboids, middle & lower traps, infraspinatus L shoulder gentle distraction +/- CW/CCW oscillations to promote muscle relaxation L shoulder PROM to tolerance in supine  MODALITIES: Game Ready vasopneumatic compression post session to L shoulder x 10 min, low compression, 34 to reduce post-exercise pain and swelling/edema   11/11/22  Therapeutic Exercise: to improve strength and mobility.  Demo, verbal and tactile cues throughout for technique.  Table slides into flexion x 10  Table slides scaption x 10  Seated L UT 2x30 sec Seated L LS 2x30 sec Seated scap retraction 10x3" PROM to L shoulder flexion and scaption Manual Therapy: STM to L UT, LS, rhomboids  Vaso to L shoulder for edema 34 deg ;low compression x 10 min   11/09/22 Therapeutic Exercise: to improve strength and mobility.  Demo, verbal and tactile cues throughout for technique.  PROM within MD precautions Putty squeezes for hand and wrist x 10  Seated L UT stretch 2x30 sec Seated L LS stretch 2x30 sec Seated thoracic extension 10x3" Seated table slides into flexion PROM x 10    PATIENT EDUCATION: Education details: HEP review and HEP modification -  use of Swiffer as alternative for table slides Person educated: Patient and Spouse Education method: Explanation, Demonstration, Tactile cues, and Verbal cues Education comprehension: verbalized understanding and returned demonstration  HOME EXERCISE PROGRAM: Access Code: [email or web address] URL: [email or web address] Date: 11/11/2022 Prepared by: Verta Ellen  Exercises - Seated Upper Trapezius Stretch  - 2 x daily - 7 x weekly - 2 sets - 2 reps - 30 sec hold - Gentle Levator Scapulae Stretch  - 2 x daily - 7 x weekly - 2 sets - 2 reps - 30 sec hold - Seated Thoracic Extension AROM  - 1 x daily - 7 x weekly - 2 sets - 10 reps - Seated Shoulder Flexion Towel Slide at Table  Top  - 1 x daily - 7 x weekly - 2 sets - 10 reps - Seated Shoulder Scaption Slide at Table Top with Forearm in Neutral  - 1 x daily - 7 x weekly - 2 sets - 10 reps - Seated Scapular Retraction  - 1 x daily - 7 x weekly - 2 sets - 10 reps   ASSESSMENT:  CLINICAL IMPRESSION: Continued with AAROM exercises and stretching to improve functional ER of L shoulder. He demonstrates much improved AROM of L shoulder, he has a hard time isolating ER, much better L shoulder ER with combined manual depression L scapula.  He is progressing with his goals, continues to need work on stretching for ER. Reviewed the wall push ups with him again today verbally.  Koki will benefit from continued skilled PT to address above deficits to improve mobility and activity tolerance with decreased pain interference.  OBJECTIVE IMPAIRMENTS: decreased activity tolerance, decreased ROM, decreased strength, increased edema, increased fascial restrictions, impaired flexibility, and pain.   ACTIVITY LIMITATIONS: carrying, lifting, sleeping, bathing, toileting, dressing, and reach over head  PARTICIPATION LIMITATIONS: meal prep, cleaning, laundry, driving, shopping, community activity, and yard work  PERSONAL FACTORS: Fitness, Past/current experiences, Time since onset of injury/illness/exacerbation, Transportation, and 1-2 comorbidities: psoriasis, LBP  are also affecting patient's functional outcome.   REHAB POTENTIAL: Good  CLINICAL DECISION MAKING: Stable/uncomplicated  EVALUATION COMPLEXITY: Low   GOALS: Goals reviewed with patient? Yes  SHORT TERM GOALS: Target date: 4 weeks, 12/02/22  ROM L shoulder flex , abd 90, ER 30 Baseline:flex 70, abd 80, Er -18 Goal status: IN PROGRESS- met for flexion- 11/29/22  LONG TERM GOALS: Target date: 12 weeks: 01/27/23  I HEP Baseline: TBD Goal status: Met for current HEP- 11/22/22  2.  L shoulder ROM for elevation 130 or greater for functional reach, ADL's Baseline:  Goal  status: IN PROGRESS- 12/16/22  3.  L shoulder ER 60 or greater for functional reach, IR hand behind back to L 1 for dressing, bathing  Baseline:  Goal status: IN PROGRESS- 12/16/22 see charts above  4.  Quick DASH L shoulder 25% or less Baseline: 79.5% disability Goal status: IN PROGRESS  5.  Strength L shoulder 4+/5 all planes for ability to play golf, perform yard work, lifting Baseline:  Goal status: IN PROGRESS    PLAN:  PT FREQUENCY: 2x/week  PT DURATION: 12 weeks  PLANNED INTERVENTIONS: Therapeutic exercises, Therapeutic activity, Neuromuscular re-education, Balance training, Gait training, Patient/Family education, Self Care, and Joint mobilization  PLAN FOR NEXT SESSION: continue PROM and AAROM , try to engage with Er as much as pt can tolerate   Jerico Grisso L Rheda Kassab, PT, DPT, OCS 12/28/2022, 3:13 PM

## 2022-12-29 ENCOUNTER — Ambulatory Visit: Payer: BC Managed Care – PPO | Admitting: Orthopedic Surgery

## 2022-12-29 DIAGNOSIS — S42252D Displaced fracture of greater tuberosity of left humerus, subsequent encounter for fracture with routine healing: Secondary | ICD-10-CM

## 2022-12-29 NOTE — Progress Notes (Signed)
Post-Op Visit Note   Patient: Isaiah Gibson           Date of Birth: 09-Oct-1985           MRN: [id number] Visit Date: 12/29/2022 PCP: Corwin Levins, MD   Assessment & Plan:  Chief Complaint:  Chief Complaint  Patient presents with   Left Shoulder - Routine Post Op     LEFT SHOULDER ORIF (surgery date 10-08-22)     Visit Diagnoses:  1. Closed displaced fracture of greater tuberosity of left humerus with routine healing, subsequent encounter     Plan: Isaiah Gibson is a patient underwent left shoulder open reduction internal fixation on 10/08/2022.  States he has good and bad days.  Doing physical therapy 2 times a week plus home exercise program.  He is doing wall push-ups as well as using a broom for stretching.  On examination his range of motion is 17/80/120.  He is improving some.  Takes occasional muscle relaxer.  Plan at this time is 8-week return with decision for or against manipulation under anesthesia with rotator interval release.  May also decide for or against nerve conduction study because he is having some atypical numbness in his fingers.  Follow-Up Instructions: No follow-ups on file.   Orders:  No orders of the defined types were placed in this encounter.  No orders of the defined types were placed in this encounter.   Imaging: No results found.  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   Hematospermia 09/11/2020   Fatigue 09/27/2019   Erectile dysfunction 09/27/2019   Upper respiratory tract infection 09/27/2019   Screening-pulmonary TB 04/23/2017   Anxiety 04/14/2016   Panic attacks 04/14/2016   Weight gain 11/26/2015   Former smoker 11/26/2015   Pectoralis muscle strain 04/22/2015   Tendinitis of forearm 04/22/2015   Alcohol ingestion  of more than 4 drinks/week 04/16/2012   Dizziness 01/25/2011   Urinary frequency 01/25/2011   Bilateral leg edema 01/25/2011   Encounter for well adult exam with abnormal findings 01/02/2011   HEMATOCHEZIA 12/18/2009   WRIST PAIN, RIGHT 12/09/2009   ANKLE PAIN, RIGHT 12/09/2009   POLYCYTHEMIA 05/20/2009   RASH-NONVESICULAR 09/04/2008   ANKLE SPRAIN, LEFT 05/31/2008   Palpitations 01/25/2008   VENEREAL WART 05/15/2007   SMOKER 05/15/2007   BACK PAIN 05/15/2007   INSOMNIA-SLEEP DISORDER-UNSPEC 05/15/2007   Attention deficit disorder 10/20/2006   GERD 10/20/2006   PSORIASIS 10/20/2006   Past Medical History:  Diagnosis Date   ADD 10/20/2006   BACK PAIN 05/15/2007   GERD 10/20/2006   HEMATOCHEZIA 12/18/2009   INSOMNIA-SLEEP DISORDER-UNSPEC 05/15/2007   Palpitations 01/25/2008   POLYCYTHEMIA 05/20/2009   PSORIASIS 10/20/2006   SMOKER 05/15/2007   VENEREAL WART 05/15/2007    Family History  Problem Relation Age of Onset   Hypertension Other        Grandfather    Past Surgical History:  Procedure Laterality Date   HAND SURGERY Right    ORIF SHOULDER FRACTURE Left 10/08/2022   Procedure: OPEN REDUCTION INTERNAL FIXATION (ORIF) GREATER TUBEROSITY FRACTURE;  Surgeon: Cammy Copa, MD;  Location: [facility] OR;  Service: Orthopedics;  Laterality: Left;  HANDY BED   WISDOM TOOTH EXTRACTION     Social History   Occupational History   Not on file  Tobacco Use   Smoking status: Former    Current packs/day: 0.00    Types: Cigarettes    Quit date: 09/19/2014    Years since quitting: 8.2   Smokeless tobacco: Never  Vaping Use   Vaping status: Never Used  Substance and Sexual Activity   Alcohol use: Yes    Alcohol/week: 7.0 standard drinks of alcohol    Types: 7 Shots of liquor per week    Comment: daily ETOH hard liquor   Drug use: Yes    Types: Marijuana    Comment: smoke and gummies   Sexual activity: Not on file

## 2022-12-30 ENCOUNTER — Other Ambulatory Visit: Payer: Self-pay

## 2022-12-30 ENCOUNTER — Ambulatory Visit: Payer: BC Managed Care – PPO

## 2022-12-30 DIAGNOSIS — Z8781 Personal history of (healed) traumatic fracture: Secondary | ICD-10-CM

## 2022-12-30 DIAGNOSIS — M6281 Muscle weakness (generalized): Secondary | ICD-10-CM

## 2022-12-30 DIAGNOSIS — M25612 Stiffness of left shoulder, not elsewhere classified: Secondary | ICD-10-CM

## 2022-12-30 DIAGNOSIS — M25512 Pain in left shoulder: Secondary | ICD-10-CM

## 2022-12-30 NOTE — Therapy (Signed)
OUTPATIENT PHYSICAL THERAPY TREATMENT  Progress Note Reporting Period 11/04/22 to 12/30/22  See note below for Objective Data and Assessment of Progress/Goals.     Patient Name: Isaiah Gibson MRN: [id number] DOB:05-28-1985, 37 y.o., male Today's Date: 12/30/2022  END OF SESSION:  PT End of Session - 12/30/22 0936     Visit Number 13    Date for PT Re-Evaluation 01/27/23    Progress Note Due on Visit 20    PT Start Time 0930    PT Stop Time 1014    PT Time Calculation (min) 44 min    Activity Tolerance Patient tolerated treatment well    Behavior During Therapy [facility] for tasks assessed/performed                 Past Medical History:  Diagnosis Date   ADD 10/20/2006   BACK PAIN 05/15/2007   GERD 10/20/2006   HEMATOCHEZIA 12/18/2009   INSOMNIA-SLEEP DISORDER-UNSPEC 05/15/2007   Palpitations 01/25/2008   POLYCYTHEMIA 05/20/2009   PSORIASIS 10/20/2006   SMOKER 05/15/2007   VENEREAL WART 05/15/2007   Past Surgical History:  Procedure Laterality Date   HAND SURGERY Right    ORIF SHOULDER FRACTURE Left 10/08/2022   Procedure: OPEN REDUCTION INTERNAL FIXATION (ORIF) GREATER TUBEROSITY FRACTURE;  Surgeon: Cammy Copa, MD;  Location: [facility] OR;  Service: Orthopedics;  Laterality: Left;  HANDY BED   WISDOM TOOTH EXTRACTION     Patient Active Problem List   Diagnosis Date Noted   Closed fracture dislocation of shoulder with routine healing 10/09/2022   Obesity (BMI 35.0-39.9 without comorbidity) 07/22/2022   Vitamin D deficiency 07/22/2022   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   Hematospermia 09/11/2020   Fatigue 09/27/2019   Erectile dysfunction 09/27/2019   Upper respiratory tract infection 09/27/2019   Screening-pulmonary TB 04/23/2017   Anxiety 04/14/2016   Panic attacks 04/14/2016   Weight gain 11/26/2015   Former smoker 11/26/2015   Pectoralis muscle strain 04/22/2015   Tendinitis of forearm 04/22/2015   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 04/16/2012    Dizziness 01/25/2011   Urinary frequency 01/25/2011   Bilateral leg edema 01/25/2011   Encounter for well adult exam with abnormal findings 01/02/2011   HEMATOCHEZIA 12/18/2009   WRIST PAIN, RIGHT 12/09/2009   ANKLE PAIN, RIGHT 12/09/2009   POLYCYTHEMIA 05/20/2009   RASH-NONVESICULAR 09/04/2008   ANKLE SPRAIN, LEFT 05/31/2008   Palpitations 01/25/2008   VENEREAL WART 05/15/2007   SMOKER 05/15/2007   BACK PAIN 05/15/2007   INSOMNIA-SLEEP DISORDER-UNSPEC 05/15/2007   Attention deficit disorder 10/20/2006   GERD 10/20/2006   PSORIASIS 10/20/2006    PCP: Oliver Barre, MD  REFERRING PROVIDER: Julieanne Cotton, PA-C  REFERRING DIAG: L proximal humerus fx and dislocation, s/p ORIF  THERAPY DIAG:  History of left shoulder fracture  Stiffness of left shoulder, not elsewhere classified  Muscle weakness of left arm  Acute pain of left shoulder  Rationale for Evaluation and Treatment: Rehabilitation  ONSET DATE: 10/08/22  NEXT MD VISIT: 12/01/22   SUBJECTIVE:  SUBJECTIVE STATEMENT: Pt reports no pain just tightness, saw referring surgeon, ER improving, so no impending manipulation or surgery. To work with PT through end of year. Hand dominance: Right  PERTINENT HISTORY: Open reduction and internal fixation of left proximal humerus fracture.   PAIN:  Are you having pain? Yes: NPRS scale: 0/10 Pain location: L neck and shoulder into scapula  Pain description: stiff, aching, worse in am, when wakes up Aggravating factors: lying down Relieving factors: oxycodone, muscle relaxer  PRECAUTIONS: Other: As of 11/03/22 ortho PA notes: He is okay for full passive range of motion and okay to start active assisted range of motion.  No full active range of motion or rotator cuff strengthening exercises until  cleared at his next appointment.  Follow-up in 4 weeks for clinical recheck with Dr. August Saucer.  RED FLAGS: None   WEIGHT BEARING RESTRICTIONS: Yes NWB L UE  FALLS:  Has patient fallen in last 6 months? Yes. Number of falls 1  LIVING ENVIRONMENT: Lives with: lives with their family Lives in: House/apartment Stairs: Yes: Internal: 11 steps; on left going up and External: 3 steps; on right going up Has following equipment at home: None  OCCUPATION: Multimedia programmer, on computer  PLOF: Independent  PATIENT GOALS:to be able to participate in golfing, activities, with family, use L arm for dressing, bathing, I   OBJECTIVE:   DIAGNOSTIC FINDINGS:  Images available in epic chart review. Has plate and 8 screws L prox humerus  PATIENT SURVEYS:  Quick Dash 79.5%  COGNITION: Overall cognitive status: Within functional limits for tasks assessed     SENSATION: WFL  POSTURE: Soft sling L shoulder. Rounding L shoulder  UPPER EXTREMITY ROM: PROM L shoulder flexion 70, abd 80, ER -18 R shoulder wfl  Passive ROM Left eval L 11/29/22 AAROM with wand L 12/16/22  L AROM 12/21/22  L AROM 12/30/22  Shoulder flexion 70 105 122 130 142  Shoulder extension       Shoulder abduction 80 115 130 112 130  Shoulder adduction       Shoulder internal rotation    L PSIS   Shoulder external rotation -18 10 17  L habd behind base of neck 28  Elbow flexion       Elbow extension       Wrist flexion       Wrist extension       Wrist ulnar deviation       Wrist radial deviation       Wrist pronation       Wrist supination        (Blank rows = not tested)  UPPER EXTREMITY MMT: L UE MMT not performed due to recent injury , restrictions. Did note active contraction of all shoulder musculature and elbow, wrist, hand musculature  R hand and wrist deficits with grasp strength due to previous traumatic injury 12/30/22: L shoulder flex 3+ L shoulder abd 3+ L shoulder ER 4 L shoulder IR 4  SHOULDER  SPECIAL TESTS: NA due to orthopedic restrictions   JOINT MOBILITY TESTING:  na  PALPATION:  Mild undulation L lateral upper arm mild edema L upper arm    TODAY'S TREATMENT:  DATE: 12/30/22:  therapeutic exercise: combined with manual techniques to address movement patterns and restriction L shoulder:  UBE 2.5 F, 2.5 B level 2  Reclined for 3 bouts of inferior glides L humerus at 90 degrees abd, 60 sec each to improve post/ inf capsular tightness Supine AAROM, stretching all planes L shoulder, with distraction L GH jt. R side lying for open books with 2 #, therapist providing downward stabilization L scapula, mass practice R  side lying for L shoulder ER 2#, 3 x 10 R side lying with L shoulder abduction stretch, therapist providing medial scapular glides L  Prone horizontal abd 2# Prone Y's no wt, with therapist providing PA glide thoracic region and depression L scapula Standing for self traction L humerus by gripping table and rocking backward to improve flexion Rom L   Moist heat L shoulder after session for post ex soreness 8 min   12/28/22: Therapeutic exercise and manual techniques, combined to improve L GH and scapulothoracic jt mobility and inferior, post capsular restrictions: UBE 2.5 F, 2.5 B level 2  Semi reclined for 3 bouts of inferior glides L humerus at 90 degrees abd, 60 sec each to improve post/ inf capsular tightness Supine AAROM, stretching all planes L shoulder, with distraction L GH jt. R side lying for open books with 2 #, therapist providing downward stabilization L scapula, mass practice R side lying for L shoulder ER with towel axilla, 2# x 20 reps, cues to perform quickly for deep stabilization Prone for horizontal L shoulder abd with 2#,  Prone L shoulder ext with 6# Prone supraspinatus lifts - Y lifts, no wt Seated with L  upper arm propped on table for ER stretch with 6# wt  After session moist heat L shoulder  12/21/22: Therapeutic exercise and manual techniques, combined to improve L GH and scapulothoracic jt mobility and inferior, post capsular restrictions: UBE L2 2.40F ,2.5B Semi reclined for inferior glides L GH jt x 60 sec bouts x 3 Manual stretching in supine L shoulder all planes, stretching L shoulder ER with humerus adducted, at 60 degrees abd and at 90 degrees abd, therapist provided inferior glide L scapula to prevent excessive firing L upper traps multiple reps Supine isometric shoulder abd sustained with B shoulder flexion 15 reps Supine fast B shoulder ER with red t band 30x Side lying R  for thoracic open books, therapist providing lateral pressure scapula to isolate stretch post capsule Side lying L shoulder abd with distraction of L shoulder/ GH jt   Moist heat L shoulder after session for post ex soreness 8 min   12/16/22 Pulleys: 3 min flexion, 3 min scaption Standing L shoulder ER with wand x 10 Standing isometric shoulder IR YTB asistaning into ER 10x5" Passive stretching into ER with prolonged holds Contract and relax into ER  Supine L shoulder ER with wand towel under elbow S/L L shoulder horizontal ABD x 10   12/14/22:  UBE, lowest resistance, x 2 1/2 F, 2/1/2 B Supine for manual stretching, AAROM by PT for ER, flex, all planes, overpressure end range. Supine serratus punches 20x Supine L shoulder ER , stretch with green t band, then concentric pull into IR , multiple reps Supine red t band ER , short bursts, low amplitude, high speed with control emphasized, mass practice to fatigue Side lying R for L shoulder abduction, with therapist applying medial scapular glide to isolate inferior axilla Sidelying scapular punches to ceiling, 15 x Side lying on R for open  books, therapist assisting  Standing wall slides with hand in pillow case into flex, scaption Standing B shoulder  forward flexion hands in pillow case to engage scapular retractors Shoulder pulley 5 min Wall push ups 10 x   Moist heat L shoulder after session , 10 min 12/02/22:  Pulleys 5 min Seated with towel under L axilla for wand ER, 10x  Various supine stretching therex, performed with intermittent light GH distraction and shaking to relieve pain Supine for therapist assisted L serratus punches Supine for therapist assisted L Pec flys to stretch L ant chest Supine sustained stretch into ER with red theraband in palm, tolerated 1 1/2 min R Side lying for scapular mobs , lat glides Side lying therapist assisted punches to ceiling  R side lying shoulder abduction stretch R sidelying for L shoulder ER assisted.  Supine thoracic spine stretch with 1/2 foam roller along thoracic spine to open L ant chest wall  Moist heat ant and post shoulder at end of session, not billed  11/29/22 Seated swiffer AAROM slides into flexion x 15; scaption x 15 Pulleys x 3 min flexion; x 3 min scaption Supine wand flexion x 10  Supine wand ER x 10  Measured ROM  Vasopnuematic: game ready 10 min at end of session L shoulder in sitting. 11/25/22 Therapeutic exercise: to improve flexibility L shoulder Supine PROM/ AAROM L shoulder mass practice  Added supine cane ER 5 sec holds, 10 x Added supine cane scapular punches therapist with light support L arm to maintain vertical position Supine cane flexion assisted from 90 to 100, 5 reps R side lying for scapular clocks, therapist providing tactile cues with hands Standing table slides with basketball on counter flex, scaption 10x Kinesiotape L shoulder, 2- I pieces , one from mid delts to middle traps, one from lateral delts to L upper traps, 35% pull  Vasopnuematic: game ready 10 min at end of session L shoulder in sitting.  11/22/22 MANUAL THERAPY: To promote normalized muscle tension, improved flexibility, improved joint mobility, increased ROM, and reduced pain.   STM/DTM and manual TPR to L UT, LS, rhomboids, middle & lower traps, lats  THERAPEUTIC EXERCISE: to improve flexibility, strength and mobility.  Demonstration, verbal and tactile cues throughout for technique. Pulleys: Flexion x 3 min Seated L AA flexion with swiffer x 10 Seated L AA scaption with swiffer x 10 Seated L AA circles with swiffer x 10  PROM to L shoulder MODALITIES: Game Ready vasopneumatic compression post session to L shoulder x 10 min, low compression, 34 to reduce post-exercise pain and swelling/edema  11/15/22 THERAPEUTIC EXERCISE: to improve flexibility, strength and mobility.  Demonstration, verbal and tactile cues throughout for technique. Pulleys: Flexion x 3 min B rhomboid (lower cervical/upper thoracic) stretch 3 x 30" - hands supported on Swiffer handle L UT stretch 3 x 30" L LS stretch 3 x 30" L shoulder flexion AAROM - holding onto Swiffer handle (as UE ranger) x 10 L shoulder scaption AAROM - holding onto Swiffer handle (as UE ranger) x 10 L shoulder pendulums - flexion/extension, horiz ABD/ADD, CW/CCW x 30" each   MANUAL THERAPY: To promote normalized muscle tension, improved flexibility, improved joint mobility, increased ROM, and reduced pain.  STM/DTM and manual TPR to L UT, LS, rhomboids, middle & lower traps, infraspinatus L shoulder gentle distraction +/- CW/CCW oscillations to promote muscle relaxation L shoulder PROM to tolerance in supine  MODALITIES: Game Ready vasopneumatic compression post session to L shoulder x 10 min, low  compression, 34 to reduce post-exercise pain and swelling/edema   11/11/22  Therapeutic Exercise: to improve strength and mobility.  Demo, verbal and tactile cues throughout for technique.  Table slides into flexion x 10  Table slides scaption x 10  Seated L UT 2x30 sec Seated L LS 2x30 sec Seated scap retraction 10x3" PROM to L shoulder flexion and scaption Manual Therapy: STM to L UT, LS, rhomboids  Vaso to L  shoulder for edema 34 deg ;low compression x 10 min   11/09/22 Therapeutic Exercise: to improve strength and mobility.  Demo, verbal and tactile cues throughout for technique.  PROM within MD precautions Putty squeezes for hand and wrist x 10  Seated L UT stretch 2x30 sec Seated L LS stretch 2x30 sec Seated thoracic extension 10x3" Seated table slides into flexion PROM x 10    PATIENT EDUCATION: Education details: HEP review and HEP modification - use of Swiffer as alternative for table slides Person educated: Patient and Spouse Education method: Explanation, Demonstration, Tactile cues, and Verbal cues Education comprehension: verbalized understanding and returned demonstration  HOME EXERCISE PROGRAM: Access Code: [email or web address] URL: [email or web address] Date: 11/11/2022 Prepared by: Verta Ellen  Exercises - Seated Upper Trapezius Stretch  - 2 x daily - 7 x weekly - 2 sets - 2 reps - 30 sec hold - Gentle Levator Scapulae Stretch  - 2 x daily - 7 x weekly - 2 sets - 2 reps - 30 sec hold - Seated Thoracic Extension AROM  - 1 x daily - 7 x weekly - 2 sets - 10 reps - Seated Shoulder Flexion Towel Slide at Table Top  - 1 x daily - 7 x weekly - 2 sets - 10 reps - Seated Shoulder Scaption Slide at Table Top with Forearm in Neutral  - 1 x daily - 7 x weekly - 2 sets - 10 reps - Seated Scapular Retraction  - 1 x daily - 7 x weekly - 2 sets - 10 reps   ASSESSMENT:  CLINICAL IMPRESSION: Continued with AAROM exercises and stretching to improve functional ER of L shoulder. He demonstrates much improved AROM of L shoulder, better L shoulder ER with combined manual depression L scapula. Difficulty with pure flexion so tried to direct manual techniques and specific stretching for this motion. He is progressing with his goals, continues to need work on stretching for ER. Strength much improved. Nithin will benefit from continued skilled PT to address above deficits to improve mobility  and activity tolerance with decreased pain interference.  OBJECTIVE IMPAIRMENTS: decreased activity tolerance, decreased ROM, decreased strength, increased edema, increased fascial restrictions, impaired flexibility, and pain.   ACTIVITY LIMITATIONS: carrying, lifting, sleeping, bathing, toileting, dressing, and reach over head  PARTICIPATION LIMITATIONS: meal prep, cleaning, laundry, driving, shopping, community activity, and yard work  PERSONAL FACTORS: Fitness, Past/current experiences, Time since onset of injury/illness/exacerbation, Transportation, and 1-2 comorbidities: psoriasis, LBP  are also affecting patient's functional outcome.   REHAB POTENTIAL: Good  CLINICAL DECISION MAKING: Stable/uncomplicated  EVALUATION COMPLEXITY: Low   GOALS: Goals reviewed with patient? Yes  SHORT TERM GOALS: Target date: 4 weeks, 12/02/22  ROM L shoulder flex , abd 90, ER 30 Baseline:flex 70, abd 80, Er -18 Goal status: IN PROGRESS- met for flexion- 11/29/22  LONG TERM GOALS: Target date: 12 weeks: 01/27/23  I HEP Baseline: TBD Goal status: Met for current HEP- 11/22/22  2.  L shoulder ROM for elevation 130 or greater for functional reach, ADL's Baseline:  Goal status: IN PROGRESS-  12/16/22 12/30/22 see charts above  3.  L shoulder ER 60 or greater for functional reach, IR hand behind back to L 1 for dressing, bathing  Baseline:  Goal status: IN PROGRESS- 12/16/22 see charts above 12/30/22 progressing see above charts  4.  Quick DASH L shoulder 25% or less Baseline: 79.5% disability Goal status: IN PROGRESS  5.  Strength L shoulder 4+/5 all planes for ability to play golf, perform yard work, lifting Baseline:  Goal status: IN PROGRESS 12/30/22 see above MMT    PLAN:  PT FREQUENCY: 2x/week  PT DURATION: 12 weeks  PLANNED INTERVENTIONS: Therapeutic exercises, Therapeutic activity, Neuromuscular re-education, Balance training, Gait training, Patient/Family education, Self Care,  and Joint mobilization  PLAN FOR NEXT SESSION: continue PROM and AAROM , try to engage with Er as much as pt can tolerate   Takira Sherrin L Merla Sawka, PT, DPT, OCS 12/30/2022, 4:27 PM

## 2023-01-01 ENCOUNTER — Encounter: Payer: Self-pay | Admitting: Orthopedic Surgery

## 2023-01-04 ENCOUNTER — Ambulatory Visit: Payer: BC Managed Care – PPO

## 2023-01-04 ENCOUNTER — Other Ambulatory Visit: Payer: Self-pay

## 2023-01-04 DIAGNOSIS — Z8781 Personal history of (healed) traumatic fracture: Secondary | ICD-10-CM

## 2023-01-04 DIAGNOSIS — M25512 Pain in left shoulder: Secondary | ICD-10-CM

## 2023-01-04 DIAGNOSIS — M25612 Stiffness of left shoulder, not elsewhere classified: Secondary | ICD-10-CM

## 2023-01-04 DIAGNOSIS — M6281 Muscle weakness (generalized): Secondary | ICD-10-CM

## 2023-01-04 NOTE — Therapy (Signed)
OUTPATIENT PHYSICAL THERAPY TREATMENT     Patient Name: Isaiah Gibson MRN: [id number] DOB:1985/08/28, 37 y.o., male Today's Date: 01/04/2023  END OF SESSION:  PT End of Session - 01/04/23 1109     Visit Number 14    Date for PT Re-Evaluation 01/27/23    Progress Note Due on Visit 20    PT Start Time 1022    PT Stop Time 1103    PT Time Calculation (min) 41 min    Activity Tolerance Patient tolerated treatment well    Behavior During Therapy [facility] for tasks assessed/performed                  Past Medical History:  Diagnosis Date   ADD 10/20/2006   BACK PAIN 05/15/2007   GERD 10/20/2006   HEMATOCHEZIA 12/18/2009   INSOMNIA-SLEEP DISORDER-UNSPEC 05/15/2007   Palpitations 01/25/2008   POLYCYTHEMIA 05/20/2009   PSORIASIS 10/20/2006   SMOKER 05/15/2007   VENEREAL WART 05/15/2007   Past Surgical History:  Procedure Laterality Date   HAND SURGERY Right    ORIF SHOULDER FRACTURE Left 10/08/2022   Procedure: OPEN REDUCTION INTERNAL FIXATION (ORIF) GREATER TUBEROSITY FRACTURE;  Surgeon: Cammy Copa, MD;  Location: [facility] OR;  Service: Orthopedics;  Laterality: Left;  HANDY BED   WISDOM TOOTH EXTRACTION     Patient Active Problem List   Diagnosis Date Noted   Closed fracture dislocation of shoulder with routine healing 10/09/2022   Obesity (BMI 35.0-39.9 without comorbidity) 07/22/2022   Vitamin D deficiency 07/22/2022   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   Hematospermia 09/11/2020   Fatigue 09/27/2019   Erectile dysfunction 09/27/2019   Upper respiratory tract infection 09/27/2019   Screening-pulmonary TB 04/23/2017   Anxiety 04/14/2016   Panic attacks 04/14/2016   Weight gain 11/26/2015   Former smoker 11/26/2015   Pectoralis muscle strain 04/22/2015   Tendinitis of forearm 04/22/2015   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 04/16/2012   Dizziness 01/25/2011   Urinary frequency 01/25/2011   Bilateral leg edema 01/25/2011   Encounter for well adult exam  with abnormal findings 01/02/2011   HEMATOCHEZIA 12/18/2009   WRIST PAIN, RIGHT 12/09/2009   ANKLE PAIN, RIGHT 12/09/2009   POLYCYTHEMIA 05/20/2009   RASH-NONVESICULAR 09/04/2008   ANKLE SPRAIN, LEFT 05/31/2008   Palpitations 01/25/2008   VENEREAL WART 05/15/2007   SMOKER 05/15/2007   BACK PAIN 05/15/2007   INSOMNIA-SLEEP DISORDER-UNSPEC 05/15/2007   Attention deficit disorder 10/20/2006   GERD 10/20/2006   PSORIASIS 10/20/2006    PCP: Oliver Barre, MD  REFERRING PROVIDER: Julieanne Cotton, PA-C  REFERRING DIAG: L proximal humerus fx and dislocation, s/p ORIF  THERAPY DIAG:  History of left shoulder fracture  Stiffness of left shoulder, not elsewhere classified  Muscle weakness of left arm  Acute pain of left shoulder  Rationale for Evaluation and Treatment: Rehabilitation  ONSET DATE: 10/08/22  NEXT MD VISIT: 12/01/22   SUBJECTIVE:  SUBJECTIVE STATEMENT: Pt reports some neural hypersensitivity L hand ulnar region.  Also getting tight through upper traps , middle traps, L  pecs.   Hand dominance: Right  PERTINENT HISTORY: Open reduction and internal fixation of left proximal humerus fracture.   PAIN:  Are you having pain? Yes: NPRS scale: 0/10 Pain location: L neck and shoulder into scapula  Pain description: stiff, aching, worse in am, when wakes up Aggravating factors: lying down Relieving factors: oxycodone, muscle relaxer  PRECAUTIONS: Other: As of 11/03/22 ortho PA notes: He is okay for full passive range of motion and okay to start active assisted range of motion.  No full active range of motion or rotator cuff strengthening exercises until cleared at his next appointment.  Follow-up in 4 weeks for clinical recheck with Dr. August Saucer.  RED FLAGS: None   WEIGHT BEARING RESTRICTIONS:  Yes NWB L UE  FALLS:  Has patient fallen in last 6 months? Yes. Number of falls 1  LIVING ENVIRONMENT: Lives with: lives with their family Lives in: House/apartment Stairs: Yes: Internal: 11 steps; on left going up and External: 3 steps; on right going up Has following equipment at home: None  OCCUPATION: Multimedia programmer, on computer  PLOF: Independent  PATIENT GOALS:to be able to participate in golfing, activities, with family, use L arm for dressing, bathing, I   OBJECTIVE:   DIAGNOSTIC FINDINGS:  Images available in epic chart review. Has plate and 8 screws L prox humerus  PATIENT SURVEYS:  Quick Dash 79.5%  COGNITION: Overall cognitive status: Within functional limits for tasks assessed     SENSATION: WFL  POSTURE: Soft sling L shoulder. Rounding L shoulder  UPPER EXTREMITY ROM: PROM L shoulder flexion 70, abd 80, ER -18 R shoulder wfl  Passive ROM Left eval L 11/29/22 AAROM with wand L 12/16/22  L AROM 12/21/22  L AROM 12/30/22  Shoulder flexion 70 105 122 130 142  Shoulder extension       Shoulder abduction 80 115 130 112 130  Shoulder adduction       Shoulder internal rotation    L PSIS   Shoulder external rotation -18 10 17  L habd behind base of neck 28  Elbow flexion       Elbow extension       Wrist flexion       Wrist extension       Wrist ulnar deviation       Wrist radial deviation       Wrist pronation       Wrist supination        (Blank rows = not tested)  UPPER EXTREMITY MMT: L UE MMT not performed due to recent injury , restrictions. Did note active contraction of all shoulder musculature and elbow, wrist, hand musculature  R hand and wrist deficits with grasp strength due to previous traumatic injury 12/30/22: L shoulder flex 3+ L shoulder abd 3+ L shoulder ER 4 L shoulder IR 4  SHOULDER SPECIAL TESTS: NA due to orthopedic restrictions   JOINT MOBILITY TESTING:  na  PALPATION:  Mild undulation L lateral upper arm mild edema  L upper arm    TODAY'S TREATMENT:  DATE: 01/04/23: Therapeutic exercise:  R side lying for deep cross friction massage L post shoulder capsule, with massage gun. Deep pressure, cross friction massage L pecs, biceps long head and short head attachments   Therex combined with manual techniques to address movement patterns and restriction L shoulder:  Reclined for 3 bouts of inferior glides L humerus at 90 degrees abd, 60 sec each to improve post/ inf capsular tightness Supine AAROM, stretching all planes L shoulder, with distraction L GH jt. R side lying for open books with 2 #, therapist providing downward stabilization L scapula, mass practice R  side lying for L shoulder ER 2#, 3 x 10 Supine for L shoulder abduction stretch, therapist providing medial scapular glides L  Prone L shoulder ext 6# 3 x 10 Prone L shoulder horizontal abd 2# Prone PA glides mid thoracic region  Moist heat L shoulder after session for post ex soreness 8 min  12/30/22:  therapeutic exercise: combined with manual techniques to address movement patterns and restriction L shoulder:  UBE 2.5 F, 2.5 B level 2  Reclined for 3 bouts of inferior glides L humerus at 90 degrees abd, 60 sec each to improve post/ inf capsular tightness Supine AAROM, stretching all planes L shoulder, with distraction L GH jt. R side lying for open books with 2 #, therapist providing downward stabilization L scapula, mass practice R  side lying for L shoulder ER 2#, 3 x 10 R side lying with L shoulder abduction stretch, therapist providing medial scapular glides L  Prone horizontal abd 2# Prone Y's no wt, with therapist providing PA glide thoracic region and depression L scapula Standing for self traction L humerus by gripping table and rocking backward to improve flexion Rom L   Moist heat L shoulder after  session for post ex soreness 8 min   12/28/22: Therapeutic exercise and manual techniques, combined to improve L GH and scapulothoracic jt mobility and inferior, post capsular restrictions: UBE 2.5 F, 2.5 B level 2  Semi reclined for 3 bouts of inferior glides L humerus at 90 degrees abd, 60 sec each to improve post/ inf capsular tightness Supine AAROM, stretching all planes L shoulder, with distraction L GH jt. R side lying for open books with 2 #, therapist providing downward stabilization L scapula, mass practice R side lying for L shoulder ER with towel axilla, 2# x 20 reps, cues to perform quickly for deep stabilization Prone for horizontal L shoulder abd with 2#,  Prone L shoulder ext with 6# Prone supraspinatus lifts - Y lifts, no wt Seated with L upper arm propped on table for ER stretch with 6# wt  After session moist heat L shoulder  12/21/22: Therapeutic exercise and manual techniques, combined to improve L GH and scapulothoracic jt mobility and inferior, post capsular restrictions: UBE L2 2.66F ,2.5B Semi reclined for inferior glides L GH jt x 60 sec bouts x 3 Manual stretching in supine L shoulder all planes, stretching L shoulder ER with humerus adducted, at 60 degrees abd and at 90 degrees abd, therapist provided inferior glide L scapula to prevent excessive firing L upper traps multiple reps Supine isometric shoulder abd sustained with B shoulder flexion 15 reps Supine fast B shoulder ER with red t band 30x Side lying R  for thoracic open books, therapist providing lateral pressure scapula to isolate stretch post capsule Side lying L shoulder abd with distraction of L shoulder/ GH jt   Moist heat L shoulder after session for post  ex soreness 8 min   12/16/22 Pulleys: 3 min flexion, 3 min scaption Standing L shoulder ER with wand x 10 Standing isometric shoulder IR YTB asistaning into ER 10x5" Passive stretching into ER with prolonged holds Contract and relax into ER   Supine L shoulder ER with wand towel under elbow S/L L shoulder horizontal ABD x 10   12/14/22:  UBE, lowest resistance, x 2 1/2 F, 2/1/2 B Supine for manual stretching, AAROM by PT for ER, flex, all planes, overpressure end range. Supine serratus punches 20x Supine L shoulder ER , stretch with green t band, then concentric pull into IR , multiple reps Supine red t band ER , short bursts, low amplitude, high speed with control emphasized, mass practice to fatigue Side lying R for L shoulder abduction, with therapist applying medial scapular glide to isolate inferior axilla Sidelying scapular punches to ceiling, 15 x Side lying on R for open books, therapist assisting  Standing wall slides with hand in pillow case into flex, scaption Standing B shoulder forward flexion hands in pillow case to engage scapular retractors Shoulder pulley 5 min Wall push ups 10 x   Moist heat L shoulder after session , 10 min 12/02/22:  Pulleys 5 min Seated with towel under L axilla for wand ER, 10x  Various supine stretching therex, performed with intermittent light GH distraction and shaking to relieve pain Supine for therapist assisted L serratus punches Supine for therapist assisted L Pec flys to stretch L ant chest Supine sustained stretch into ER with red theraband in palm, tolerated 1 1/2 min R Side lying for scapular mobs , lat glides Side lying therapist assisted punches to ceiling  R side lying shoulder abduction stretch R sidelying for L shoulder ER assisted.  Supine thoracic spine stretch with 1/2 foam roller along thoracic spine to open L ant chest wall  Moist heat ant and post shoulder at end of session, not billed  11/29/22 Seated swiffer AAROM slides into flexion x 15; scaption x 15 Pulleys x 3 min flexion; x 3 min scaption Supine wand flexion x 10  Supine wand ER x 10  Measured ROM  Vasopnuematic: game ready 10 min at end of session L shoulder in sitting. 11/25/22 Therapeutic  exercise: to improve flexibility L shoulder Supine PROM/ AAROM L shoulder mass practice  Added supine cane ER 5 sec holds, 10 x Added supine cane scapular punches therapist with light support L arm to maintain vertical position Supine cane flexion assisted from 90 to 100, 5 reps R side lying for scapular clocks, therapist providing tactile cues with hands Standing table slides with basketball on counter flex, scaption 10x Kinesiotape L shoulder, 2- I pieces , one from mid delts to middle traps, one from lateral delts to L upper traps, 35% pull  Vasopnuematic: game ready 10 min at end of session L shoulder in sitting.  11/22/22 MANUAL THERAPY: To promote normalized muscle tension, improved flexibility, improved joint mobility, increased ROM, and reduced pain.  STM/DTM and manual TPR to L UT, LS, rhomboids, middle & lower traps, lats  THERAPEUTIC EXERCISE: to improve flexibility, strength and mobility.  Demonstration, verbal and tactile cues throughout for technique. Pulleys: Flexion x 3 min Seated L AA flexion with swiffer x 10 Seated L AA scaption with swiffer x 10 Seated L AA circles with swiffer x 10  PROM to L shoulder MODALITIES: Game Ready vasopneumatic compression post session to L shoulder x 10 min, low compression, 34 to reduce post-exercise pain  and swelling/edema  11/15/22 THERAPEUTIC EXERCISE: to improve flexibility, strength and mobility.  Demonstration, verbal and tactile cues throughout for technique. Pulleys: Flexion x 3 min B rhomboid (lower cervical/upper thoracic) stretch 3 x 30" - hands supported on Swiffer handle L UT stretch 3 x 30" L LS stretch 3 x 30" L shoulder flexion AAROM - holding onto Swiffer handle (as UE ranger) x 10 L shoulder scaption AAROM - holding onto Swiffer handle (as UE ranger) x 10 L shoulder pendulums - flexion/extension, horiz ABD/ADD, CW/CCW x 30" each   MANUAL THERAPY: To promote normalized muscle tension, improved flexibility, improved  joint mobility, increased ROM, and reduced pain.  STM/DTM and manual TPR to L UT, LS, rhomboids, middle & lower traps, infraspinatus L shoulder gentle distraction +/- CW/CCW oscillations to promote muscle relaxation L shoulder PROM to tolerance in supine  MODALITIES: Game Ready vasopneumatic compression post session to L shoulder x 10 min, low compression, 34 to reduce post-exercise pain and swelling/edema   11/11/22  Therapeutic Exercise: to improve strength and mobility.  Demo, verbal and tactile cues throughout for technique.  Table slides into flexion x 10  Table slides scaption x 10  Seated L UT 2x30 sec Seated L LS 2x30 sec Seated scap retraction 10x3" PROM to L shoulder flexion and scaption Manual Therapy: STM to L UT, LS, rhomboids  Vaso to L shoulder for edema 34 deg ;low compression x 10 min   11/09/22 Therapeutic Exercise: to improve strength and mobility.  Demo, verbal and tactile cues throughout for technique.  PROM within MD precautions Putty squeezes for hand and wrist x 10  Seated L UT stretch 2x30 sec Seated L LS stretch 2x30 sec Seated thoracic extension 10x3" Seated table slides into flexion PROM x 10    PATIENT EDUCATION: Education details: HEP review and HEP modification - use of Swiffer as alternative for table slides Person educated: Patient and Spouse Education method: Explanation, Demonstration, Tactile cues, and Verbal cues Education comprehension: verbalized understanding and returned demonstration  HOME EXERCISE PROGRAM: Access Code: [email or web address] URL: [email or web address] Date: 11/11/2022 Prepared by: Verta Ellen  Exercises - Seated Upper Trapezius Stretch  - 2 x daily - 7 x weekly - 2 sets - 2 reps - 30 sec hold - Gentle Levator Scapulae Stretch  - 2 x daily - 7 x weekly - 2 sets - 2 reps - 30 sec hold - Seated Thoracic Extension AROM  - 1 x daily - 7 x weekly - 2 sets - 10 reps - Seated Shoulder Flexion Towel Slide at Table  Top  - 1 x daily - 7 x weekly - 2 sets - 10 reps - Seated Shoulder Scaption Slide at Table Top with Forearm in Neutral  - 1 x daily - 7 x weekly - 2 sets - 10 reps - Seated Scapular Retraction  - 1 x daily - 7 x weekly - 2 sets - 10 reps   ASSESSMENT:  CLINICAL IMPRESSION: Continued with AAROM exercises and stretching to improve functional ER of L shoulder.  Having some neural hypersensitivity ulnar nerve distribution. Discussed dry needling with him as well.  He is progressing with his goals, continues to need work on stretching for ER. Strength much improved. Nova will benefit from continued skilled PT to address above deficits to improve mobility and activity tolerance with decreased pain interference.  OBJECTIVE IMPAIRMENTS: decreased activity tolerance, decreased ROM, decreased strength, increased edema, increased fascial restrictions, impaired flexibility, and pain.   ACTIVITY LIMITATIONS: carrying, lifting, sleeping, bathing,  toileting, dressing, and reach over head  PARTICIPATION LIMITATIONS: meal prep, cleaning, laundry, driving, shopping, community activity, and yard work  PERSONAL FACTORS: Fitness, Past/current experiences, Time since onset of injury/illness/exacerbation, Transportation, and 1-2 comorbidities: psoriasis, LBP  are also affecting patient's functional outcome.   REHAB POTENTIAL: Good  CLINICAL DECISION MAKING: Stable/uncomplicated  EVALUATION COMPLEXITY: Low   GOALS: Goals reviewed with patient? Yes  SHORT TERM GOALS: Target date: 4 weeks, 12/02/22  ROM L shoulder flex , abd 90, ER 30 Baseline:flex 70, abd 80, Er -18 Goal status: IN PROGRESS- met for flexion- 11/29/22  LONG TERM GOALS: Target date: 12 weeks: 01/27/23  I HEP Baseline: TBD Goal status: Met for current HEP- 11/22/22  2.  L shoulder ROM for elevation 130 or greater for functional reach, ADL's Baseline:  Goal status: IN PROGRESS- 12/16/22 12/30/22 see charts above  3.  L shoulder ER 60 or  greater for functional reach, IR hand behind back to L 1 for dressing, bathing  Baseline:  Goal status: IN PROGRESS- 12/16/22 see charts above 12/30/22 progressing see above charts  4.  Quick DASH L shoulder 25% or less Baseline: 79.5% disability Goal status: IN PROGRESS  5.  Strength L shoulder 4+/5 all planes for ability to play golf, perform yard work, lifting Baseline:  Goal status: IN PROGRESS 12/30/22 see above MMT    PLAN:  PT FREQUENCY: 2x/week  PT DURATION: 12 weeks  PLANNED INTERVENTIONS: Therapeutic exercises, Therapeutic activity, Neuromuscular re-education, Balance training, Gait training, Patient/Family education, Self Care, and Joint mobilization  PLAN FOR NEXT SESSION: continue PROM and AAROM , try to engage with Er as much as pt can tolerate   Charlaine Utsey L Giankarlo Leamer, PT, DPT, OCS 01/04/2023, 11:26 AM

## 2023-01-06 ENCOUNTER — Ambulatory Visit: Payer: BC Managed Care – PPO

## 2023-01-06 ENCOUNTER — Other Ambulatory Visit: Payer: Self-pay

## 2023-01-06 DIAGNOSIS — M6281 Muscle weakness (generalized): Secondary | ICD-10-CM

## 2023-01-06 DIAGNOSIS — Z8781 Personal history of (healed) traumatic fracture: Secondary | ICD-10-CM

## 2023-01-06 DIAGNOSIS — M25612 Stiffness of left shoulder, not elsewhere classified: Secondary | ICD-10-CM

## 2023-01-06 DIAGNOSIS — M25512 Pain in left shoulder: Secondary | ICD-10-CM

## 2023-01-06 NOTE — Therapy (Signed)
OUTPATIENT PHYSICAL THERAPY TREATMENT     Patient Name: Isaiah Gibson MRN: [id number] DOB:[birth year], 37 y.o., male Today's Date: 01/06/2023  END OF SESSION:  PT End of Session - 01/06/23 1305     Visit Number 15    Date for PT Re-Evaluation 01/27/23    Progress Note Due on Visit 20    PT Start Time 1018    PT Stop Time 1100    PT Time Calculation (min) 42 min    Activity Tolerance Patient tolerated treatment well    Behavior During Therapy [facility] for tasks assessed/performed                   Past Medical History:  Diagnosis Date   ADD 10/20/2006   BACK PAIN 05/15/2007   GERD 10/20/2006   HEMATOCHEZIA 12/18/2009   INSOMNIA-SLEEP DISORDER-UNSPEC 05/15/2007   Palpitations 01/25/2008   POLYCYTHEMIA 05/20/2009   PSORIASIS 10/20/2006   SMOKER 05/15/2007   VENEREAL WART 05/15/2007   Past Surgical History:  Procedure Laterality Date   HAND SURGERY Right    ORIF SHOULDER FRACTURE Left 10/08/2022   Procedure: OPEN REDUCTION INTERNAL FIXATION (ORIF) GREATER TUBEROSITY FRACTURE;  Surgeon: Cammy Copa, MD;  Location: [facility] OR;  Service: Orthopedics;  Laterality: Left;  HANDY BED   WISDOM TOOTH EXTRACTION     Patient Active Problem List   Diagnosis Date Noted   Closed fracture dislocation of shoulder with routine healing 10/09/2022   Obesity (BMI 35.0-39.9 without comorbidity) 07/22/2022   Vitamin D deficiency 07/22/2022   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   Hematospermia 09/11/2020   Fatigue 09/27/2019   Erectile dysfunction 09/27/2019   Upper respiratory tract infection 09/27/2019   Screening-pulmonary TB 04/23/2017   Anxiety 04/14/2016   Panic attacks 04/14/2016   Weight gain 11/26/2015   Former smoker 11/26/2015   Pectoralis muscle strain 04/22/2015   Tendinitis of forearm 04/22/2015   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 04/16/2012   Dizziness 01/25/2011   Urinary frequency 01/25/2011   Bilateral leg edema 01/25/2011   Encounter for well adult  exam with abnormal findings 01/02/2011   HEMATOCHEZIA 12/18/2009   WRIST PAIN, RIGHT 12/09/2009   ANKLE PAIN, RIGHT 12/09/2009   POLYCYTHEMIA 05/20/2009   RASH-NONVESICULAR 09/04/2008   ANKLE SPRAIN, LEFT 05/31/2008   Palpitations 01/25/2008   VENEREAL WART 05/15/2007   SMOKER 05/15/2007   BACK PAIN 05/15/2007   INSOMNIA-SLEEP DISORDER-UNSPEC 05/15/2007   Attention deficit disorder 10/20/2006   GERD 10/20/2006   PSORIASIS 10/20/2006    PCP: Oliver Barre, MD  REFERRING PROVIDER: Julieanne Cotton, PA-C  REFERRING DIAG: L proximal humerus fx and dislocation, s/p ORIF  THERAPY DIAG:  History of left shoulder fracture  Stiffness of left shoulder, not elsewhere classified  Muscle weakness of left arm  Acute pain of left shoulder  Rationale for Evaluation and Treatment: Rehabilitation  ONSET DATE: 10/08/22  NEXT MD VISIT: 12/01/22   SUBJECTIVE:  SUBJECTIVE STATEMENT: Pt reports less tight L upper back.   Hand dominance: Right  PERTINENT HISTORY: Open reduction and internal fixation of left proximal humerus fracture.   PAIN:  Are you having pain? Yes: NPRS scale: 0/10 Pain location: L neck and shoulder into scapula  Pain description: stiff, aching, worse in am, when wakes up Aggravating factors: lying down Relieving factors: oxycodone, muscle relaxer  PRECAUTIONS: Other: As of 11/03/22 ortho PA notes: He is okay for full passive range of motion and okay to start active assisted range of motion.  No full active range of motion or rotator cuff strengthening exercises until cleared at his next appointment.  Follow-up in 4 weeks for clinical recheck with Dr. August Saucer.  RED FLAGS: None   WEIGHT BEARING RESTRICTIONS: Yes NWB L UE  FALLS:  Has patient fallen in last 6 months? Yes. Number of falls  1  LIVING ENVIRONMENT: Lives with: lives with their family Lives in: House/apartment Stairs: Yes: Internal: 11 steps; on left going up and External: 3 steps; on right going up Has following equipment at home: None  OCCUPATION: Multimedia programmer, on computer  PLOF: Independent  PATIENT GOALS:to be able to participate in golfing, activities, with family, use L arm for dressing, bathing, I   OBJECTIVE:   DIAGNOSTIC FINDINGS:  Images available in epic chart review. Has plate and 8 screws L prox humerus  PATIENT SURVEYS:  Quick Dash 79.5%  COGNITION: Overall cognitive status: Within functional limits for tasks assessed     SENSATION: WFL  POSTURE: Soft sling L shoulder. Rounding L shoulder  UPPER EXTREMITY ROM: PROM L shoulder flexion 70, abd 80, ER -18 R shoulder wfl  Passive ROM Left eval L 11/29/22 AAROM with wand L 12/16/22  L AROM 12/21/22  L AROM 12/30/22 L AAROM 01/06/23  Shoulder flexion 70 105 122 130 142   Shoulder extension        Shoulder abduction 80 115 130 112 130   Shoulder adduction        Shoulder internal rotation    L PSIS    Shoulder external rotation -18 10 17  L habd behind base of neck 28 39  Elbow flexion        Elbow extension        Wrist flexion        Wrist extension        Wrist ulnar deviation        Wrist radial deviation        Wrist pronation        Wrist supination         (Blank rows = not tested)  UPPER EXTREMITY MMT: L UE MMT not performed due to recent injury , restrictions. Did note active contraction of all shoulder musculature and elbow, wrist, hand musculature  R hand and wrist deficits with grasp strength due to previous traumatic injury 12/30/22: L shoulder flex 3+ L shoulder abd 3+ L shoulder ER 4 L shoulder IR 4  SHOULDER SPECIAL TESTS: NA due to orthopedic restrictions   JOINT MOBILITY TESTING:  na  PALPATION:  Mild undulation L lateral upper arm mild edema L upper arm    TODAY'S TREATMENT:  DATE: 01/06/23:  Therapeutic exercise: combined with manual techniques to address L scapular rhythm and centralization of humeral head.  Patient positioned in supine for stretching L shoulder into flex, abd, ER, all with varying GH jt distraction, also contract /relax at end range.  Side lying for L shoulder abduction with 3# wrist wt, with therapist manually depressing L scapula and stabilization of scapula against trunk. Sidelying L shoulder ER with 3 # Sidelying L shoulder open books with 3 # Side lying for theragun massage L post shoulder, middle and upper traps Prone for Y lifts L arm therapist provided some manual depression of L humeral head and stabilization L scapula to assist pt with achieving scaption movement, tends to horizontally abduct  8 min moist heat L shoulder post Rx  01/04/23: Therapeutic exercise:  R side lying for deep cross friction massage L post shoulder capsule, with massage gun. Deep pressure, cross friction massage L pecs, biceps long head and short head attachments   Therex combined with manual techniques to address movement patterns and restriction L shoulder:  Reclined for 3 bouts of inferior glides L humerus at 90 degrees abd, 60 sec each to improve post/ inf capsular tightness Supine AAROM, stretching all planes L shoulder, with distraction L GH jt. R side lying for open books with 2 #, therapist providing downward stabilization L scapula, mass practice R  side lying for L shoulder ER 2#, 3 x 10 Supine for L shoulder abduction stretch, therapist providing medial scapular glides L  Prone L shoulder ext 6# 3 x 10 Prone L shoulder horizontal abd 2# Prone PA glides mid thoracic region  Moist heat L shoulder after session for post ex soreness 8 min  12/30/22:  therapeutic ex ercise: combined with manual techniques to address movement  patterns and restriction L shoulder:  UBE 2.5 F, 2.5 B level 2  Reclined for 3 bouts of inferior glides L humerus at 90 degrees abd, 60 sec each to improve post/ inf capsular tightness Supine AAROM, stretching all planes L shoulder, with distraction L GH jt. R side lying for open books with 2 #, therapist providing downward stabilization L scapula, mass practice R  side lying for L shoulder ER 2#, 3 x 10 R side lying with L shoulder abduction stretch, therapist providing medial scapular glides L  Prone horizontal abd 2# Prone Y's no wt, with therapist providing PA glide thoracic region and depression L scapula Standing for self traction L humerus by gripping table and rocking backward to improve flexion Rom L   Moist heat L shoulder after session for post ex soreness 8 min   12/28/22: Therapeutic exercise and manual techniques, combined to improve L GH and scapulothoracic jt mobility and inferior, post capsular restrictions: UBE 2.5 F, 2.5 B level 2  Semi reclined for 3 bouts of inferior glides L humerus at 90 degrees abd, 60 sec each to improve post/ inf capsular tightness Supine AAROM, stretching all planes L shoulder, with distraction L GH jt. R side lying for open books with 2 #, therapist providing downward stabilization L scapula, mass practice R side lying for L shoulder ER with towel axilla, 2# x 20 reps, cues to perform quickly for deep stabilization Prone for horizontal L shoulder abd with 2#,  Prone L shoulder ext with 6# Prone supraspinatus lifts - Y lifts, no wt Seated with L upper arm propped on table for ER stretch with 6# wt  After session moist heat L shoulder  12/21/22: Therapeutic exercise and  manual techniques, combined to improve L GH and scapulothoracic jt mobility and inferior, post capsular restrictions: UBE L2 2.76F ,2.5B Semi reclined for inferior glides L GH jt x 60 sec bouts x 3 Manual stretching in supine L shoulder all planes, stretching L shoulder ER with  humerus adducted, at 60 degrees abd and at 90 degrees abd, therapist provided inferior glide L scapula to prevent excessive firing L upper traps multiple reps Supine isometric shoulder abd sustained with B shoulder flexion 15 reps Supine fast B shoulder ER with red t band 30x Side lying R  for thoracic open books, therapist providing lateral pressure scapula to isolate stretch post capsule Side lying L shoulder abd with distraction of L shoulder/ GH jt   Moist heat L shoulder after session for post ex soreness 8 min   12/16/22 Pulleys: 3 min flexion, 3 min scaption Standing L shoulder ER with wand x 10 Standing isometric shoulder IR YTB asistaning into ER 10x5" Passive stretching into ER with prolonged holds Contract and relax into ER  Supine L shoulder ER with wand towel under elbow S/L L shoulder horizontal ABD x 10   12/14/22:  UBE, lowest resistance, x 2 1/2 F, 2/1/2 B Supine for manual stretching, AAROM by PT for ER, flex, all planes, overpressure end range. Supine serratus punches 20x Supine L shoulder ER , stretch with green t band, then concentric pull into IR , multiple reps Supine red t band ER , short bursts, low amplitude, high speed with control emphasized, mass practice to fatigue Side lying R for L shoulder abduction, with therapist applying medial scapular glide to isolate inferior axilla Sidelying scapular punches to ceiling, 15 x Side lying on R for open books, therapist assisting  Standing wall slides with hand in pillow case into flex, scaption Standing B shoulder forward flexion hands in pillow case to engage scapular retractors Shoulder pulley 5 min Wall push ups 10 x   Moist heat L shoulder after session , 10 min 12/02/22:  Pulleys 5 min Seated with towel under L axilla for wand ER, 10x  Various supine stretching therex, performed with intermittent light GH distraction and shaking to relieve pain Supine for therapist assisted L serratus punches Supine for  therapist assisted L Pec flys to stretch L ant chest Supine sustained stretch into ER with red theraband in palm, tolerated 1 1/2 min R Side lying for scapular mobs , lat glides Side lying therapist assisted punches to ceiling  R side lying shoulder abduction stretch R sidelying for L shoulder ER assisted.  Supine thoracic spine stretch with 1/2 foam roller along thoracic spine to open L ant chest wall  Moist heat ant and post shoulder at end of session, not billed  11/29/22 Seated swiffer AAROM slides into flexion x 15; scaption x 15 Pulleys x 3 min flexion; x 3 min scaption Supine wand flexion x 10  Supine wand ER x 10  Measured ROM  Vasopnuematic: game ready 10 min at end of session L shoulder in sitting. 11/25/22 Therapeutic exercise: to improve flexibility L shoulder Supine PROM/ AAROM L shoulder mass practice  Added supine cane ER 5 sec holds, 10 x Added supine cane scapular punches therapist with light support L arm to maintain vertical position Supine cane flexion assisted from 90 to 100, 5 reps R side lying for scapular clocks, therapist providing tactile cues with hands Standing table slides with basketball on counter flex, scaption 10x Kinesiotape L shoulder, 2- I pieces , one from mid  delts to middle traps, one from lateral delts to L upper traps, 35% pull  Vasopnuematic: game ready 10 min at end of session L shoulder in sitting.  11/22/22 MANUAL THERAPY: To promote normalized muscle tension, improved flexibility, improved joint mobility, increased ROM, and reduced pain.  STM/DTM and manual TPR to L UT, LS, rhomboids, middle & lower traps, lats  THERAPEUTIC EXERCISE: to improve flexibility, strength and mobility.  Demonstration, verbal and tactile cues throughout for technique. Pulleys: Flexion x 3 min Seated L AA flexion with swiffer x 10 Seated L AA scaption with swiffer x 10 Seated L AA circles with swiffer x 10  PROM to L shoulder MODALITIES: Game Ready  vasopneumatic compression post session to L shoulder x 10 min, low compression, 34 to reduce post-exercise pain and swelling/edema  11/15/22 THERAPEUTIC EXERCISE: to improve flexibility, strength and mobility.  Demonstration, verbal and tactile cues throughout for technique. Pulleys: Flexion x 3 min B rhomboid (lower cervical/upper thoracic) stretch 3 x 30" - hands supported on Swiffer handle L UT stretch 3 x 30" L LS stretch 3 x 30" L shoulder flexion AAROM - holding onto Swiffer handle (as UE ranger) x 10 L shoulder scaption AAROM - holding onto Swiffer handle (as UE ranger) x 10 L shoulder pendulums - flexion/extension, horiz ABD/ADD, CW/CCW x 30" each   MANUAL THERAPY: To promote normalized muscle tension, improved flexibility, improved joint mobility, increased ROM, and reduced pain.  STM/DTM and manual TPR to L UT, LS, rhomboids, middle & lower traps, infraspinatus L shoulder gentle distraction +/- CW/CCW oscillations to promote muscle relaxation L shoulder PROM to tolerance in supine  MODALITIES: Game Ready vasopneumatic compression post session to L shoulder x 10 min, low compression, 34 to reduce post-exercise pain and swelling/edema   11/11/22  Therapeutic Exercise: to improve strength and mobility.  Demo, verbal and tactile cues throughout for technique.  Table slides into flexion x 10  Table slides scaption x 10  Seated L UT 2x30 sec Seated L LS 2x30 sec Seated scap retraction 10x3" PROM to L shoulder flexion and scaption Manual Therapy: STM to L UT, LS, rhomboids  Vaso to L shoulder for edema 34 deg ;low compression x 10 min   11/09/22 Therapeutic Exercise: to improve strength and mobility.  Demo, verbal and tactile cues throughout for technique.  PROM within MD precautions Putty squeezes for hand and wrist x 10  Seated L UT stretch 2x30 sec Seated L LS stretch 2x30 sec Seated thoracic extension 10x3" Seated table slides into flexion PROM x 10    PATIENT  EDUCATION: Education details: HEP review and HEP modification - use of Swiffer as alternative for table slides Person educated: Patient and Spouse Education method: Explanation, Demonstration, Tactile cues, and Verbal cues Education comprehension: verbalized understanding and returned demonstration  HOME EXERCISE PROGRAM: Access Code: [email or web address] URL: [email or web address] Date: 11/11/2022 Prepared by: Verta Ellen  Exercises - Seated Upper Trapezius Stretch  - 2 x daily - 7 x weekly - 2 sets - 2 reps - 30 sec hold - Gentle Levator Scapulae Stretch  - 2 x daily - 7 x weekly - 2 sets - 2 reps - 30 sec hold - Seated Thoracic Extension AROM  - 1 x daily - 7 x weekly - 2 sets - 10 reps - Seated Shoulder Flexion Towel Slide at Table Top  - 1 x daily - 7 x weekly - 2 sets - 10 reps - Seated Shoulder Scaption Slide at Table Top with Forearm in Neutral  -  1 x daily - 7 x weekly - 2 sets - 10 reps - Seated Scapular Retraction  - 1 x daily - 7 x weekly - 2 sets - 10 reps   ASSESSMENT:  CLINICAL IMPRESSION: Continued with AAROM exercises and stretching to improve functional ER of L shoulder.  He is progressing with his goals, improved measurement with L shoulder ER noted today.   Continues to need work on stretching for ER. Strength much improved. Jeryl will benefit from continued skilled PT to address above deficits to improve mobility and activity tolerance with decreased pain interference.  OBJECTIVE IMPAIRMENTS: decreased activity tolerance, decreased ROM, decreased strength, increased edema, increased fascial restrictions, impaired flexibility, and pain.   ACTIVITY LIMITATIONS: carrying, lifting, sleeping, bathing, toileting, dressing, and reach over head  PARTICIPATION LIMITATIONS: meal prep, cleaning, laundry, driving, shopping, community activity, and yard work  PERSONAL FACTORS: Fitness, Past/current experiences, Time since onset of injury/illness/exacerbation,  Transportation, and 1-2 comorbidities: psoriasis, LBP  are also affecting patient's functional outcome.   REHAB POTENTIAL: Good  CLINICAL DECISION MAKING: Stable/uncomplicated  EVALUATION COMPLEXITY: Low   GOALS: Goals reviewed with patient? Yes  SHORT TERM GOALS: Target date: 4 weeks, 12/02/22  ROM L shoulder flex , abd 90, ER 30 Baseline:flex 70, abd 80, Er -18 Goal status: IN PROGRESS- met for flexion- 11/29/22  LONG TERM GOALS: Target date: 12 weeks: 01/27/23  I HEP Baseline: TBD Goal status: Met for current HEP- 11/22/22  2.  L shoulder ROM for elevation 130 or greater for functional reach, ADL's Baseline:  Goal status: IN PROGRESS- 12/16/22 12/30/22 see charts above  3.  L shoulder ER 60 or greater for functional reach, IR hand behind back to L 1 for dressing, bathing  Baseline:  Goal status: IN PROGRESS- 12/16/22 see charts above 12/30/22 progressing see above charts  4.  Quick DASH L shoulder 25% or less Baseline: 79.5% disability Goal status: IN PROGRESS  5.  Strength L shoulder 4+/5 all planes for ability to play golf, perform yard work, lifting Baseline:  Goal status: IN PROGRESS 12/30/22 see above MMT    PLAN:  PT FREQUENCY: 2x/week  PT DURATION: 12 weeks  PLANNED INTERVENTIONS: Therapeutic exercises, Therapeutic activity, Neuromuscular re-education, Balance training, Gait training, Patient/Family education, Self Care, and Joint mobilization  PLAN FOR NEXT SESSION: continue PROM and AAROM , try to engage with Er as much as pt can tolerate   Madine Sarr L Tyasia Packard, PT, DPT, OCS 01/06/2023, 1:07 PM

## 2023-01-10 ENCOUNTER — Other Ambulatory Visit: Payer: Self-pay | Admitting: Internal Medicine

## 2023-01-10 ENCOUNTER — Ambulatory Visit: Payer: BC Managed Care – PPO

## 2023-01-10 MED ORDER — AMPHETAMINE-DEXTROAMPHETAMINE 30 MG PO TABS: 30.0000 mg | ORAL_TABLET | Freq: Two times a day (BID) | ORAL | 0 refills | Status: DC

## 2023-01-11 ENCOUNTER — Other Ambulatory Visit: Payer: Self-pay

## 2023-01-11 ENCOUNTER — Ambulatory Visit: Payer: BC Managed Care – PPO

## 2023-01-11 DIAGNOSIS — Z8781 Personal history of (healed) traumatic fracture: Secondary | ICD-10-CM

## 2023-01-11 DIAGNOSIS — M25612 Stiffness of left shoulder, not elsewhere classified: Secondary | ICD-10-CM

## 2023-01-11 DIAGNOSIS — M25512 Pain in left shoulder: Secondary | ICD-10-CM

## 2023-01-11 DIAGNOSIS — M6281 Muscle weakness (generalized): Secondary | ICD-10-CM

## 2023-01-11 NOTE — Therapy (Signed)
OUTPATIENT PHYSICAL THERAPY TREATMENT     Patient Name: Isaiah Gibson MRN: [id number] DOB:05-15-1985, 37 y.o., male Today's Date: 01/11/2023  END OF SESSION:  PT End of Session - 01/11/23 0809     Visit Number 16    Date for PT Re-Evaluation 01/27/23    PT Start Time 0804    PT Stop Time 0845    PT Time Calculation (min) 41 min    Activity Tolerance Patient tolerated treatment well    Behavior During Therapy [facility] for tasks assessed/performed                    Past Medical History:  Diagnosis Date   ADD 10/20/2006   BACK PAIN 05/15/2007   GERD 10/20/2006   HEMATOCHEZIA 12/18/2009   INSOMNIA-SLEEP DISORDER-UNSPEC 05/15/2007   Palpitations 01/25/2008   POLYCYTHEMIA 05/20/2009   PSORIASIS 10/20/2006   SMOKER 05/15/2007   VENEREAL WART 05/15/2007   Past Surgical History:  Procedure Laterality Date   HAND SURGERY Right    ORIF SHOULDER FRACTURE Left 10/08/2022   Procedure: OPEN REDUCTION INTERNAL FIXATION (ORIF) GREATER TUBEROSITY FRACTURE;  Surgeon: Cammy Copa, MD;  Location: [facility] OR;  Service: Orthopedics;  Laterality: Left;  HANDY BED   WISDOM TOOTH EXTRACTION     Patient Active Problem List   Diagnosis Date Noted   Closed fracture dislocation of shoulder with routine healing 10/09/2022   Obesity (BMI 35.0-39.9 without comorbidity) 07/22/2022   Vitamin D deficiency 07/22/2022   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   Hematospermia 09/11/2020   Fatigue 09/27/2019   Erectile dysfunction 09/27/2019   Upper respiratory tract infection 09/27/2019   Screening-pulmonary TB 04/23/2017   Anxiety 04/14/2016   Panic attacks 04/14/2016   Weight gain 11/26/2015   Former smoker 11/26/2015   Pectoralis muscle strain 04/22/2015   Tendinitis of forearm 04/22/2015   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 04/16/2012   Dizziness 01/25/2011   Urinary frequency 01/25/2011   Bilateral leg edema 01/25/2011   Encounter for well adult exam with abnormal findings  01/02/2011   HEMATOCHEZIA 12/18/2009   WRIST PAIN, RIGHT 12/09/2009   ANKLE PAIN, RIGHT 12/09/2009   POLYCYTHEMIA 05/20/2009   RASH-NONVESICULAR 09/04/2008   ANKLE SPRAIN, LEFT 05/31/2008   Palpitations 01/25/2008   VENEREAL WART 05/15/2007   SMOKER 05/15/2007   BACK PAIN 05/15/2007   INSOMNIA-SLEEP DISORDER-UNSPEC 05/15/2007   Attention deficit disorder 10/20/2006   GERD 10/20/2006   PSORIASIS 10/20/2006    PCP: Oliver Barre, MD  REFERRING PROVIDER: Julieanne Cotton, PA-C  REFERRING DIAG: L proximal humerus fx and dislocation, s/p ORIF  THERAPY DIAG:  History of left shoulder fracture  Stiffness of left shoulder, not elsewhere classified  Muscle weakness of left arm  Acute pain of left shoulder  Rationale for Evaluation and Treatment: Rehabilitation  ONSET DATE: 10/08/22  NEXT MD VISIT: 12/01/22   SUBJECTIVE:  SUBJECTIVE STATEMENT: Pt reports  dropped his phone in car and had to reach between the seats with his L hand and had a lot of cramping, locking L post shoulder/scapular retractors  Hand dominance: Right  PERTINENT HISTORY: Open reduction and internal fixation of left proximal humerus fracture.   PAIN:  Are you having pain? Yes: NPRS scale: 0/10 Pain location: L neck and shoulder into scapula  Pain description: stiff, aching, worse in am, when wakes up Aggravating factors: lying down Relieving factors: oxycodone, muscle relaxer  PRECAUTIONS: Other: As of 11/03/22 ortho PA notes: He is okay for full passive range of motion and okay to start active assisted range of motion.  No full active range of motion or rotator cuff strengthening exercises until cleared at his next appointment.  Follow-up in 4 weeks for clinical recheck with Dr. August Saucer.  RED FLAGS: None   WEIGHT BEARING  RESTRICTIONS: Yes NWB L UE  FALLS:  Has patient fallen in last 6 months? Yes. Number of falls 1  LIVING ENVIRONMENT: Lives with: lives with their family Lives in: House/apartment Stairs: Yes: Internal: 11 steps; on left going up and External: 3 steps; on right going up Has following equipment at home: None  OCCUPATION: Multimedia programmer, on computer  PLOF: Independent  PATIENT GOALS:to be able to participate in golfing, activities, with family, use L arm for dressing, bathing, I   OBJECTIVE:   DIAGNOSTIC FINDINGS:  Images available in epic chart review. Has plate and 8 screws L prox humerus  PATIENT SURVEYS:  Quick Dash 79.5%  COGNITION: Overall cognitive status: Within functional limits for tasks assessed     SENSATION: WFL  POSTURE: Soft sling L shoulder. Rounding L shoulder  UPPER EXTREMITY ROM: PROM L shoulder flexion 70, abd 80, ER -18 R shoulder wfl  Passive ROM Left eval L 11/29/22 AAROM with wand L 12/16/22  L AROM 12/21/22  L AROM 12/30/22 L AAROM 01/06/23  Shoulder flexion 70 105 122 130 142   Shoulder extension        Shoulder abduction 80 115 130 112 130   Shoulder adduction        Shoulder internal rotation    L PSIS    Shoulder external rotation -18 10 17  L habd behind base of neck 28 39  Elbow flexion        Elbow extension        Wrist flexion        Wrist extension        Wrist ulnar deviation        Wrist radial deviation        Wrist pronation        Wrist supination         (Blank rows = not tested)  UPPER EXTREMITY MMT: L UE MMT not performed due to recent injury , restrictions. Did note active contraction of all shoulder musculature and elbow, wrist, hand musculature  R hand and wrist deficits with grasp strength due to previous traumatic injury 12/30/22: L shoulder flex 3+ L shoulder abd 3+ L shoulder ER 4 L shoulder IR 4  SHOULDER SPECIAL TESTS: NA due to orthopedic restrictions   JOINT MOBILITY TESTING:  na  PALPATION:   Mild undulation L lateral upper arm mild edema L upper arm    TODAY'S TREATMENT:  DATE: 01/11/23: Therapeutic exercise: combined with manual techniques to address L scapular rhythm and centralization of humeral head.  Patient positioned in supine for stretching L shoulder into flex, abd, ER, all with varying GH jt distraction, also contract /relax at end range Supine inf glides humeral head with shoulder in 80 degrees abd, 3 bouts 45 sec Supine for stretching with 3 # wt into horizontal abduction, at 60 degrees, 90 degrees, 110 degrees, with pec fly, therapist lightly supporting under arm protect with stretch, mas practice Side lying on R for open books, 3# cuff wt 15 reps, therapist manually stabilizing /depressing scapula Side lying for L shoulder abd with therapist distracting humeral head and performing post glide stretch mas practice Massage gun for deep pressure, cross friction mass L post shoulder capsule, scapular retractors  BATCA shoulder rows 2 x 10 hands vertical and hands horizontal, with 25# BATCA shoulder press 20#, 3 x 10  Moist heat L shoulder after session, not included in treatment  01/06/23:  Therapeutic exercise: combined with manual techniques to address L scapular rhythm and centralization of humeral head.  Patient positioned in supine for stretching L shoulder into flex, abd, ER, all with varying GH jt distraction, also contract /relax at end range.  Side lying for L shoulder abduction with 3# wrist wt, with therapist manually depressing L scapula and stabilization of scapula against trunk. Sidelying L shoulder ER with 3 # Sidelying L shoulder open books with 3 # Side lying for theragun massage L post shoulder, middle and upper traps Prone for Y lifts L arm therapist provided some manual depression of L humeral head and  stabilization L scapula to assist pt with achieving scaption movement, tends to horizontally abduct  8 min moist heat L shoulder post Rx  01/04/23: Therapeutic exercise:  R side lying for deep cross friction massage L post shoulder capsule, with massage gun. Deep pressure, cross friction massage L pecs, biceps long head and short head attachments   Therex combined with manual techniques to address movement patterns and restriction L shoulder:  Reclined for 3 bouts of inferior glides L humerus at 90 degrees abd, 60 sec each to improve post/ inf capsular tightness Supine AAROM, stretching all planes L shoulder, with distraction L GH jt. R side lying for open books with 2 #, therapist providing downward stabilization L scapula, mass practice R  side lying for L shoulder ER 2#, 3 x 10 Supine for L shoulder abduction stretch, therapist providing medial scapular glides L  Prone L shoulder ext 6# 3 x 10 Prone L shoulder horizontal abd 2# Prone PA glides mid thoracic region  Moist heat L shoulder after session for post ex soreness 8 min  12/30/22:  therapeutic ex ercise: combined with manual techniques to address movement patterns and restriction L shoulder:  UBE 2.5 F, 2.5 B level 2  Reclined for 3 bouts of inferior glides L humerus at 90 degrees abd, 60 sec each to improve post/ inf capsular tightness Supine AAROM, stretching all planes L shoulder, with distraction L GH jt. R side lying for open books with 2 #, therapist providing downward stabilization L scapula, mass practice R  side lying for L shoulder ER 2#, 3 x 10 R side lying with L shoulder abduction stretch, therapist providing medial scapular glides L  Prone horizontal abd 2# Prone Y's no wt, with therapist providing PA glide thoracic region and depression L scapula Standing for self traction L humerus by gripping table and rocking backward to improve  flexion Rom L   Moist heat L shoulder after session for post ex soreness 8  min   12/28/22: Therapeutic exercise and manual techniques, combined to improve L GH and scapulothoracic jt mobility and inferior, post capsular restrictions: UBE 2.5 F, 2.5 B level 2  Semi reclined for 3 bouts of inferior glides L humerus at 90 degrees abd, 60 sec each to improve post/ inf capsular tightness Supine AAROM, stretching all planes L shoulder, with distraction L GH jt. R side lying for open books with 2 #, therapist providing downward stabilization L scapula, mass practice R side lying for L shoulder ER with towel axilla, 2# x 20 reps, cues to perform quickly for deep stabilization Prone for horizontal L shoulder abd with 2#,  Prone L shoulder ext with 6# Prone supraspinatus lifts - Y lifts, no wt Seated with L upper arm propped on table for ER stretch with 6# wt  After session moist heat L shoulder  12/21/22: Therapeutic exercise and manual techniques, combined to improve L GH and scapulothoracic jt mobility and inferior, post capsular restrictions: UBE L2 2.23F ,2.5B Semi reclined for inferior glides L GH jt x 60 sec bouts x 3 Manual stretching in supine L shoulder all planes, stretching L shoulder ER with humerus adducted, at 60 degrees abd and at 90 degrees abd, therapist provided inferior glide L scapula to prevent excessive firing L upper traps multiple reps Supine isometric shoulder abd sustained with B shoulder flexion 15 reps Supine fast B shoulder ER with red t band 30x Side lying R  for thoracic open books, therapist providing lateral pressure scapula to isolate stretch post capsule Side lying L shoulder abd with distraction of L shoulder/ GH jt   Moist heat L shoulder after session for post ex soreness 8 min   12/16/22 Pulleys: 3 min flexion, 3 min scaption Standing L shoulder ER with wand x 10 Standing isometric shoulder IR YTB asistaning into ER 10x5" Passive stretching into ER with prolonged holds Contract and relax into ER  Supine L shoulder ER with wand  towel under elbow S/L L shoulder horizontal ABD x 10   12/14/22:  UBE, lowest resistance, x 2 1/2 F, 2/1/2 B Supine for manual stretching, AAROM by PT for ER, flex, all planes, overpressure end range. Supine serratus punches 20x Supine L shoulder ER , stretch with green t band, then concentric pull into IR , multiple reps Supine red t band ER , short bursts, low amplitude, high speed with control emphasized, mass practice to fatigue Side lying R for L shoulder abduction, with therapist applying medial scapular glide to isolate inferior axilla Sidelying scapular punches to ceiling, 15 x Side lying on R for open books, therapist assisting  Standing wall slides with hand in pillow case into flex, scaption Standing B shoulder forward flexion hands in pillow case to engage scapular retractors Shoulder pulley 5 min Wall push ups 10 x   Moist heat L shoulder after session , 10 min 12/02/22:  Pulleys 5 min Seated with towel under L axilla for wand ER, 10x  Various supine stretching therex, performed with intermittent light GH distraction and shaking to relieve pain Supine for therapist assisted L serratus punches Supine for therapist assisted L Pec flys to stretch L ant chest Supine sustained stretch into ER with red theraband in palm, tolerated 1 1/2 min R Side lying for scapular mobs , lat glides Side lying therapist assisted punches to ceiling  R side lying shoulder abduction stretch R  sidelying for L shoulder ER assisted.  Supine thoracic spine stretch with 1/2 foam roller along thoracic spine to open L ant chest wall  Moist heat ant and post shoulder at end of session, not billed  11/29/22 Seated swiffer AAROM slides into flexion x 15; scaption x 15 Pulleys x 3 min flexion; x 3 min scaption Supine wand flexion x 10  Supine wand ER x 10  Measured ROM  Vasopnuematic: game ready 10 min at end of session L shoulder in sitting. 11/25/22 Therapeutic exercise: to improve flexibility L  shoulder Supine PROM/ AAROM L shoulder mass practice  Added supine cane ER 5 sec holds, 10 x Added supine cane scapular punches therapist with light support L arm to maintain vertical position Supine cane flexion assisted from 90 to 100, 5 reps R side lying for scapular clocks, therapist providing tactile cues with hands Standing table slides with basketball on counter flex, scaption 10x Kinesiotape L shoulder, 2- I pieces , one from mid delts to middle traps, one from lateral delts to L upper traps, 35% pull  Vasopnuematic: game ready 10 min at end of session L shoulder in sitting.  11/22/22 MANUAL THERAPY: To promote normalized muscle tension, improved flexibility, improved joint mobility, increased ROM, and reduced pain.  STM/DTM and manual TPR to L UT, LS, rhomboids, middle & lower traps, lats  THERAPEUTIC EXERCISE: to improve flexibility, strength and mobility.  Demonstration, verbal and tactile cues throughout for technique. Pulleys: Flexion x 3 min Seated L AA flexion with swiffer x 10 Seated L AA scaption with swiffer x 10 Seated L AA circles with swiffer x 10  PROM to L shoulder MODALITIES: Game Ready vasopneumatic compression post session to L shoulder x 10 min, low compression, 34 to reduce post-exercise pain and swelling/edema  11/15/22 THERAPEUTIC EXERCISE: to improve flexibility, strength and mobility.  Demonstration, verbal and tactile cues throughout for technique. Pulleys: Flexion x 3 min B rhomboid (lower cervical/upper thoracic) stretch 3 x 30" - hands supported on Swiffer handle L UT stretch 3 x 30" L LS stretch 3 x 30" L shoulder flexion AAROM - holding onto Swiffer handle (as UE ranger) x 10 L shoulder scaption AAROM - holding onto Swiffer handle (as UE ranger) x 10 L shoulder pendulums - flexion/extension, horiz ABD/ADD, CW/CCW x 30" each   MANUAL THERAPY: To promote normalized muscle tension, improved flexibility, improved joint mobility, increased ROM, and  reduced pain.  STM/DTM and manual TPR to L UT, LS, rhomboids, middle & lower traps, infraspinatus L shoulder gentle distraction +/- CW/CCW oscillations to promote muscle relaxation L shoulder PROM to tolerance in supine  MODALITIES: Game Ready vasopneumatic compression post session to L shoulder x 10 min, low compression, 34 to reduce post-exercise pain and swelling/edema   11/11/22  Therapeutic Exercise: to improve strength and mobility.  Demo, verbal and tactile cues throughout for technique.  Table slides into flexion x 10  Table slides scaption x 10  Seated L UT 2x30 sec Seated L LS 2x30 sec Seated scap retraction 10x3" PROM to L shoulder flexion and scaption Manual Therapy: STM to L UT, LS, rhomboids  Vaso to L shoulder for edema 34 deg ;low compression x 10 min   11/09/22 Therapeutic Exercise: to improve strength and mobility.  Demo, verbal and tactile cues throughout for technique.  PROM within MD precautions Putty squeezes for hand and wrist x 10  Seated L UT stretch 2x30 sec Seated L LS stretch 2x30 sec Seated thoracic extension 10x3" Seated table  slides into flexion PROM x 10    PATIENT EDUCATION: Education details: HEP review and HEP modification - use of Swiffer as alternative for table slides Person educated: Patient and Spouse Education method: Explanation, Demonstration, Tactile cues, and Verbal cues Education comprehension: verbalized understanding and returned demonstration  HOME EXERCISE PROGRAM: Access Code: [email or web address] URL: [email or web address] Date: 11/11/2022 Prepared by: Verta Ellen  Exercises - Seated Upper Trapezius Stretch  - 2 x daily - 7 x weekly - 2 sets - 2 reps - 30 sec hold - Gentle Levator Scapulae Stretch  - 2 x daily - 7 x weekly - 2 sets - 2 reps - 30 sec hold - Seated Thoracic Extension AROM  - 1 x daily - 7 x weekly - 2 sets - 10 reps - Seated Shoulder Flexion Towel Slide at Table Top  - 1 x daily - 7 x weekly - 2  sets - 10 reps - Seated Shoulder Scaption Slide at Table Top with Forearm in Neutral  - 1 x daily - 7 x weekly - 2 sets - 10 reps - Seated Scapular Retraction  - 1 x daily - 7 x weekly - 2 sets - 10 reps   ASSESSMENT:  CLINICAL IMPRESSION: Continued with AAROM exercises and stretching to improve functional ER , abd, motion of L shoulder.   Strength continues to be much improved, as well as overall movement in all planes L shoulder.   Man will benefit from continued skilled PT to address above deficits to improve mobility and activity tolerance with decreased pain interference.  OBJECTIVE IMPAIRMENTS: decreased activity tolerance, decreased ROM, decreased strength, increased edema, increased fascial restrictions, impaired flexibility, and pain.   ACTIVITY LIMITATIONS: carrying, lifting, sleeping, bathing, toileting, dressing, and reach over head  PARTICIPATION LIMITATIONS: meal prep, cleaning, laundry, driving, shopping, community activity, and yard work  PERSONAL FACTORS: Fitness, Past/current experiences, Time since onset of injury/illness/exacerbation, Transportation, and 1-2 comorbidities: psoriasis, LBP  are also affecting patient's functional outcome.   REHAB POTENTIAL: Good  CLINICAL DECISION MAKING: Stable/uncomplicated  EVALUATION COMPLEXITY: Low   GOALS: Goals reviewed with patient? Yes  SHORT TERM GOALS: Target date: 4 weeks, 12/02/22  ROM L shoulder flex , abd 90, ER 30 Baseline:flex 70, abd 80, Er -18 Goal status: IN PROGRESS- met for flexion- 11/29/22  LONG TERM GOALS: Target date: 12 weeks: 01/27/23  I HEP Baseline: TBD Goal status: Met for current HEP- 11/22/22  2.  L shoulder ROM for elevation 130 or greater for functional reach, ADL's Baseline:  Goal status: IN PROGRESS- 12/16/22 12/30/22 see charts above  3.  L shoulder ER 60 or greater for functional reach, IR hand behind back to L 1 for dressing, bathing  Baseline:  Goal status: IN PROGRESS- 12/16/22  see charts above 12/30/22 progressing see above charts  4.  Quick DASH L shoulder 25% or less Baseline: 79.5% disability Goal status: IN PROGRESS  5.  Strength L shoulder 4+/5 all planes for ability to play golf, perform yard work, lifting Baseline:  Goal status: IN PROGRESS 12/30/22 see above MMT    PLAN:  PT FREQUENCY: 2x/week  PT DURATION: 12 weeks  PLANNED INTERVENTIONS: Therapeutic exercises, Therapeutic activity, Neuromuscular re-education, Balance training, Gait training, Patient/Family education, Self Care, and Joint mobilization  PLAN FOR NEXT SESSION: continue PROM and AAROM , try to engage with Er as much as pt can tolerate, progress with resistance, add more rhythmic stabilization   Maryan Sivak L Conlin Brahm, PT, DPT, OCS 01/11/2023, 8:20 AM

## 2023-01-13 ENCOUNTER — Ambulatory Visit: Payer: BC Managed Care – PPO

## 2023-01-18 ENCOUNTER — Other Ambulatory Visit: Payer: Self-pay

## 2023-01-18 ENCOUNTER — Ambulatory Visit: Payer: BC Managed Care – PPO

## 2023-01-18 DIAGNOSIS — Z8781 Personal history of (healed) traumatic fracture: Secondary | ICD-10-CM

## 2023-01-18 DIAGNOSIS — M6281 Muscle weakness (generalized): Secondary | ICD-10-CM

## 2023-01-18 DIAGNOSIS — M25612 Stiffness of left shoulder, not elsewhere classified: Secondary | ICD-10-CM

## 2023-01-18 DIAGNOSIS — M25512 Pain in left shoulder: Secondary | ICD-10-CM

## 2023-01-18 NOTE — Therapy (Signed)
OUTPATIENT PHYSICAL THERAPY TREATMENT     Patient Name: Isaiah Gibson MRN: [id number] DOB:05-05-1985, 37 y.o., male Today's Date: 01/18/2023  END OF SESSION:  PT End of Session - 01/18/23 1258     Visit Number 18    Date for PT Re-Evaluation 01/27/23    Progress Note Due on Visit 20    PT Start Time 1025    PT Stop Time 1110    PT Time Calculation (min) 45 min    Activity Tolerance Patient tolerated treatment well    Behavior During Therapy [facility] for tasks assessed/performed                     Past Medical History:  Diagnosis Date   ADD 10/20/2006   BACK PAIN 05/15/2007   GERD 10/20/2006   HEMATOCHEZIA 12/18/2009   INSOMNIA-SLEEP DISORDER-UNSPEC 05/15/2007   Palpitations 01/25/2008   POLYCYTHEMIA 05/20/2009   PSORIASIS 10/20/2006   SMOKER 05/15/2007   VENEREAL WART 05/15/2007   Past Surgical History:  Procedure Laterality Date   HAND SURGERY Right    ORIF SHOULDER FRACTURE Left 10/08/2022   Procedure: OPEN REDUCTION INTERNAL FIXATION (ORIF) GREATER TUBEROSITY FRACTURE;  Surgeon: Cammy Copa, MD;  Location: [facility] OR;  Service: Orthopedics;  Laterality: Left;  HANDY BED   WISDOM TOOTH EXTRACTION     Patient Active Problem List   Diagnosis Date Noted   Closed fracture dislocation of shoulder with routine healing 10/09/2022   Obesity (BMI 35.0-39.9 without comorbidity) 07/22/2022   Vitamin D deficiency 07/22/2022   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   Hematospermia 09/11/2020   Fatigue 09/27/2019   Erectile dysfunction 09/27/2019   Upper respiratory tract infection 09/27/2019   Screening-pulmonary TB 04/23/2017   Anxiety 04/14/2016   Panic attacks 04/14/2016   Weight gain 11/26/2015   Former smoker 11/26/2015   Pectoralis muscle strain 04/22/2015   Tendinitis of forearm 04/22/2015   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 04/16/2012   Dizziness 01/25/2011   Urinary frequency 01/25/2011   Bilateral leg edema 01/25/2011   Encounter for well adult  exam with abnormal findings 01/02/2011   HEMATOCHEZIA 12/18/2009   WRIST PAIN, RIGHT 12/09/2009   ANKLE PAIN, RIGHT 12/09/2009   POLYCYTHEMIA 05/20/2009   RASH-NONVESICULAR 09/04/2008   ANKLE SPRAIN, LEFT 05/31/2008   Palpitations 01/25/2008   VENEREAL WART 05/15/2007   SMOKER 05/15/2007   BACK PAIN 05/15/2007   INSOMNIA-SLEEP DISORDER-UNSPEC 05/15/2007   Attention deficit disorder 10/20/2006   GERD 10/20/2006   PSORIASIS 10/20/2006    PCP: Oliver Barre, MD  REFERRING PROVIDER: Julieanne Cotton, PA-C  REFERRING DIAG: L proximal humerus fx and dislocation, s/p ORIF  THERAPY DIAG:  History of left shoulder fracture  Stiffness of left shoulder, not elsewhere classified  Muscle weakness of left arm  Acute pain of left shoulder  Rationale for Evaluation and Treatment: Rehabilitation  ONSET DATE: 10/08/22  NEXT MD VISIT: 12/01/22   SUBJECTIVE:  SUBJECTIVE STATEMENT: Pt reports  using his L arm more frequently, carrying heavier items. Functionally limited with donning shirts, coat. A little more control with lifting L hand to mouth, not abducting as much.   Hand dominance: Right  PERTINENT HISTORY: Open reduction and internal fixation of left proximal humerus fracture.   PAIN:  Are you having pain? Yes: NPRS scale: 0/10 Pain location: L neck and shoulder into scapula  Pain description: stiff, aching, worse in am, when wakes up Aggravating factors: lying down Relieving factors: oxycodone, muscle relaxer  PRECAUTIONS: Other: As of 11/03/22 ortho PA notes: He is okay for full passive range of motion and okay to start active assisted range of motion.  No full active range of motion or rotator cuff strengthening exercises until cleared at his next appointment.  Follow-up in 4 weeks for clinical  recheck with Dr. August Saucer.  RED FLAGS: None   WEIGHT BEARING RESTRICTIONS: Yes NWB L UE  FALLS:  Has patient fallen in last 6 months? Yes. Number of falls 1  LIVING ENVIRONMENT: Lives with: lives with their family Lives in: House/apartment Stairs: Yes: Internal: 11 steps; on left going up and External: 3 steps; on right going up Has following equipment at home: None  OCCUPATION: Multimedia programmer, on computer  PLOF: Independent  PATIENT GOALS:to be able to participate in golfing, activities, with family, use L arm for dressing, bathing, I   OBJECTIVE:   DIAGNOSTIC FINDINGS:  Images available in epic chart review. Has plate and 8 screws L prox humerus  PATIENT SURVEYS:  Quick Dash 79.5%  COGNITION: Overall cognitive status: Within functional limits for tasks assessed     SENSATION: WFL  POSTURE: Soft sling L shoulder. Rounding L shoulder  UPPER EXTREMITY ROM: PROM L shoulder flexion 70, abd 80, ER -18 R shoulder wfl  Passive ROM Left eval L 11/29/22 AAROM with wand L 12/16/22  L AROM 12/21/22  L AROM 12/30/22 L AAROM 01/06/23  Shoulder flexion 70 105 122 130 142   Shoulder extension        Shoulder abduction 80 115 130 112 130   Shoulder adduction        Shoulder internal rotation    L PSIS    Shoulder external rotation -18 10 17  L habd behind base of neck 28 39  Elbow flexion        Elbow extension        Wrist flexion        Wrist extension        Wrist ulnar deviation        Wrist radial deviation        Wrist pronation        Wrist supination         (Blank rows = not tested)  UPPER EXTREMITY MMT: L UE MMT not performed due to recent injury , restrictions. Did note active contraction of all shoulder musculature and elbow, wrist, hand musculature  R hand and wrist deficits with grasp strength due to previous traumatic injury 12/30/22: L shoulder flex 3+ L shoulder abd 3+ L shoulder ER 4 L shoulder IR 4  SHOULDER SPECIAL TESTS: NA due to orthopedic  restrictions   JOINT MOBILITY TESTING:  na  PALPATION:  Mild undulation L lateral upper arm mild edema L upper arm    TODAY'S TREATMENT:  DATE: 01/18/23: Manual:  supine for manual stretching L shoulder , all planes, with distraction of L humeral head. Inferior glides L humeral head, with L shoulder abducted to 90 and to 110, 45 sec bouts Prone for HVLA mid and lower thoracic spine Prone for deep massage with theragun L post shoulder musculature, L periscapular musculature  Therapeutic exercise:  Supine pec flys with 3# cuff wt L wrist shoulder at 60 and 90 degrees abd, mass practice Supine for isometric shoulder abd with B shoulder flexion mass practice Seated rows, hands horizontal, 45# 3 x 10 Seated forward shoulder press 15# 3 x 10  01/11/23: Therapeutic exercise: combined with manual techniques to address L scapular rhythm and centralization of humeral head.  Patient positioned in supine for stretching L shoulder into flex, abd, ER, all with varying GH jt distraction, also contract /relax at end range Supine inf glides humeral head with shoulder in 80 degrees abd, 3 bouts 45 sec Supine for stretching with 3 # wt into horizontal abduction, at 60 degrees, 90 degrees, 110 degrees, with pec fly, therapist lightly supporting under arm protect with stretch, mas practice Side lying on R for open books, 3# cuff wt 15 reps, therapist manually stabilizing /depressing scapula Side lying for L shoulder abd with therapist distracting humeral head and performing post glide stretch mas practice Massage gun for deep pressure, cross friction mass L post shoulder capsule, scapular retractors  BATCA shoulder rows 2 x 10 hands vertical and hands horizontal, with 25# BATCA shoulder press 20#, 3 x 10  Moist heat L shoulder after session, not included in  treatment  01/06/23:  Therapeutic exercise: combined with manual techniques to address L scapular rhythm and centralization of humeral head.  Patient positioned in supine for stretching L shoulder into flex, abd, ER, all with varying GH jt distraction, also contract /relax at end range.  Side lying for L shoulder abduction with 3# wrist wt, with therapist manually depressing L scapula and stabilization of scapula against trunk. Sidelying L shoulder ER with 3 # Sidelying L shoulder open books with 3 # Side lying for theragun massage L post shoulder, middle and upper traps Prone for Y lifts L arm therapist provided some manual depression of L humeral head and stabilization L scapula to assist pt with achieving scaption movement, tends to horizontally abduct  8 min moist heat L shoulder post Rx  01/04/23: Therapeutic exercise:  R side lying for deep cross friction massage L post shoulder capsule, with massage gun. Deep pressure, cross friction massage L pecs, biceps long head and short head attachments   Therex combined with manual techniques to address movement patterns and restriction L shoulder:  Reclined for 3 bouts of inferior glides L humerus at 90 degrees abd, 60 sec each to improve post/ inf capsular tightness Supine AAROM, stretching all planes L shoulder, with distraction L GH jt. R side lying for open books with 2 #, therapist providing downward stabilization L scapula, mass practice R  side lying for L shoulder ER 2#, 3 x 10 Supine for L shoulder abduction stretch, therapist providing medial scapular glides L  Prone L shoulder ext 6# 3 x 10 Prone L shoulder horizontal abd 2# Prone PA glides mid thoracic region  Moist heat L shoulder after session for post ex soreness 8 min  12/30/22:  therapeutic ex ercise: combined with manual techniques to address movement patterns and restriction L shoulder:  UBE 2.5 F, 2.5 B level 2  Reclined for 3 bouts of  inferior glides L humerus at 90  degrees abd, 60 sec each to improve post/ inf capsular tightness Supine AAROM, stretching all planes L shoulder, with distraction L GH jt. R side lying for open books with 2 #, therapist providing downward stabilization L scapula, mass practice R  side lying for L shoulder ER 2#, 3 x 10 R side lying with L shoulder abduction stretch, therapist providing medial scapular glides L  Prone horizontal abd 2# Prone Y's no wt, with therapist providing PA glide thoracic region and depression L scapula Standing for self traction L humerus by gripping table and rocking backward to improve flexion Rom L   Moist heat L shoulder after session for post ex soreness 8 min   12/28/22: Therapeutic exercise and manual techniques, combined to improve L GH and scapulothoracic jt mobility and inferior, post capsular restrictions: UBE 2.5 F, 2.5 B level 2  Semi reclined for 3 bouts of inferior glides L humerus at 90 degrees abd, 60 sec each to improve post/ inf capsular tightness Supine AAROM, stretching all planes L shoulder, with distraction L GH jt. R side lying for open books with 2 #, therapist providing downward stabilization L scapula, mass practice R side lying for L shoulder ER with towel axilla, 2# x 20 reps, cues to perform quickly for deep stabilization Prone for horizontal L shoulder abd with 2#,  Prone L shoulder ext with 6# Prone supraspinatus lifts - Y lifts, no wt Seated with L upper arm propped on table for ER stretch with 6# wt  After session moist heat L shoulder  12/21/22: Therapeutic exercise and manual techniques, combined to improve L GH and scapulothoracic jt mobility and inferior, post capsular restrictions: UBE L2 2.69F ,2.5B Semi reclined for inferior glides L GH jt x 60 sec bouts x 3 Manual stretching in supine L shoulder all planes, stretching L shoulder ER with humerus adducted, at 60 degrees abd and at 90 degrees abd, therapist provided inferior glide L scapula to prevent excessive  firing L upper traps multiple reps Supine isometric shoulder abd sustained with B shoulder flexion 15 reps Supine fast B shoulder ER with red t band 30x Side lying R  for thoracic open books, therapist providing lateral pressure scapula to isolate stretch post capsule Side lying L shoulder abd with distraction of L shoulder/ GH jt   Moist heat L shoulder after session for post ex soreness 8 min   12/16/22 Pulleys: 3 min flexion, 3 min scaption Standing L shoulder ER with wand x 10 Standing isometric shoulder IR YTB asistaning into ER 10x5" Passive stretching into ER with prolonged holds Contract and relax into ER  Supine L shoulder ER with wand towel under elbow S/L L shoulder horizontal ABD x 10   12/14/22:  UBE, lowest resistance, x 2 1/2 F, 2/1/2 B Supine for manual stretching, AAROM by PT for ER, flex, all planes, overpressure end range. Supine serratus punches 20x Supine L shoulder ER , stretch with green t band, then concentric pull into IR , multiple reps Supine red t band ER , short bursts, low amplitude, high speed with control emphasized, mass practice to fatigue Side lying R for L shoulder abduction, with therapist applying medial scapular glide to isolate inferior axilla Sidelying scapular punches to ceiling, 15 x Side lying on R for open books, therapist assisting  Standing wall slides with hand in pillow case into flex, scaption Standing B shoulder forward flexion hands in pillow case to engage scapular retractors Shoulder pulley  5 min Wall push ups 10 x   Moist heat L shoulder after session , 10 min 12/02/22:  Pulleys 5 min Seated with towel under L axilla for wand ER, 10x  Various supine stretching therex, performed with intermittent light GH distraction and shaking to relieve pain Supine for therapist assisted L serratus punches Supine for therapist assisted L Pec flys to stretch L ant chest Supine sustained stretch into ER with red theraband in palm,  tolerated 1 1/2 min R Side lying for scapular mobs , lat glides Side lying therapist assisted punches to ceiling  R side lying shoulder abduction stretch R sidelying for L shoulder ER assisted.  Supine thoracic spine stretch with 1/2 foam roller along thoracic spine to open L ant chest wall  Moist heat ant and post shoulder at end of session, not billed  11/29/22 Seated swiffer AAROM slides into flexion x 15; scaption x 15 Pulleys x 3 min flexion; x 3 min scaption Supine wand flexion x 10  Supine wand ER x 10  Measured ROM  Vasopnuematic: game ready 10 min at end of session L shoulder in sitting. 11/25/22 Therapeutic exercise: to improve flexibility L shoulder Supine PROM/ AAROM L shoulder mass practice  Added supine cane ER 5 sec holds, 10 x Added supine cane scapular punches therapist with light support L arm to maintain vertical position Supine cane flexion assisted from 90 to 100, 5 reps R side lying for scapular clocks, therapist providing tactile cues with hands Standing table slides with basketball on counter flex, scaption 10x Kinesiotape L shoulder, 2- I pieces , one from mid delts to middle traps, one from lateral delts to L upper traps, 35% pull  Vasopnuematic: game ready 10 min at end of session L shoulder in sitting.  11/22/22 MANUAL THERAPY: To promote normalized muscle tension, improved flexibility, improved joint mobility, increased ROM, and reduced pain.  STM/DTM and manual TPR to L UT, LS, rhomboids, middle & lower traps, lats  THERAPEUTIC EXERCISE: to improve flexibility, strength and mobility.  Demonstration, verbal and tactile cues throughout for technique. Pulleys: Flexion x 3 min Seated L AA flexion with swiffer x 10 Seated L AA scaption with swiffer x 10 Seated L AA circles with swiffer x 10  PROM to L shoulder MODALITIES: Game Ready vasopneumatic compression post session to L shoulder x 10 min, low compression, 34 to reduce post-exercise pain and  swelling/edema  11/15/22 THERAPEUTIC EXERCISE: to improve flexibility, strength and mobility.  Demonstration, verbal and tactile cues throughout for technique. Pulleys: Flexion x 3 min B rhomboid (lower cervical/upper thoracic) stretch 3 x 30" - hands supported on Swiffer handle L UT stretch 3 x 30" L LS stretch 3 x 30" L shoulder flexion AAROM - holding onto Swiffer handle (as UE ranger) x 10 L shoulder scaption AAROM - holding onto Swiffer handle (as UE ranger) x 10 L shoulder pendulums - flexion/extension, horiz ABD/ADD, CW/CCW x 30" each   MANUAL THERAPY: To promote normalized muscle tension, improved flexibility, improved joint mobility, increased ROM, and reduced pain.  STM/DTM and manual TPR to L UT, LS, rhomboids, middle & lower traps, infraspinatus L shoulder gentle distraction +/- CW/CCW oscillations to promote muscle relaxation L shoulder PROM to tolerance in supine  MODALITIES: Game Ready vasopneumatic compression post session to L shoulder x 10 min, low compression, 34 to reduce post-exercise pain and swelling/edema   11/11/22  Therapeutic Exercise: to improve strength and mobility.  Demo, verbal and tactile cues throughout for technique.  Table  slides into flexion x 10  Table slides scaption x 10  Seated L UT 2x30 sec Seated L LS 2x30 sec Seated scap retraction 10x3" PROM to L shoulder flexion and scaption Manual Therapy: STM to L UT, LS, rhomboids  Vaso to L shoulder for edema 34 deg ;low compression x 10 min   11/09/22 Therapeutic Exercise: to improve strength and mobility.  Demo, verbal and tactile cues throughout for technique.  PROM within MD precautions Putty squeezes for hand and wrist x 10  Seated L UT stretch 2x30 sec Seated L LS stretch 2x30 sec Seated thoracic extension 10x3" Seated table slides into flexion PROM x 10    PATIENT EDUCATION: Education details: HEP review and HEP modification - use of Swiffer as alternative for table slides Person  educated: Patient and Spouse Education method: Explanation, Demonstration, Tactile cues, and Verbal cues Education comprehension: verbalized understanding and returned demonstration  HOME EXERCISE PROGRAM: Access Code: [email or web address] URL: [email or web address] Date: 11/11/2022 Prepared by: Verta Ellen  Exercises - Seated Upper Trapezius Stretch  - 2 x daily - 7 x weekly - 2 sets - 2 reps - 30 sec hold - Gentle Levator Scapulae Stretch  - 2 x daily - 7 x weekly - 2 sets - 2 reps - 30 sec hold - Seated Thoracic Extension AROM  - 1 x daily - 7 x weekly - 2 sets - 10 reps - Seated Shoulder Flexion Towel Slide at Table Top  - 1 x daily - 7 x weekly - 2 sets - 10 reps - Seated Shoulder Scaption Slide at Table Top with Forearm in Neutral  - 1 x daily - 7 x weekly - 2 sets - 10 reps - Seated Scapular Retraction  - 1 x daily - 7 x weekly - 2 sets - 10 reps   ASSESSMENT:  CLINICAL IMPRESSION: Continued with AAROM exercises and stretching to improve functional ER , abd, motion of L shoulder.   Strength continues to be much improved, as well as overall movement in all planes L shoulder.   Cyson will benefit from continued skilled PT to address above deficits to improve mobility and activity tolerance with decreased pain interference.  OBJECTIVE IMPAIRMENTS: decreased activity tolerance, decreased ROM, decreased strength, increased edema, increased fascial restrictions, impaired flexibility, and pain.   ACTIVITY LIMITATIONS: carrying, lifting, sleeping, bathing, toileting, dressing, and reach over head  PARTICIPATION LIMITATIONS: meal prep, cleaning, laundry, driving, shopping, community activity, and yard work  PERSONAL FACTORS: Fitness, Past/current experiences, Time since onset of injury/illness/exacerbation, Transportation, and 1-2 comorbidities: psoriasis, LBP  are also affecting patient's functional outcome.   REHAB POTENTIAL: Good  CLINICAL DECISION MAKING:  Stable/uncomplicated  EVALUATION COMPLEXITY: Low   GOALS: Goals reviewed with patient? Yes  SHORT TERM GOALS: Target date: 4 weeks, 12/02/22  ROM L shoulder flex , abd 90, ER 30 Baseline:flex 70, abd 80, Er -18 Goal status: IN PROGRESS- met for flexion- 11/29/22  LONG TERM GOALS: Target date: 12 weeks: 01/27/23  I HEP Baseline: TBD Goal status: Met for current HEP- 11/22/22  2.  L shoulder ROM for elevation 130 or greater for functional reach, ADL's Baseline:  Goal status: IN PROGRESS- 12/16/22 12/30/22 see charts above  3.  L shoulder ER 60 or greater for functional reach, IR hand behind back to L 1 for dressing, bathing  Baseline:  Goal status: IN PROGRESS- 12/16/22 see charts above 12/30/22 progressing see above charts  4.  Quick DASH L shoulder 25% or less Baseline: 79.5% disability Goal status:  IN PROGRESS  5.  Strength L shoulder 4+/5 all planes for ability to play golf, perform yard work, lifting Baseline:  Goal status: IN PROGRESS 12/30/22 see above MMT    PLAN:  PT FREQUENCY: 2x/week  PT DURATION: 12 weeks  PLANNED INTERVENTIONS: Therapeutic exercises, Therapeutic activity, Neuromuscular re-education, Balance training, Gait training, Patient/Family education, Self Care, and Joint mobilization  PLAN FOR NEXT SESSION: continue PROM and AAROM , try to engage with Er as much as pt can tolerate, progress with resistance, add more rhythmic stabilization   Rhayne Chatwin L Kyndahl Jablon, PT, DPT, OCS 01/18/2023, 1:01 PM

## 2023-01-24 ENCOUNTER — Ambulatory Visit: Payer: BC Managed Care – PPO | Attending: Surgical

## 2023-01-24 DIAGNOSIS — M25612 Stiffness of left shoulder, not elsewhere classified: Secondary | ICD-10-CM | POA: Insufficient documentation

## 2023-01-24 DIAGNOSIS — Z8781 Personal history of (healed) traumatic fracture: Secondary | ICD-10-CM | POA: Insufficient documentation

## 2023-01-24 DIAGNOSIS — M25512 Pain in left shoulder: Secondary | ICD-10-CM | POA: Insufficient documentation

## 2023-01-24 DIAGNOSIS — M6281 Muscle weakness (generalized): Secondary | ICD-10-CM | POA: Insufficient documentation

## 2023-01-27 ENCOUNTER — Other Ambulatory Visit: Payer: Self-pay

## 2023-01-27 ENCOUNTER — Ambulatory Visit: Payer: BC Managed Care – PPO

## 2023-01-27 DIAGNOSIS — M25512 Pain in left shoulder: Secondary | ICD-10-CM

## 2023-01-27 DIAGNOSIS — M25612 Stiffness of left shoulder, not elsewhere classified: Secondary | ICD-10-CM

## 2023-01-27 DIAGNOSIS — Z8781 Personal history of (healed) traumatic fracture: Secondary | ICD-10-CM | POA: Diagnosis present

## 2023-01-27 DIAGNOSIS — M6281 Muscle weakness (generalized): Secondary | ICD-10-CM | POA: Diagnosis present

## 2023-01-27 NOTE — Therapy (Signed)
OUTPATIENT PHYSICAL THERAPY TREATMENT Progress Note Reporting Period 11/04/22 to 01/27/23  See note below for Objective Data and Assessment of Progress/Goals.         Patient Name: Isaiah Gibson MRN: [id number] DOB:03-24-1985, 37 y.o., male Today's Date: 01/27/2023  END OF SESSION:  PT End of Session - 01/27/23 1354     Visit Number 19    Date for PT Re-Evaluation 02/22/23    Progress Note Due on Visit 30    PT Start Time 1015    PT Stop Time 1100    PT Time Calculation (min) 45 min    Activity Tolerance Patient tolerated treatment well    Behavior During Therapy [facility] for tasks assessed/performed              Past Medical History:  Diagnosis Date   ADD 10/20/2006   BACK PAIN 05/15/2007   GERD 10/20/2006   HEMATOCHEZIA 12/18/2009   INSOMNIA-SLEEP DISORDER-UNSPEC 05/15/2007   Palpitations 01/25/2008   POLYCYTHEMIA 05/20/2009   PSORIASIS 10/20/2006   SMOKER 05/15/2007   VENEREAL WART 05/15/2007   Past Surgical History:  Procedure Laterality Date   HAND SURGERY Right    ORIF SHOULDER FRACTURE Left 10/08/2022   Procedure: OPEN REDUCTION INTERNAL FIXATION (ORIF) GREATER TUBEROSITY FRACTURE;  Surgeon: Cammy Copa, MD;  Location: [facility] OR;  Service: Orthopedics;  Laterality: Left;  HANDY BED   WISDOM TOOTH EXTRACTION     Patient Active Problem List   Diagnosis Date Noted   Closed fracture dislocation of shoulder with routine healing 10/09/2022   Obesity (BMI 35.0-39.9 without comorbidity) 07/22/2022   Vitamin D deficiency 07/22/2022   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   Hematospermia 09/11/2020   Fatigue 09/27/2019   Erectile dysfunction 09/27/2019   Upper respiratory tract infection 09/27/2019   Screening-pulmonary TB 04/23/2017   Anxiety 04/14/2016   Panic attacks 04/14/2016   Weight gain 11/26/2015   Former smoker 11/26/2015   Pectoralis muscle strain 04/22/2015   Tendinitis of forearm 04/22/2015   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 04/16/2012    Dizziness 01/25/2011   Urinary frequency 01/25/2011   Bilateral leg edema 01/25/2011   Encounter for well adult exam with abnormal findings 01/02/2011   HEMATOCHEZIA 12/18/2009   WRIST PAIN, RIGHT 12/09/2009   ANKLE PAIN, RIGHT 12/09/2009   POLYCYTHEMIA 05/20/2009   RASH-NONVESICULAR 09/04/2008   ANKLE SPRAIN, LEFT 05/31/2008   Palpitations 01/25/2008   VENEREAL WART 05/15/2007   SMOKER 05/15/2007   BACK PAIN 05/15/2007   INSOMNIA-SLEEP DISORDER-UNSPEC 05/15/2007   Attention deficit disorder 10/20/2006   GERD 10/20/2006   PSORIASIS 10/20/2006    PCP: Oliver Barre, MD  REFERRING PROVIDER: Julieanne Cotton, PA-C  REFERRING DIAG: L proximal humerus fx and dislocation, s/p ORIF  THERAPY DIAG:  History of left shoulder fracture  Stiffness of left shoulder, not elsewhere classified  Muscle weakness of left arm  Acute pain of left shoulder  Rationale for Evaluation and Treatment: Rehabilitation  ONSET DATE: 10/08/22  NEXT MD VISIT: 12/01/22   SUBJECTIVE:  SUBJECTIVE STATEMENT: Pt reports  using his L arm more frequently, carrying heavier items. Functionally limited with donning shirts, coat. A little more control with lifting L hand to mouth, not abducting as much.   Hand dominance: Right  PERTINENT HISTORY: Open reduction and internal fixation of left proximal humerus fracture.   PAIN:  Are you having pain? Yes: NPRS scale: 0/10 Pain location: L neck and shoulder into scapula  Pain description: stiff, aching, worse in am, when wakes up Aggravating factors: lying down Relieving factors: oxycodone, muscle relaxer  PRECAUTIONS: Other: As of 11/03/22 ortho PA notes: He is okay for full passive range of motion and okay to start active assisted range of motion.  No full active range of motion or  rotator cuff strengthening exercises until cleared at his next appointment.  Follow-up in 4 weeks for clinical recheck with Dr. August Saucer.  RED FLAGS: None   WEIGHT BEARING RESTRICTIONS: Yes NWB L UE  FALLS:  Has patient fallen in last 6 months? Yes. Number of falls 1  LIVING ENVIRONMENT: Lives with: lives with their family Lives in: House/apartment Stairs: Yes: Internal: 11 steps; on left going up and External: 3 steps; on right going up Has following equipment at home: None  OCCUPATION: Multimedia programmer, on computer  PLOF: Independent  PATIENT GOALS:to be able to participate in golfing, activities, with family, use L arm for dressing, bathing, I   OBJECTIVE:   DIAGNOSTIC FINDINGS:  Images available in epic chart review. Has plate and 8 screws L prox humerus  PATIENT SURVEYS:  Quick Dash 79.5%  COGNITION: Overall cognitive status: Within functional limits for tasks assessed     SENSATION: WFL  POSTURE: Soft sling L shoulder. Rounding L shoulder  UPPER EXTREMITY ROM: PROM L shoulder flexion 70, abd 80, ER -18 R shoulder wfl  Passive ROM Left eval L 11/29/22 AAROM with wand L 12/16/22  L AROM 12/21/22  L AROM 12/30/22 L AAROM 01/06/23 L AROM/ AAROM 01/27/23   Shoulder flexion 70 105 122 130 142  132, 152  Shoulder extension         Shoulder abduction 80 115 130 112 130  118/155  Shoulder adduction         Shoulder internal rotation    L PSIS   L PSIS  Shoulder external rotation -18 10 17  L habd behind base of neck 28 39 40/43  Elbow flexion         Elbow extension         Wrist flexion         Wrist extension         Wrist ulnar deviation         Wrist radial deviation         Wrist pronation         Wrist supination          (Blank rows = not tested)  UPPER EXTREMITY MMT: L UE MMT not performed due to recent injury , restrictions. Did note active contraction of all shoulder musculature and elbow, wrist, hand musculature  R hand and wrist deficits with  grasp strength due to previous traumatic injury 12/30/22: L shoulder flex 3+ L shoulder abd 3+ L shoulder ER 4 L shoulder IR 4  SHOULDER SPECIAL TESTS: NA due to orthopedic restrictions   JOINT MOBILITY TESTING:  na  PALPATION:  Mild undulation L lateral upper arm mild edema L upper arm    TODAY'S TREATMENT:  DATE:01/27/23:  UBE 3 min F, 3 min B, level 2 Supine for manual stretching L shoulder , all planes, with inferior glides L gh jt, medial scapular glides  Side lying R for horizontal adduction,for book openers,with 3# cuff wt , therapist stabilizing L scapula Prone for HVLT mid and upper thoracic spine Deep massage with massage gun L post/inferior shoulder musculature  01/18/23: Manual:  supine for manual stretching L shoulder , all planes, with distraction of L humeral head. Inferior glides L humeral head, with L shoulder abducted to 90 and to 110, 45 sec bouts Prone for HVLA mid and lower thoracic spine Prone for deep massage with theragun L post shoulder musculature, L periscapular musculature  Therapeutic exercise:  Supine pec flys with 3# cuff wt L wrist shoulder at 60 and 90 degrees abd, mass practice Supine for isometric shoulder abd with B shoulder flexion mass practice Seated rows, hands horizontal, 45# 3 x 10 Seated forward shoulder press 15# 3 x 10  01/11/23: Therapeutic exercise: combined with manual techniques to address L scapular rhythm and centralization of humeral head.  Patient positioned in supine for stretching L shoulder into flex, abd, ER, all with varying GH jt distraction, also contract /relax at end range Supine inf glides humeral head with shoulder in 80 degrees abd, 3 bouts 45 sec Supine for stretching with 3 # wt into horizontal abduction, at 60 degrees, 90 degrees, 110 degrees, with pec fly, therapist lightly  supporting under arm protect with stretch, mas practice Side lying on R for open books, 3# cuff wt 15 reps, therapist manually stabilizing /depressing scapula Side lying for L shoulder abd with therapist distracting humeral head and performing post glide stretch mas practice Massage gun for deep pressure, cross friction mass L post shoulder capsule, scapular retractors  BATCA shoulder rows 2 x 10 hands vertical and hands horizontal, with 25# BATCA shoulder press 20#, 3 x 10  Moist heat L shoulder after session, not included in treatment  01/06/23:  Therapeutic exercise: combined with manual techniques to address L scapular rhythm and centralization of humeral head.  Patient positioned in supine for stretching L shoulder into flex, abd, ER, all with varying GH jt distraction, also contract /relax at end range.  Side lying for L shoulder abduction with 3# wrist wt, with therapist manually depressing L scapula and stabilization of scapula against trunk. Sidelying L shoulder ER with 3 # Sidelying L shoulder open books with 3 # Side lying for theragun massage L post shoulder, middle and upper traps Prone for Y lifts L arm therapist provided some manual depression of L humeral head and stabilization L scapula to assist pt with achieving scaption movement, tends to horizontally abduct  8 min moist heat L shoulder post Rx  01/04/23: Therapeutic exercise:  R side lying for deep cross friction massage L post shoulder capsule, with massage gun. Deep pressure, cross friction massage L pecs, biceps long head and short head attachments   Therex combined with manual techniques to address movement patterns and restriction L shoulder:  Reclined for 3 bouts of inferior glides L humerus at 90 degrees abd, 60 sec each to improve post/ inf capsular tightness Supine AAROM, stretching all planes L shoulder, with distraction L GH jt. R side lying for open books with 2 #, therapist providing downward  stabilization L scapula, mass practice R  side lying for L shoulder ER 2#, 3 x 10 Supine for L shoulder abduction stretch, therapist providing medial scapular glides L  Prone  L shoulder ext 6# 3 x 10 Prone L shoulder horizontal abd 2# Prone PA glides mid thoracic region  Moist heat L shoulder after session for post ex soreness 8 min  12/30/22:  therapeutic ex ercise: combined with manual techniques to address movement patterns and restriction L shoulder:  UBE 2.5 F, 2.5 B level 2  Reclined for 3 bouts of inferior glides L humerus at 90 degrees abd, 60 sec each to improve post/ inf capsular tightness Supine AAROM, stretching all planes L shoulder, with distraction L GH jt. R side lying for open books with 2 #, therapist providing downward stabilization L scapula, mass practice R  side lying for L shoulder ER 2#, 3 x 10 R side lying with L shoulder abduction stretch, therapist providing medial scapular glides L  Prone horizontal abd 2# Prone Y's no wt, with therapist providing PA glide thoracic region and depression L scapula Standing for self traction L humerus by gripping table and rocking backward to improve flexion Rom L   Moist heat L shoulder after session for post ex soreness 8 min   12/28/22: Therapeutic exercise and manual techniques, combined to improve L GH and scapulothoracic jt mobility and inferior, post capsular restrictions: UBE 2.5 F, 2.5 B level 2  Semi reclined for 3 bouts of inferior glides L humerus at 90 degrees abd, 60 sec each to improve post/ inf capsular tightness Supine AAROM, stretching all planes L shoulder, with distraction L GH jt. R side lying for open books with 2 #, therapist providing downward stabilization L scapula, mass practice R side lying for L shoulder ER with towel axilla, 2# x 20 reps, cues to perform quickly for deep stabilization Prone for horizontal L shoulder abd with 2#,  Prone L shoulder ext with 6# Prone supraspinatus lifts - Y lifts, no  wt Seated with L upper arm propped on table for ER stretch with 6# wt  After session moist heat L shoulder  12/21/22: Therapeutic exercise and manual techniques, combined to improve L GH and scapulothoracic jt mobility and inferior, post capsular restrictions: UBE L2 2.63F ,2.5B Semi reclined for inferior glides L GH jt x 60 sec bouts x 3 Manual stretching in supine L shoulder all planes, stretching L shoulder ER with humerus adducted, at 60 degrees abd and at 90 degrees abd, therapist provided inferior glide L scapula to prevent excessive firing L upper traps multiple reps Supine isometric shoulder abd sustained with B shoulder flexion 15 reps Supine fast B shoulder ER with red t band 30x Side lying R  for thoracic open books, therapist providing lateral pressure scapula to isolate stretch post capsule Side lying L shoulder abd with distraction of L shoulder/ GH jt   Moist heat L shoulder after session for post ex soreness 8 min   12/16/22 Pulleys: 3 min flexion, 3 min scaption Standing L shoulder ER with wand x 10 Standing isometric shoulder IR YTB asistaning into ER 10x5" Passive stretching into ER with prolonged holds Contract and relax into ER  Supine L shoulder ER with wand towel under elbow S/L L shoulder horizontal ABD x 10   12/14/22:  UBE, lowest resistance, x 2 1/2 F, 2/1/2 B Supine for manual stretching, AAROM by PT for ER, flex, all planes, overpressure end range. Supine serratus punches 20x Supine L shoulder ER , stretch with green t band, then concentric pull into IR , multiple reps Supine red t band ER , short bursts, low amplitude, high speed with control emphasized, mass  practice to fatigue Side lying R for L shoulder abduction, with therapist applying medial scapular glide to isolate inferior axilla Sidelying scapular punches to ceiling, 15 x Side lying on R for open books, therapist assisting  Standing wall slides with hand in pillow case into flex,  scaption Standing B shoulder forward flexion hands in pillow case to engage scapular retractors Shoulder pulley 5 min Wall push ups 10 x   Moist heat L shoulder after session , 10 min 12/02/22:  Pulleys 5 min Seated with towel under L axilla for wand ER, 10x  Various supine stretching therex, performed with intermittent light GH distraction and shaking to relieve pain Supine for therapist assisted L serratus punches Supine for therapist assisted L Pec flys to stretch L ant chest Supine sustained stretch into ER with red theraband in palm, tolerated 1 1/2 min R Side lying for scapular mobs , lat glides Side lying therapist assisted punches to ceiling  R side lying shoulder abduction stretch R sidelying for L shoulder ER assisted.  Supine thoracic spine stretch with 1/2 foam roller along thoracic spine to open L ant chest wall  Moist heat ant and post shoulder at end of session, not billed  11/29/22 Seated swiffer AAROM slides into flexion x 15; scaption x 15 Pulleys x 3 min flexion; x 3 min scaption Supine wand flexion x 10  Supine wand ER x 10  Measured ROM  Vasopnuematic: game ready 10 min at end of session L shoulder in sitting. 11/25/22 Therapeutic exercise: to improve flexibility L shoulder Supine PROM/ AAROM L shoulder mass practice  Added supine cane ER 5 sec holds, 10 x Added supine cane scapular punches therapist with light support L arm to maintain vertical position Supine cane flexion assisted from 90 to 100, 5 reps R side lying for scapular clocks, therapist providing tactile cues with hands Standing table slides with basketball on counter flex, scaption 10x Kinesiotape L shoulder, 2- I pieces , one from mid delts to middle traps, one from lateral delts to L upper traps, 35% pull  Vasopnuematic: game ready 10 min at end of session L shoulder in sitting.  11/22/22 MANUAL THERAPY: To promote normalized muscle tension, improved flexibility, improved joint mobility,  increased ROM, and reduced pain.  STM/DTM and manual TPR to L UT, LS, rhomboids, middle & lower traps, lats  THERAPEUTIC EXERCISE: to improve flexibility, strength and mobility.  Demonstration, verbal and tactile cues throughout for technique. Pulleys: Flexion x 3 min Seated L AA flexion with swiffer x 10 Seated L AA scaption with swiffer x 10 Seated L AA circles with swiffer x 10  PROM to L shoulder MODALITIES: Game Ready vasopneumatic compression post session to L shoulder x 10 min, low compression, 34 to reduce post-exercise pain and swelling/edema  11/15/22 THERAPEUTIC EXERCISE: to improve flexibility, strength and mobility.  Demonstration, verbal and tactile cues throughout for technique. Pulleys: Flexion x 3 min B rhomboid (lower cervical/upper thoracic) stretch 3 x 30" - hands supported on Swiffer handle L UT stretch 3 x 30" L LS stretch 3 x 30" L shoulder flexion AAROM - holding onto Swiffer handle (as UE ranger) x 10 L shoulder scaption AAROM - holding onto Swiffer handle (as UE ranger) x 10 L shoulder pendulums - flexion/extension, horiz ABD/ADD, CW/CCW x 30" each   MANUAL THERAPY: To promote normalized muscle tension, improved flexibility, improved joint mobility, increased ROM, and reduced pain.  STM/DTM and manual TPR to L UT, LS, rhomboids, middle & lower traps, infraspinatus  L shoulder gentle distraction +/- CW/CCW oscillations to promote muscle relaxation L shoulder PROM to tolerance in supine  MODALITIES: Game Ready vasopneumatic compression post session to L shoulder x 10 min, low compression, 34 to reduce post-exercise pain and swelling/edema   11/11/22  Therapeutic Exercise: to improve strength and mobility.  Demo, verbal and tactile cues throughout for technique.  Table slides into flexion x 10  Table slides scaption x 10  Seated L UT 2x30 sec Seated L LS 2x30 sec Seated scap retraction 10x3" PROM to L shoulder flexion and scaption Manual Therapy: STM to L  UT, LS, rhomboids  Vaso to L shoulder for edema 34 deg ;low compression x 10 min   11/09/22 Therapeutic Exercise: to improve strength and mobility.  Demo, verbal and tactile cues throughout for technique.  PROM within MD precautions Putty squeezes for hand and wrist x 10  Seated L UT stretch 2x30 sec Seated L LS stretch 2x30 sec Seated thoracic extension 10x3" Seated table slides into flexion PROM x 10    PATIENT EDUCATION: Education details: HEP review and HEP modification - use of Swiffer as alternative for table slides Person educated: Patient and Spouse Education method: Explanation, Demonstration, Tactile cues, and Verbal cues Education comprehension: verbalized understanding and returned demonstration  HOME EXERCISE PROGRAM: Access Code: [email or web address] URL: [email or web address] Date: 11/11/2022 Prepared by: Verta Ellen  Exercises - Seated Upper Trapezius Stretch  - 2 x daily - 7 x weekly - 2 sets - 2 reps - 30 sec hold - Gentle Levator Scapulae Stretch  - 2 x daily - 7 x weekly - 2 sets - 2 reps - 30 sec hold - Seated Thoracic Extension AROM  - 1 x daily - 7 x weekly - 2 sets - 10 reps - Seated Shoulder Flexion Towel Slide at Table Top  - 1 x daily - 7 x weekly - 2 sets - 10 reps - Seated Shoulder Scaption Slide at Table Top with Forearm in Neutral  - 1 x daily - 7 x weekly - 2 sets - 10 reps - Seated Scapular Retraction  - 1 x daily - 7 x weekly - 2 sets - 10 reps   ASSESSMENT:  CLINICAL IMPRESSION: Continued with AAROM exercises and stretching to improve functional ER , abd, motion of L shoulder.   Strength continues to be much improved, as well as overall movement in all planes L shoulder.  Re asessed today, he continues to make progress with flexibility although slowing down.  He is to see his surgeon 02/27/23 , wants to continue through the end to this month to maximize his outcome with PT . Isaiah Gibson will benefit from continued skilled PT to address above  deficits to improve mobility and activity tolerance with decreased pain interference.  OBJECTIVE IMPAIRMENTS: decreased activity tolerance, decreased ROM, decreased strength, increased edema, increased fascial restrictions, impaired flexibility, and pain.   ACTIVITY LIMITATIONS: carrying, lifting, sleeping, bathing, toileting, dressing, and reach over head  PARTICIPATION LIMITATIONS: meal prep, cleaning, laundry, driving, shopping, community activity, and yard work  PERSONAL FACTORS: Fitness, Past/current experiences, Time since onset of injury/illness/exacerbation, Transportation, and 1-2 comorbidities: psoriasis, LBP  are also affecting patient's functional outcome.   REHAB POTENTIAL: Good  CLINICAL DECISION MAKING: Stable/uncomplicated  EVALUATION COMPLEXITY: Low   GOALS: Goals reviewed with patient? Yes  SHORT TERM GOALS: Target date: 4 weeks, 12/02/22  ROM L shoulder flex , abd 90, ER 30 Baseline:flex 70, abd 80, Er -18 Goal status: IN PROGRESS- met for flexion- 11/29/22  LONG TERM GOALS: Target date: 12 weeks: 01/27/23, extended to 02/22/23  I HEP Baseline: TBD Goal status: Met for current HEP- 11/22/22  2.  L shoulder ROM for elevation 130 or greater for functional reach, ADL's Baseline:  Goal status: IN PROGRESS- 12/16/22 12/30/22 see charts above 01/27/23: met so far  3.  L shoulder ER 60 or greater for functional reach, IR hand behind back to L 1 for dressing, bathing  Baseline:  Goal status: IN PROGRESS- 12/16/22 see charts above 12/30/22 progressing see above charts 01/27/23: progressing measured 40 degrees today  4.  Quick DASH L shoulder 25% or less Baseline: 79.5% disability Goal status: IN PROGRESS  5.  Strength L shoulder 4+/5 all planes for ability to play golf, perform yard work, lifting Baseline:  Goal status: IN PROGRESS 12/30/22 see above MMT  01/27/23: goal met   PLAN:  PT FREQUENCY: 2x/week  PT DURATION: 12 weeks  PLANNED INTERVENTIONS:  Therapeutic exercises, Therapeutic activity, Neuromuscular re-education, Balance training, Gait training, Patient/Family education, Self Care, and Joint mobilization  PLAN FOR NEXT SESSION: continue PROM and AAROM , try to engage with Er as much as pt can tolerate, progress with resistance, add more rhythmic stabilization   Isaiah Gibson L Biddie Sebek, PT, DPT, OCS 01/27/2023, 1:56 PM

## 2023-01-31 ENCOUNTER — Ambulatory Visit: Payer: BC Managed Care – PPO

## 2023-01-31 DIAGNOSIS — M25512 Pain in left shoulder: Secondary | ICD-10-CM

## 2023-01-31 DIAGNOSIS — M6281 Muscle weakness (generalized): Secondary | ICD-10-CM

## 2023-01-31 DIAGNOSIS — Z8781 Personal history of (healed) traumatic fracture: Secondary | ICD-10-CM

## 2023-01-31 DIAGNOSIS — M25612 Stiffness of left shoulder, not elsewhere classified: Secondary | ICD-10-CM

## 2023-01-31 NOTE — Therapy (Signed)
OUTPATIENT PHYSICAL THERAPY TREATMENT         Patient Name: CHASKE ORNDORFF MRN: [id number] DOB:1986/01/07, 37 y.o., male Today's Date: 01/31/2023  END OF SESSION:  PT End of Session - 01/31/23 1101     Visit Number 20    Date for PT Re-Evaluation 01/27/23    Progress Note Due on Visit 20    PT Start Time 1018    PT Stop Time 1058    PT Time Calculation (min) 40 min               Past Medical History:  Diagnosis Date   ADD 10/20/2006   BACK PAIN 05/15/2007   GERD 10/20/2006   HEMATOCHEZIA 12/18/2009   INSOMNIA-SLEEP DISORDER-UNSPEC 05/15/2007   Palpitations 01/25/2008   POLYCYTHEMIA 05/20/2009   PSORIASIS 10/20/2006   SMOKER 05/15/2007   VENEREAL WART 05/15/2007   Past Surgical History:  Procedure Laterality Date   HAND SURGERY Right    ORIF SHOULDER FRACTURE Left 10/08/2022   Procedure: OPEN REDUCTION INTERNAL FIXATION (ORIF) GREATER TUBEROSITY FRACTURE;  Surgeon: Cammy Copa, MD;  Location: [facility] OR;  Service: Orthopedics;  Laterality: Left;  HANDY BED   WISDOM TOOTH EXTRACTION     Patient Active Problem List   Diagnosis Date Noted   Closed fracture dislocation of shoulder with routine healing 10/09/2022   Obesity (BMI 35.0-39.9 without comorbidity) 07/22/2022   Vitamin D deficiency 07/22/2022   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   Hematospermia 09/11/2020   Fatigue 09/27/2019   Erectile dysfunction 09/27/2019   Upper respiratory tract infection 09/27/2019   Screening-pulmonary TB 04/23/2017   Anxiety 04/14/2016   Panic attacks 04/14/2016   Weight gain 11/26/2015   Former smoker 11/26/2015   Pectoralis muscle strain 04/22/2015   Tendinitis of forearm 04/22/2015   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 04/16/2012   Dizziness 01/25/2011   Urinary frequency 01/25/2011   Bilateral leg edema 01/25/2011   Encounter for well adult exam with abnormal findings 01/02/2011   HEMATOCHEZIA 12/18/2009   WRIST PAIN, RIGHT 12/09/2009   ANKLE PAIN, RIGHT  12/09/2009   POLYCYTHEMIA 05/20/2009   RASH-NONVESICULAR 09/04/2008   ANKLE SPRAIN, LEFT 05/31/2008   Palpitations 01/25/2008   VENEREAL WART 05/15/2007   SMOKER 05/15/2007   BACK PAIN 05/15/2007   INSOMNIA-SLEEP DISORDER-UNSPEC 05/15/2007   Attention deficit disorder 10/20/2006   GERD 10/20/2006   PSORIASIS 10/20/2006    PCP: Oliver Barre, MD  REFERRING PROVIDER: Julieanne Cotton, PA-C  REFERRING DIAG: L proximal humerus fx and dislocation, s/p ORIF  THERAPY DIAG:  History of left shoulder fracture  Stiffness of left shoulder, not elsewhere classified  Muscle weakness of left arm  Acute pain of left shoulder  Rationale for Evaluation and Treatment: Rehabilitation  ONSET DATE: 10/08/22  NEXT MD VISIT: 02/27/22   SUBJECTIVE:  SUBJECTIVE STATEMENT: Pt reports  using his L arm more frequently, he went to top golf on Friday he was sore afterwards  Hand dominance: Right  PERTINENT HISTORY: Open reduction and internal fixation of left proximal humerus fracture.   PAIN:  Are you having pain? Yes: NPRS scale: 0/10 Pain location: L neck and shoulder into scapula  Pain description: stiff, aching, worse in am, when wakes up Aggravating factors: lying down Relieving factors: oxycodone, muscle relaxer  PRECAUTIONS: Other: As of 11/03/22 ortho PA notes: He is okay for full passive range of motion and okay to start active assisted range of motion.  No full active range of motion or rotator cuff strengthening exercises until cleared at his next appointment.  Follow-up in 4 weeks for clinical recheck with Dr. August Saucer.  RED FLAGS: None   WEIGHT BEARING RESTRICTIONS: Yes NWB L UE  FALLS:  Has patient fallen in last 6 months? Yes. Number of falls 1  LIVING ENVIRONMENT: Lives with: lives with their  family Lives in: House/apartment Stairs: Yes: Internal: 11 steps; on left going up and External: 3 steps; on right going up Has following equipment at home: None  OCCUPATION: Multimedia programmer, on computer  PLOF: Independent  PATIENT GOALS:to be able to participate in golfing, activities, with family, use L arm for dressing, bathing, I   OBJECTIVE:   DIAGNOSTIC FINDINGS:  Images available in epic chart review. Has plate and 8 screws L prox humerus  PATIENT SURVEYS:  Quick Dash 79.5%  COGNITION: Overall cognitive status: Within functional limits for tasks assessed     SENSATION: WFL  POSTURE: Soft sling L shoulder. Rounding L shoulder  UPPER EXTREMITY ROM: PROM L shoulder flexion 70, abd 80, ER -18 R shoulder wfl  Passive ROM Left eval L 11/29/22 AAROM with wand L 12/16/22  L AROM 12/21/22  L AROM 12/30/22 L AAROM 01/06/23 L AROM/ AAROM 01/27/23   Shoulder flexion 70 105 122 130 142  132, 152  Shoulder extension         Shoulder abduction 80 115 130 112 130  118/155  Shoulder adduction         Shoulder internal rotation    L PSIS   L PSIS  Shoulder external rotation -18 10 17  L habd behind base of neck 28 39 40/43  Elbow flexion         Elbow extension         Wrist flexion         Wrist extension         Wrist ulnar deviation         Wrist radial deviation         Wrist pronation         Wrist supination          (Blank rows = not tested)  UPPER EXTREMITY MMT: L UE MMT not performed due to recent injury , restrictions. Did note active contraction of all shoulder musculature and elbow, wrist, hand musculature  R hand and wrist deficits with grasp strength due to previous traumatic injury 12/30/22: L shoulder flex 3+ L shoulder abd 3+ L shoulder ER 4 L shoulder IR 4  SHOULDER SPECIAL TESTS: NA due to orthopedic restrictions   JOINT MOBILITY TESTING:  na  PALPATION:  Mild undulation L lateral upper arm mild edema L upper arm    TODAY'S TREATMENT:  DATE: 01/31/23 UBE L2.0 3 min each way Supine L shoulder flexion 2lb 10x with 5 second hold Supine L shoulder CW/CCW circles with 2lb weight x 20 each  S/L L shoulder ER 2x10 2lb S/L L shoulder abduction 2x10 2lb L shoulder contract/relax for ER MWM L shoulder ER and abduction  01/27/23:  UBE 3 min F, 3 min B, level 2 Supine for manual stretching L shoulder , all planes, with inferior glides L gh jt, medial scapular glides  Side lying R for horizontal adduction,for book openers,with 3# cuff wt , therapist stabilizing L scapula Prone for HVLT mid and upper thoracic spine Deep massage with massage gun L post/inferior shoulder musculature  01/18/23: Manual:  supine for manual stretching L shoulder , all planes, with distraction of L humeral head. Inferior glides L humeral head, with L shoulder abducted to 90 and to 110, 45 sec bouts Prone for HVLA mid and lower thoracic spine Prone for deep massage with theragun L post shoulder musculature, L periscapular musculature  Therapeutic exercise:  Supine pec flys with 3# cuff wt L wrist shoulder at 60 and 90 degrees abd, mass practice Supine for isometric shoulder abd with B shoulder flexion mass practice Seated rows, hands horizontal, 45# 3 x 10 Seated forward shoulder press 15# 3 x 10  01/11/23: Therapeutic exercise: combined with manual techniques to address L scapular rhythm and centralization of humeral head.  Patient positioned in supine for stretching L shoulder into flex, abd, ER, all with varying GH jt distraction, also contract /relax at end range Supine inf glides humeral head with shoulder in 80 degrees abd, 3 bouts 45 sec Supine for stretching with 3 # wt into horizontal abduction, at 60 degrees, 90 degrees, 110 degrees, with pec fly, therapist lightly supporting under arm protect with stretch, mas  practice Side lying on R for open books, 3# cuff wt 15 reps, therapist manually stabilizing /depressing scapula Side lying for L shoulder abd with therapist distracting humeral head and performing post glide stretch mas practice Massage gun for deep pressure, cross friction mass L post shoulder capsule, scapular retractors  BATCA shoulder rows 2 x 10 hands vertical and hands horizontal, with 25# BATCA shoulder press 20#, 3 x 10  Moist heat L shoulder after session, not included in treatment   PATIENT EDUCATION: Education details: HEP review and HEP modification - use of Swiffer as alternative for table slides Person educated: Patient and Spouse Education method: Explanation, Demonstration, Tactile cues, and Verbal cues Education comprehension: verbalized understanding and returned demonstration  HOME EXERCISE PROGRAM: Access Code: [email or web address] URL: [email or web address] Date: 11/11/2022 Prepared by: Verta Ellen  Exercises - Seated Upper Trapezius Stretch  - 2 x daily - 7 x weekly - 2 sets - 2 reps - 30 sec hold - Gentle Levator Scapulae Stretch  - 2 x daily - 7 x weekly - 2 sets - 2 reps - 30 sec hold - Seated Thoracic Extension AROM  - 1 x daily - 7 x weekly - 2 sets - 10 reps - Seated Shoulder Flexion Towel Slide at Table Top  - 1 x daily - 7 x weekly - 2 sets - 10 reps - Seated Shoulder Scaption Slide at Table Top with Forearm in Neutral  - 1 x daily - 7 x weekly - 2 sets - 10 reps - Seated Scapular Retraction  - 1 x daily - 7 x weekly - 2 sets - 10 reps   ASSESSMENT:  CLINICAL IMPRESSION: Pt is showing more ability  to ER his L shoulder. He is showing good strength with the exercises, requiring tactile cuing to keep his scapula in good position. Continued with manual for L shoulder stretching into ER with PNF utilized for neuromuscular retraining. [place]uong will benefit from continued skilled PT to address above deficits to improve mobility and activity tolerance with  decreased pain interference.  OBJECTIVE IMPAIRMENTS: decreased activity tolerance, decreased ROM, decreased strength, increased edema, increased fascial restrictions, impaired flexibility, and pain.   ACTIVITY LIMITATIONS: carrying, lifting, sleeping, bathing, toileting, dressing, and reach over head  PARTICIPATION LIMITATIONS: meal prep, cleaning, laundry, driving, shopping, community activity, and yard work  PERSONAL FACTORS: Fitness, Past/current experiences, Time since onset of injury/illness/exacerbation, Transportation, and 1-2 comorbidities: psoriasis, LBP  are also affecting patient's functional outcome.   REHAB POTENTIAL: Good  CLINICAL DECISION MAKING: Stable/uncomplicated  EVALUATION COMPLEXITY: Low   GOALS: Goals reviewed with patient? Yes  SHORT TERM GOALS: Target date: 4 weeks, 12/02/22  ROM L shoulder flex , abd 90, ER 30 Baseline:flex 70, abd 80, Er -18 Goal status: IN PROGRESS- met for flexion- 11/29/22  LONG TERM GOALS: Target date: 12 weeks: 01/27/23, extended to 02/22/23  I HEP Baseline: TBD Goal status: Met for current HEP- 11/22/22  2.  L shoulder ROM for elevation 130 or greater for functional reach, ADL's Baseline:  Goal status: IN PROGRESS- 12/16/22 12/30/22 see charts above 01/27/23: met so far  3.  L shoulder ER 60 or greater for functional reach, IR hand behind back to L 1 for dressing, bathing  Baseline:  Goal status: IN PROGRESS- 12/16/22 see charts above 12/30/22 progressing see above charts 01/27/23: progressing measured 40 degrees today  4.  Quick DASH L shoulder 25% or less Baseline: 79.5% disability Goal status: IN PROGRESS  5.  Strength L shoulder 4+/5 all planes for ability to play golf, perform yard work, lifting Baseline:  Goal status: IN PROGRESS 12/30/22 see above MMT  01/27/23: goal met   PLAN:  PT FREQUENCY: 2x/week  PT DURATION: 12 weeks  PLANNED INTERVENTIONS: Therapeutic exercises, Therapeutic activity, Neuromuscular  re-education, Balance training, Gait training, Patient/Family education, Self Care, and Joint mobilization  PLAN FOR NEXT SESSION: continue PROM and AAROM , try to engage with Er as much as pt can tolerate, progress with resistance, add more rhythmic stabilization   Samhita Kretsch L Mekaila Tarnow, PTA 01/31/2023, 11:01 AM

## 2023-02-02 ENCOUNTER — Ambulatory Visit: Payer: BC Managed Care – PPO

## 2023-02-02 DIAGNOSIS — Z8781 Personal history of (healed) traumatic fracture: Secondary | ICD-10-CM

## 2023-02-02 DIAGNOSIS — M25612 Stiffness of left shoulder, not elsewhere classified: Secondary | ICD-10-CM

## 2023-02-02 DIAGNOSIS — M6281 Muscle weakness (generalized): Secondary | ICD-10-CM

## 2023-02-02 DIAGNOSIS — M25512 Pain in left shoulder: Secondary | ICD-10-CM

## 2023-02-02 NOTE — Therapy (Signed)
OUTPATIENT PHYSICAL THERAPY TREATMENT         Patient Name: Isaiah Gibson MRN: [id number] DOB:[birth year], 37 y.o., male Today's Date: 02/02/2023  END OF SESSION:      Past Medical History:  Diagnosis Date   ADD 10/20/2006   BACK PAIN 05/15/2007   GERD 10/20/2006   HEMATOCHEZIA 12/18/2009   INSOMNIA-SLEEP DISORDER-UNSPEC 05/15/2007   Palpitations 01/25/2008   POLYCYTHEMIA 05/20/2009   PSORIASIS 10/20/2006   SMOKER 05/15/2007   VENEREAL WART 05/15/2007   Past Surgical History:  Procedure Laterality Date   HAND SURGERY Right    ORIF SHOULDER FRACTURE Left 10/08/2022   Procedure: OPEN REDUCTION INTERNAL FIXATION (ORIF) GREATER TUBEROSITY FRACTURE;  Surgeon: Cammy Copa, MD;  Location: [facility] OR;  Service: Orthopedics;  Laterality: Left;  HANDY BED   WISDOM TOOTH EXTRACTION     Patient Active Problem List   Diagnosis Date Noted   Closed fracture dislocation of shoulder with routine healing 10/09/2022   Obesity (BMI 35.0-39.9 without comorbidity) 07/22/2022   Vitamin D deficiency 07/22/2022   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   Hematospermia 09/11/2020   Fatigue 09/27/2019   Erectile dysfunction 09/27/2019   Upper respiratory tract infection 09/27/2019   Screening-pulmonary TB 04/23/2017   Anxiety 04/14/2016   Panic attacks 04/14/2016   Weight gain 11/26/2015   Former smoker 11/26/2015   Pectoralis muscle strain 04/22/2015   Tendinitis of forearm 04/22/2015   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 04/16/2012   Dizziness 01/25/2011   Urinary frequency 01/25/2011   Bilateral leg edema 01/25/2011   Encounter for well adult exam with abnormal findings 01/02/2011   HEMATOCHEZIA 12/18/2009   WRIST PAIN, RIGHT 12/09/2009   ANKLE PAIN, RIGHT 12/09/2009   POLYCYTHEMIA 05/20/2009   RASH-NONVESICULAR 09/04/2008   ANKLE SPRAIN, LEFT 05/31/2008   Palpitations 01/25/2008   VENEREAL WART 05/15/2007   SMOKER 05/15/2007   BACK PAIN 05/15/2007   INSOMNIA-SLEEP  DISORDER-UNSPEC 05/15/2007   Attention deficit disorder 10/20/2006   GERD 10/20/2006   PSORIASIS 10/20/2006    PCP: Oliver Barre, MD  REFERRING PROVIDER: Julieanne Cotton, PA-C  REFERRING DIAG: L proximal humerus fx and dislocation, s/p ORIF  THERAPY DIAG:  History of left shoulder fracture  Stiffness of left shoulder, not elsewhere classified  Muscle weakness of left arm  Acute pain of left shoulder  Rationale for Evaluation and Treatment: Rehabilitation  ONSET DATE: 10/08/22  NEXT MD VISIT: 02/27/22   SUBJECTIVE:                                                                                                                                                                                      SUBJECTIVE STATEMENT:  Not much change.  Hand dominance: Right  PERTINENT HISTORY: Open reduction and internal fixation of left proximal humerus fracture.   PAIN:  Are you having pain? Yes: NPRS scale: 0/10 Pain location: L neck and shoulder into scapula  Pain description: stiff, aching, worse in am, when wakes up Aggravating factors: lying down Relieving factors: oxycodone, muscle relaxer  PRECAUTIONS: Other: As of 11/03/22 ortho PA notes: He is okay for full passive range of motion and okay to start active assisted range of motion.  No full active range of motion or rotator cuff strengthening exercises until cleared at his next appointment.  Follow-up in 4 weeks for clinical recheck with Dr. August Saucer.  RED FLAGS: None   WEIGHT BEARING RESTRICTIONS: Yes NWB L UE  FALLS:  Has patient fallen in last 6 months? Yes. Number of falls 1  LIVING ENVIRONMENT: Lives with: lives with their family Lives in: House/apartment Stairs: Yes: Internal: 11 steps; on left going up and External: 3 steps; on right going up Has following equipment at home: None  OCCUPATION: Multimedia programmer, on computer  PLOF: Independent  PATIENT GOALS:to be able to participate in golfing, activities, with  family, use L arm for dressing, bathing, I   OBJECTIVE:   DIAGNOSTIC FINDINGS:  Images available in epic chart review. Has plate and 8 screws L prox humerus  PATIENT SURVEYS:  Quick Dash 79.5%  COGNITION: Overall cognitive status: Within functional limits for tasks assessed     SENSATION: WFL  POSTURE: Soft sling L shoulder. Rounding L shoulder  UPPER EXTREMITY ROM: PROM L shoulder flexion 70, abd 80, ER -18 R shoulder wfl  Passive ROM Left eval L 11/29/22 AAROM with wand L 12/16/22  L AROM 12/21/22  L AROM 12/30/22 L AAROM 01/06/23 L AROM/ AAROM 01/27/23  02/02/23 L  Shoulder flexion 70 105 122 130 142  132, 152 140  Shoulder extension          Shoulder abduction 80 115 130 112 130  118/155   Shoulder adduction          Shoulder internal rotation    L PSIS   L PSIS L4-L5  Shoulder external rotation -18 10 17  L habd behind base of neck 28 39 40/43 45  Elbow flexion          Elbow extension          Wrist flexion          Wrist extension          Wrist ulnar deviation          Wrist radial deviation          Wrist pronation          Wrist supination           (Blank rows = not tested)  UPPER EXTREMITY MMT: L UE MMT not performed due to recent injury , restrictions. Did note active contraction of all shoulder musculature and elbow, wrist, hand musculature  R hand and wrist deficits with grasp strength due to previous traumatic injury 12/30/22: L shoulder flex 3+ L shoulder abd 3+ L shoulder ER 4 L shoulder IR 4  SHOULDER SPECIAL TESTS: NA due to orthopedic restrictions   JOINT MOBILITY TESTING:  na  PALPATION:  Mild undulation L lateral upper arm mild edema L upper arm    TODAY'S TREATMENT:  DATE: 02/02/23 UBE L2.0 3 min each way Pt completed QuickDash Lat pulldowns 20# 2x10 Rows 45# 3x10 AA L shoulder ER with GTB  10x5" ER GTB x 10  PROM + contract relax for ER   01/31/23 UBE L2.0 3 min each way Supine L shoulder flexion 2lb 10x with 5 second hold Supine L shoulder CW/CCW circles with 2lb weight x 20 each  S/L L shoulder ER 2x10 2lb S/L L shoulder abduction 2x10 2lb L shoulder contract/relax for ER MWM L shoulder ER and abduction  01/27/23:  UBE 3 min F, 3 min B, level 2 Supine for manual stretching L shoulder , all planes, with inferior glides L gh jt, medial scapular glides  Side lying R for horizontal adduction,for book openers,with 3# cuff wt , therapist stabilizing L scapula Prone for HVLT mid and upper thoracic spine Deep massage with massage gun L post/inferior shoulder musculature  01/18/23: Manual:  supine for manual stretching L shoulder , all planes, with distraction of L humeral head. Inferior glides L humeral head, with L shoulder abducted to 90 and to 110, 45 sec bouts Prone for HVLA mid and lower thoracic spine Prone for deep massage with theragun L post shoulder musculature, L periscapular musculature  Therapeutic exercise:  Supine pec flys with 3# cuff wt L wrist shoulder at 60 and 90 degrees abd, mass practice Supine for isometric shoulder abd with B shoulder flexion mass practice Seated rows, hands horizontal, 45# 3 x 10 Seated forward shoulder press 15# 3 x 10  01/11/23: Therapeutic exercise: combined with manual techniques to address L scapular rhythm and centralization of humeral head.  Patient positioned in supine for stretching L shoulder into flex, abd, ER, all with varying GH jt distraction, also contract /relax at end range Supine inf glides humeral head with shoulder in 80 degrees abd, 3 bouts 45 sec Supine for stretching with 3 # wt into horizontal abduction, at 60 degrees, 90 degrees, 110 degrees, with pec fly, therapist lightly supporting under arm protect with stretch, mas practice Side lying on R for open books, 3# cuff wt 15 reps, therapist manually  stabilizing /depressing scapula Side lying for L shoulder abd with therapist distracting humeral head and performing post glide stretch mas practice Massage gun for deep pressure, cross friction mass L post shoulder capsule, scapular retractors  BATCA shoulder rows 2 x 10 hands vertical and hands horizontal, with 25# BATCA shoulder press 20#, 3 x 10  Moist heat L shoulder after session, not included in treatment   PATIENT EDUCATION: Education details: HEP review and HEP modification - use of Swiffer as alternative for table slides Person educated: Patient and Spouse Education method: Explanation, Demonstration, Tactile cues, and Verbal cues Education comprehension: verbalized understanding and returned demonstration  HOME EXERCISE PROGRAM: Access Code: [email or web address] URL: [email or web address] Date: 11/11/2022 Prepared by: Verta Ellen  Exercises - Seated Upper Trapezius Stretch  - 2 x daily - 7 x weekly - 2 sets - 2 reps - 30 sec hold - Gentle Levator Scapulae Stretch  - 2 x daily - 7 x weekly - 2 sets - 2 reps - 30 sec hold - Seated Thoracic Extension AROM  - 1 x daily - 7 x weekly - 2 sets - 10 reps - Seated Shoulder Flexion Towel Slide at Table Top  - 1 x daily - 7 x weekly - 2 sets - 10 reps - Seated Shoulder Scaption Slide at Table Top with Forearm in Neutral  - 1 x daily - 7  x weekly - 2 sets - 10 reps - Seated Scapular Retraction  - 1 x daily - 7 x weekly - 2 sets - 10 reps   ASSESSMENT:  CLINICAL IMPRESSION: Pt is showing more ability to ER his L shoulder.  Continued with manual for L shoulder stretching into ER with PNF utilized for neuromuscular retraining. Strengthening also to improve shoulder stability. Improvements made in shoulder flex, ER and IR today. [place] will benefit from continued skilled PT to address above deficits to improve mobility and activity tolerance with decreased pain interference.  OBJECTIVE IMPAIRMENTS: decreased activity tolerance,  decreased ROM, decreased strength, increased edema, increased fascial restrictions, impaired flexibility, and pain.   ACTIVITY LIMITATIONS: carrying, lifting, sleeping, bathing, toileting, dressing, and reach over head  PARTICIPATION LIMITATIONS: meal prep, cleaning, laundry, driving, shopping, community activity, and yard work  PERSONAL FACTORS: Fitness, Past/current experiences, Time since onset of injury/illness/exacerbation, Transportation, and 1-2 comorbidities: psoriasis, LBP  are also affecting patient's functional outcome.   REHAB POTENTIAL: Good  CLINICAL DECISION MAKING: Stable/uncomplicated  EVALUATION COMPLEXITY: Low   GOALS: Goals reviewed with patient? Yes  SHORT TERM GOALS: Target date: 4 weeks, 12/02/22  ROM L shoulder flex , abd 90, ER 30 Baseline:flex 70, abd 80, Er -18 Goal status: IN PROGRESS- met for flexion- 11/29/22  LONG TERM GOALS: Target date: 12 weeks: 01/27/23, extended to 02/22/23  I HEP Baseline: TBD Goal status: Met for current HEP- 11/22/22  2.  L shoulder ROM for elevation 130 or greater for functional reach, ADL's Baseline:  Goal status: IN PROGRESS- 12/16/22 12/30/22 see charts above 01/27/23: met so far  3.  L shoulder ER 60 or greater for functional reach, IR hand behind back to L 1 for dressing, bathing  Baseline:  Goal status: IN PROGRESS- 12/16/22 see charts above 12/30/22 progressing see above charts 01/27/23: progressing measured 40 degrees today  4.  Quick DASH L shoulder 25% or less Baseline: 79.5% disability Goal status: IN PROGRESS- 02/02/23 38.6 / 100 = 38.6 %  5.  Strength L shoulder 4+/5 all planes for ability to play golf, perform yard work, lifting Baseline:  Goal status: IN PROGRESS 12/30/22 see above MMT  01/27/23: goal met   PLAN:  PT FREQUENCY: 2x/week  PT DURATION: 12 weeks  PLANNED INTERVENTIONS: Therapeutic exercises, Therapeutic activity, Neuromuscular re-education, Balance training, Gait training,  Patient/Family education, Self Care, and Joint mobilization  PLAN FOR NEXT SESSION: continue PROM and AAROM , try to engage with Er as much as pt can tolerate, progress with resistance, add more rhythmic stabilization   Jovan Colligan L Chanita Boden, PTA 02/02/2023, 2:11 PM

## 2023-02-10 ENCOUNTER — Ambulatory Visit: Payer: BC Managed Care – PPO

## 2023-02-10 DIAGNOSIS — Z8781 Personal history of (healed) traumatic fracture: Secondary | ICD-10-CM | POA: Diagnosis not present

## 2023-02-10 DIAGNOSIS — M25512 Pain in left shoulder: Secondary | ICD-10-CM

## 2023-02-10 DIAGNOSIS — M25612 Stiffness of left shoulder, not elsewhere classified: Secondary | ICD-10-CM

## 2023-02-10 DIAGNOSIS — M6281 Muscle weakness (generalized): Secondary | ICD-10-CM

## 2023-02-10 NOTE — Therapy (Signed)
OUTPATIENT PHYSICAL THERAPY TREATMENT         Patient Name: Isaiah Gibson MRN: [id number] DOB:1985/04/19, 37 y.o., male Today's Date: 02/10/2023  END OF SESSION:  PT End of Session - 02/10/23 0810     Visit Number 22    Date for PT Re-Evaluation 02/22/23    Progress Note Due on Visit 30    PT Start Time 0806   pt late   PT Stop Time 0844    PT Time Calculation (min) 38 min    Activity Tolerance Patient tolerated treatment well    Behavior During Therapy [facility] for tasks assessed/performed                Past Medical History:  Diagnosis Date   ADD 10/20/2006   BACK PAIN 05/15/2007   GERD 10/20/2006   HEMATOCHEZIA 12/18/2009   INSOMNIA-SLEEP DISORDER-UNSPEC 05/15/2007   Palpitations 01/25/2008   POLYCYTHEMIA 05/20/2009   PSORIASIS 10/20/2006   SMOKER 05/15/2007   VENEREAL WART 05/15/2007   Past Surgical History:  Procedure Laterality Date   HAND SURGERY Right    ORIF SHOULDER FRACTURE Left 10/08/2022   Procedure: OPEN REDUCTION INTERNAL FIXATION (ORIF) GREATER TUBEROSITY FRACTURE;  Surgeon: Cammy Copa, MD;  Location: [facility] OR;  Service: Orthopedics;  Laterality: Left;  HANDY BED   WISDOM TOOTH EXTRACTION     Patient Active Problem List   Diagnosis Date Noted   Closed fracture dislocation of shoulder with routine healing 10/09/2022   Obesity (BMI 35.0-39.9 without comorbidity) 07/22/2022   Vitamin D deficiency 07/22/2022   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   Hematospermia 09/11/2020   Fatigue 09/27/2019   Erectile dysfunction 09/27/2019   Upper respiratory tract infection 09/27/2019   Screening-pulmonary TB 04/23/2017   Anxiety 04/14/2016   Panic attacks 04/14/2016   Weight gain 11/26/2015   Former smoker 11/26/2015   Pectoralis muscle strain 04/22/2015   Tendinitis of forearm 04/22/2015   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 04/16/2012   Dizziness 01/25/2011   Urinary frequency 01/25/2011   Bilateral leg edema 01/25/2011   Encounter for well  adult exam with abnormal findings 01/02/2011   HEMATOCHEZIA 12/18/2009   WRIST PAIN, RIGHT 12/09/2009   ANKLE PAIN, RIGHT 12/09/2009   POLYCYTHEMIA 05/20/2009   RASH-NONVESICULAR 09/04/2008   ANKLE SPRAIN, LEFT 05/31/2008   Palpitations 01/25/2008   VENEREAL WART 05/15/2007   SMOKER 05/15/2007   BACK PAIN 05/15/2007   INSOMNIA-SLEEP DISORDER-UNSPEC 05/15/2007   Attention deficit disorder 10/20/2006   GERD 10/20/2006   PSORIASIS 10/20/2006    PCP: Oliver Barre, MD  REFERRING PROVIDER: Julieanne Cotton, PA-C  REFERRING DIAG: L proximal humerus fx and dislocation, s/p ORIF  THERAPY DIAG:  History of left shoulder fracture  Stiffness of left shoulder, not elsewhere classified  Muscle weakness of left arm  Acute pain of left shoulder  Rationale for Evaluation and Treatment: Rehabilitation  ONSET DATE: 10/08/22  NEXT MD VISIT: 02/27/22   SUBJECTIVE:  SUBJECTIVE STATEMENT: Pt reports he went to [employer] over the weekend, hit the ball really hard, felt a pop in his L shoulder but he is able to move it with no increased pain.   Hand dominance: Right  PERTINENT HISTORY: Open reduction and internal fixation of left proximal humerus fracture.   PAIN:  Are you having pain? Yes: NPRS scale: 0/10 Pain location: L neck and shoulder into scapula  Pain description: stiff, aching, worse in am, when wakes up Aggravating factors: lying down Relieving factors: oxycodone, muscle relaxer  PRECAUTIONS: Other: As of 11/03/22 ortho PA notes: He is okay for full passive range of motion and okay to start active assisted range of motion.  No full active range of motion or rotator cuff strengthening exercises until cleared at his next appointment.  Follow-up in 4 weeks for clinical recheck with Dr. August Saucer.  RED  FLAGS: None   WEIGHT BEARING RESTRICTIONS: Yes NWB L UE  FALLS:  Has patient fallen in last 6 months? Yes. Number of falls 1  LIVING ENVIRONMENT: Lives with: lives with their family Lives in: House/apartment Stairs: Yes: Internal: 11 steps; on left going up and External: 3 steps; on right going up Has following equipment at home: None  OCCUPATION: Multimedia programmer, on computer  PLOF: Independent  PATIENT GOALS:to be able to participate in golfing, activities, with family, use L arm for dressing, bathing, I   OBJECTIVE:   DIAGNOSTIC FINDINGS:  Images available in epic chart review. Has plate and 8 screws L prox humerus  PATIENT SURVEYS:  Quick Dash 79.5%  COGNITION: Overall cognitive status: Within functional limits for tasks assessed     SENSATION: WFL  POSTURE: Soft sling L shoulder. Rounding L shoulder  UPPER EXTREMITY ROM: PROM L shoulder flexion 70, abd 80, ER -18 R shoulder wfl  Passive ROM Left eval L 11/29/22 AAROM with wand L 12/16/22  L AROM 12/21/22  L AROM 12/30/22 L AAROM 01/06/23 L AROM/ AAROM 01/27/23  02/02/23 L 02/10/23 L  Shoulder flexion 70 105 122 130 142  132, 152 140   Shoulder extension           Shoulder abduction 80 115 130 112 130  118/155    Shoulder adduction           Shoulder internal rotation    L PSIS   L PSIS L4-L5   Shoulder external rotation -18 10 17  L habd behind base of neck 28 39 40/43 45 45  Elbow flexion           Elbow extension           Wrist flexion           Wrist extension           Wrist ulnar deviation           Wrist radial deviation           Wrist pronation           Wrist supination            (Blank rows = not tested)  UPPER EXTREMITY MMT: L UE MMT not performed due to recent injury , restrictions. Did note active contraction of all shoulder musculature and elbow, wrist, hand musculature  R hand and wrist deficits with grasp strength due to previous traumatic injury 12/30/22: L shoulder flex 3+ L  shoulder abd 3+ L shoulder ER 4 L shoulder IR 4  SHOULDER SPECIAL TESTS: NA due to orthopedic restrictions  JOINT MOBILITY TESTING:  na  PALPATION:  Mild undulation L lateral upper arm mild edema L upper arm    TODAY'S TREATMENT:                                                                                                                                         DATE: 02/10/23 UBE L3.0 3 min each way Standing IR GTB 2x10 allowing band to stretch into ER eccentrically  Standing shoulder ext GTB x 10 Shoulder ER 3lb in sidelying 2x10 Shoulder abduction x 10 3lb sidelying Shoulder S/L IR 3lb 2x10 PROM + contract relax for ER   02/02/23 UBE L2.0 3 min each way Pt completed QuickDash Lat pulldowns 20# 2x10 Rows 45# 3x10 AA L shoulder ER with GTB 10x5" ER GTB x 10  PROM + contract relax for ER   01/31/23 UBE L2.0 3 min each way Supine L shoulder flexion 2lb 10x with 5 second hold Supine L shoulder CW/CCW circles with 2lb weight x 20 each  S/L L shoulder ER 2x10 2lb S/L L shoulder abduction 2x10 2lb L shoulder contract/relax for ER MWM L shoulder ER and abduction  01/27/23:  UBE 3 min F, 3 min B, level 2 Supine for manual stretching L shoulder , all planes, with inferior glides L gh jt, medial scapular glides  Side lying R for horizontal adduction,for book openers,with 3# cuff wt , therapist stabilizing L scapula Prone for HVLT mid and upper thoracic spine Deep massage with massage gun L post/inferior shoulder musculature  01/18/23: Manual:  supine for manual stretching L shoulder , all planes, with distraction of L humeral head. Inferior glides L humeral head, with L shoulder abducted to 90 and to 110, 45 sec bouts Prone for HVLA mid and lower thoracic spine Prone for deep massage with theragun L post shoulder musculature, L periscapular musculature  Therapeutic exercise:  Supine pec flys with 3# cuff wt L wrist shoulder at 60 and 90 degrees abd, mass  practice Supine for isometric shoulder abd with B shoulder flexion mass practice Seated rows, hands horizontal, 45# 3 x 10 Seated forward shoulder press 15# 3 x 10  01/11/23: Therapeutic exercise: combined with manual techniques to address L scapular rhythm and centralization of humeral head.  Patient positioned in supine for stretching L shoulder into flex, abd, ER, all with varying GH jt distraction, also contract /relax at end range Supine inf glides humeral head with shoulder in 80 degrees abd, 3 bouts 45 sec Supine for stretching with 3 # wt into horizontal abduction, at 60 degrees, 90 degrees, 110 degrees, with pec fly, therapist lightly supporting under arm protect with stretch, mas practice Side lying on R for open books, 3# cuff wt 15 reps, therapist manually stabilizing /depressing scapula Side lying for L shoulder abd with therapist distracting humeral head and performing post glide stretch mas practice Massage gun for deep pressure, cross  friction mass L post shoulder capsule, scapular retractors  BATCA shoulder rows 2 x 10 hands vertical and hands horizontal, with 25# BATCA shoulder press 20#, 3 x 10  Moist heat L shoulder after session, not included in treatment   PATIENT EDUCATION: Education details: HEP review and HEP modification - use of Swiffer as alternative for table slides Person educated: Patient and Spouse Education method: Explanation, Demonstration, Tactile cues, and Verbal cues Education comprehension: verbalized understanding and returned demonstration  HOME EXERCISE PROGRAM: Access Code: [email or web address] URL: [email or web address] Date: 11/11/2022 Prepared by: Verta Ellen  Exercises - Seated Upper Trapezius Stretch  - 2 x daily - 7 x weekly - 2 sets - 2 reps - 30 sec hold - Gentle Levator Scapulae Stretch  - 2 x daily - 7 x weekly - 2 sets - 2 reps - 30 sec hold - Seated Thoracic Extension AROM  - 1 x daily - 7 x weekly - 2 sets - 10 reps -  Seated Shoulder Flexion Towel Slide at Table Top  - 1 x daily - 7 x weekly - 2 sets - 10 reps - Seated Shoulder Scaption Slide at Table Top with Forearm in Neutral  - 1 x daily - 7 x weekly - 2 sets - 10 reps - Seated Scapular Retraction  - 1 x daily - 7 x weekly - 2 sets - 10 reps   ASSESSMENT:  CLINICAL IMPRESSION: Pt reports he felt his shoulder pop while swinging a golf club, he had no associated pain with movement. His ER ROM is the same (45 deg). Continued to work on strengthening and mobility to improve his function. Khamren will benefit from continued skilled PT to address above deficits to improve mobility and activity tolerance with decreased pain interference.  OBJECTIVE IMPAIRMENTS: decreased activity tolerance, decreased ROM, decreased strength, increased edema, increased fascial restrictions, impaired flexibility, and pain.   ACTIVITY LIMITATIONS: carrying, lifting, sleeping, bathing, toileting, dressing, and reach over head  PARTICIPATION LIMITATIONS: meal prep, cleaning, laundry, driving, shopping, community activity, and yard work  PERSONAL FACTORS: Fitness, Past/current experiences, Time since onset of injury/illness/exacerbation, Transportation, and 1-2 comorbidities: psoriasis, LBP  are also affecting patient's functional outcome.   REHAB POTENTIAL: Good  CLINICAL DECISION MAKING: Stable/uncomplicated  EVALUATION COMPLEXITY: Low   GOALS: Goals reviewed with patient? Yes  SHORT TERM GOALS: Target date: 4 weeks, 12/02/22  ROM L shoulder flex , abd 90, ER 30 Baseline:flex 70, abd 80, Er -18 Goal status: IN PROGRESS- met for flexion- 11/29/22  LONG TERM GOALS: Target date: 12 weeks: 01/27/23, extended to 02/22/23  I HEP Baseline: TBD Goal status: Met for current HEP- 11/22/22  2.  L shoulder ROM for elevation 130 or greater for functional reach, ADL's Baseline:  Goal status: IN PROGRESS- 12/16/22 12/30/22 see charts above 01/27/23: met so far  3.  L shoulder ER  60 or greater for functional reach, IR hand behind back to L 1 for dressing, bathing  Baseline:  Goal status: IN PROGRESS- 12/16/22 see charts above 12/30/22 progressing see above charts 01/27/23: progressing measured 40 degrees today  4.  Quick DASH L shoulder 25% or less Baseline: 79.5% disability Goal status: IN PROGRESS- 02/02/23 38.6 / 100 = 38.6 %  5.  Strength L shoulder 4+/5 all planes for ability to play golf, perform yard work, lifting Baseline:  Goal status: IN PROGRESS 12/30/22 see above MMT  01/27/23: goal met    PLAN:  PT FREQUENCY: 2x/week  PT DURATION: 12 weeks  PLANNED INTERVENTIONS: Therapeutic  exercises, Therapeutic activity, Neuromuscular re-education, Balance training, Gait training, Patient/Family education, Self Care, and Joint mobilization  PLAN FOR NEXT SESSION: continue PROM and AAROM , try to engage with Er as much as pt can tolerate, progress with resistance, add more rhythmic stabilization   Kasidy Gianino L Mozella Rexrode, PTA 02/10/2023, 8:47 AM

## 2023-02-22 ENCOUNTER — Other Ambulatory Visit: Payer: Self-pay

## 2023-02-22 ENCOUNTER — Ambulatory Visit: Payer: BC Managed Care – PPO

## 2023-02-22 DIAGNOSIS — M25612 Stiffness of left shoulder, not elsewhere classified: Secondary | ICD-10-CM

## 2023-02-22 DIAGNOSIS — M25512 Pain in left shoulder: Secondary | ICD-10-CM

## 2023-02-22 DIAGNOSIS — Z8781 Personal history of (healed) traumatic fracture: Secondary | ICD-10-CM | POA: Diagnosis not present

## 2023-02-22 DIAGNOSIS — M6281 Muscle weakness (generalized): Secondary | ICD-10-CM

## 2023-02-22 NOTE — Therapy (Signed)
 OUTPATIENT PHYSICAL THERAPY TREATMENT/DISCHARGE SUMMARY      Progress Note Reporting Period 11/04/22 to 02/22/23  See note below for Objective Data and Assessment of Progress/Goals.       Patient Name: Isaiah Gibson MRN: [id number] DOB:[birth year], 37 y.o., male Today's Date: 02/22/2023  END OF SESSION:  PT End of Session - 02/22/23 0929     Visit Number 23    Date for PT Re-Evaluation 02/22/23    Progress Note Due on Visit 30    PT Start Time 0930    PT Stop Time 1015    PT Time Calculation (min) 45 min    Activity Tolerance Patient tolerated treatment well    Behavior During Therapy [facility] for tasks assessed/performed                 Past Medical History:  Diagnosis Date   ADD 10/20/2006   BACK PAIN 05/15/2007   GERD 10/20/2006   HEMATOCHEZIA 12/18/2009   INSOMNIA-SLEEP DISORDER-UNSPEC 05/15/2007   Palpitations 01/25/2008   POLYCYTHEMIA 05/20/2009   PSORIASIS 10/20/2006   SMOKER 05/15/2007   VENEREAL WART 05/15/2007   Past Surgical History:  Procedure Laterality Date   HAND SURGERY Right    ORIF SHOULDER FRACTURE Left 10/08/2022   Procedure: OPEN REDUCTION INTERNAL FIXATION (ORIF) GREATER TUBEROSITY FRACTURE;  Surgeon: Addie Cordella Hamilton, MD;  Location: [facility] OR;  Service: Orthopedics;  Laterality: Left;  HANDY BED   WISDOM TOOTH EXTRACTION     Patient Active Problem List   Diagnosis Date Noted   Closed fracture dislocation of shoulder with routine healing 10/09/2022   Obesity (BMI 35.0-39.9 without comorbidity) 07/22/2022   Vitamin D  deficiency 07/22/2022   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   Hematospermia 09/11/2020   Fatigue 09/27/2019   Erectile dysfunction 09/27/2019   Upper respiratory tract infection 09/27/2019   Screening-pulmonary TB 04/23/2017   Anxiety 04/14/2016   Panic attacks 04/14/2016   Weight gain 11/26/2015   Former smoker 11/26/2015   Pectoralis muscle strain 04/22/2015   Tendinitis of forearm 04/22/2015   Alcohol ingestion of more  than 4 drinks/week 04/16/2012   Dizziness 01/25/2011   Urinary frequency 01/25/2011   Bilateral leg edema 01/25/2011   Encounter for well adult exam with abnormal findings 01/02/2011   HEMATOCHEZIA 12/18/2009   WRIST PAIN, RIGHT 12/09/2009   ANKLE PAIN, RIGHT 12/09/2009   POLYCYTHEMIA 05/20/2009   RASH-NONVESICULAR 09/04/2008   ANKLE SPRAIN, LEFT 05/31/2008   Palpitations 01/25/2008   VENEREAL WART 05/15/2007   SMOKER 05/15/2007   BACK PAIN 05/15/2007   INSOMNIA-SLEEP DISORDER-UNSPEC 05/15/2007   Attention deficit disorder 10/20/2006   GERD 10/20/2006   PSORIASIS 10/20/2006    PCP: Lynwood Rush, MD  REFERRING PROVIDER: Shirly Carlin CROME, PA-C  REFERRING DIAG: L proximal humerus fx and dislocation, s/p ORIF  THERAPY DIAG:  History of left shoulder fracture  Stiffness of left shoulder, not elsewhere classified  Acute pain of left shoulder  Muscle weakness of left arm  Rationale for Evaluation and Treatment: Rehabilitation  ONSET DATE: 10/08/22  NEXT MD VISIT: 02/27/22   SUBJECTIVE:  SUBJECTIVE STATEMENT: Feeling better but can't reach across his body, /horizontal abduction, also trying to install new lights in his house and has a hard time holding his arms up long enough  Hand dominance: Right  PERTINENT HISTORY: Open reduction and internal fixation of left proximal humerus fracture.   PAIN:  Are you having pain? Yes: NPRS scale: 0/10 Pain location: L neck and shoulder into scapula  Pain description: stiff, aching, worse in am, when wakes up Aggravating factors: lying down Relieving factors: oxycodone , muscle relaxer  PRECAUTIONS: Other: As of 11/03/22 ortho PA notes: He is okay for full passive range of motion and okay to start active assisted range of motion.  No full active range of  motion or rotator cuff strengthening exercises until cleared at his next appointment.  Follow-up in 4 weeks for clinical recheck with Dr. Addie.  RED FLAGS: None   WEIGHT BEARING RESTRICTIONS: Yes NWB L UE  FALLS:  Has patient fallen in last 6 months? Yes. Number of falls 1  LIVING ENVIRONMENT: Lives with: lives with their family Lives in: House/apartment Stairs: Yes: Internal: 11 steps; on left going up and External: 3 steps; on right going up Has following equipment at home: None  OCCUPATION: Multimedia programmer, on computer  PLOF: Independent  PATIENT GOALS:to be able to participate in golfing, activities, with family, use L arm for dressing, bathing, I   OBJECTIVE:   DIAGNOSTIC FINDINGS:  Images available in epic chart review. Has plate and 8 screws L prox humerus  PATIENT SURVEYS:  Quick Dash 79.5%  COGNITION: Overall cognitive status: Within functional limits for tasks assessed     SENSATION: WFL  POSTURE: Soft sling L shoulder. Rounding L shoulder  UPPER EXTREMITY ROM: PROM L shoulder flexion 70, abd 80, ER -18 R shoulder wfl  Passive ROM Left eval L 11/29/22 AAROM with wand L 12/16/22  L AROM 12/21/22  L AROM 12/30/22 L AAROM 01/06/23 L AROM/ AAROM 01/27/23  02/02/23 L 02/10/23 L 02/22/23  Shoulder flexion 70 105 122 130 142  132, 152 140  156  Shoulder extension            Shoulder abduction 80 115 130 112 130  118/155     Shoulder adduction            Shoulder internal rotation    L PSIS   L PSIS L4-L5  L4  Shoulder external rotation -18 10 17  L habd behind base of neck 28 39 40/43 45 45 58  Elbow flexion            Elbow extension            Wrist flexion            Wrist extension            Wrist ulnar deviation            Wrist radial deviation            Wrist pronation            Wrist supination             (Blank rows = not tested)  UPPER EXTREMITY MMT: L UE MMT not performed due to recent injury , restrictions. Did note active  contraction of all shoulder musculature and elbow, wrist, hand musculature  R hand and wrist deficits with grasp strength due to previous traumatic injury 12/30/22: L shoulder flex 3+ L shoulder abd 3+ L shoulder ER 4 L shoulder  IR 4  02/22/23: All MMT for L shoulder WNL fo burst testing, patient with some giving/fatigue with sustained hold with flexion, abduction and ER  SHOULDER SPECIAL TESTS: NA due to orthopedic restrictions   JOINT MOBILITY TESTING:  na  PALPATION:  Mild undulation L lateral upper arm mild edema L upper arm    TODAY'S TREATMENT:                                                                                                                                         DATE: 02/22/23:  Supine for AAROM Inf glides L humeral head, 45 sec bouts, 2 sets  Mobilization with movement for L shoulder flexion, lateral /inf glides L humeral head  Prone for PA glides thoracic spine and post ribs, HVLA T6-7  Deep myofascial release, soft tissue massage L post shoulder axillary musculature, infraspinatus, teres minor, teres major, lats Prone L shoulder ext 7#, 3 x 10 Seated rows , 55#, hands horizontal, 3 x 10 Chest press 35 #, hands horizontal 3 x 10 Standing L shoulder horizontal abd, 6# ,3 x 10 Biceps curls 6#, 30 reps  02/10/23 UBE L3.0 3 min each way Standing IR GTB 2x10 allowing band to stretch into ER eccentrically  Standing shoulder ext GTB x 10 Shoulder ER 3lb in sidelying 2x10 Shoulder abduction x 10 3lb sidelying Shoulder S/L IR 3lb 2x10 PROM + contract relax for ER   02/02/23 UBE L2.0 3 min each way Pt completed QuickDash Lat pulldowns 20# 2x10 Rows 45# 3x10 AA L shoulder ER with GTB 10x5 ER GTB x 10  PROM + contract relax for ER   01/31/23 UBE L2.0 3 min each way Supine L shoulder flexion 2lb 10x with 5 second hold Supine L shoulder CW/CCW circles with 2lb weight x 20 each  S/L L shoulder ER 2x10 2lb S/L L shoulder abduction 2x10 2lb L shoulder  contract/relax for ER MWM L shoulder ER and abduction  01/27/23:  UBE 3 min F, 3 min B, level 2 Supine for manual stretching L shoulder , all planes, with inferior glides L gh jt, medial scapular glides  Side lying R for horizontal adduction,for book openers,with 3# cuff wt , therapist stabilizing L scapula Prone for HVLT mid and upper thoracic spine Deep massage with massage gun L post/inferior shoulder musculature  01/18/23: Manual:  supine for manual stretching L shoulder , all planes, with distraction of L humeral head. Inferior glides L humeral head, with L shoulder abducted to 90 and to 110, 45 sec bouts Prone for HVLA mid and lower thoracic spine Prone for deep massage with theragun L post shoulder musculature, L periscapular musculature  Therapeutic exercise:  Supine pec flys with 3# cuff wt L wrist shoulder at 60 and 90 degrees abd, mass practice Supine for isometric shoulder abd with B shoulder flexion mass practice Seated rows, hands horizontal, 45# 3 x 10 Seated  forward shoulder press 15# 3 x 10  01/11/23: Therapeutic exercise: combined with manual techniques to address L scapular rhythm and centralization of humeral head.  Patient positioned in supine for stretching L shoulder into flex, abd, ER, all with varying GH jt distraction, also contract /relax at end range Supine inf glides humeral head with shoulder in 80 degrees abd, 3 bouts 45 sec Supine for stretching with 3 # wt into horizontal abduction, at 60 degrees, 90 degrees, 110 degrees, with pec fly, therapist lightly supporting under arm protect with stretch, mas practice Side lying on R for open books, 3# cuff wt 15 reps, therapist manually stabilizing /depressing scapula Side lying for L shoulder abd with therapist distracting humeral head and performing post glide stretch mas practice Massage gun for deep pressure, cross friction mass L post shoulder capsule, scapular retractors  BATCA shoulder rows 2 x 10 hands  vertical and hands horizontal, with 25# BATCA shoulder press 20#, 3 x 10  Moist heat L shoulder after session, not included in treatment   PATIENT EDUCATION: Education details: HEP review and HEP modification - use of Swiffer as alternative for table slides Person educated: Patient and Spouse Education method: Explanation, Demonstration, Tactile cues, and Verbal cues Education comprehension: verbalized understanding and returned demonstration  HOME EXERCISE PROGRAM: Access Code: [email or web address] URL: [email or web address] Date: 11/11/2022 Prepared by: Braylin Clark  Exercises - Seated Upper Trapezius Stretch  - 2 x daily - 7 x weekly - 2 sets - 2 reps - 30 sec hold - Gentle Levator Scapulae Stretch  - 2 x daily - 7 x weekly - 2 sets - 2 reps - 30 sec hold - Seated Thoracic Extension AROM  - 1 x daily - 7 x weekly - 2 sets - 10 reps - Seated Shoulder Flexion Towel Slide at Table Top  - 1 x daily - 7 x weekly - 2 sets - 10 reps - Seated Shoulder Scaption Slide at Table Top with Forearm in Neutral  - 1 x daily - 7 x weekly - 2 sets - 10 reps - Seated Scapular Retraction  - 1 x daily - 7 x weekly - 2 sets - 10 reps   ASSESSMENT:  CLINICAL IMPRESSION: Pt returned today for skilled physical therapy to address his ongoing L shoulder dysfunction.  Overall he has made excellent progress with strength and ROM, considering the extensive injuries and surgical repair that he went through.  He is meeting with the surgeon on 02/27/22 to discuss a possible manipulation.  He continues to be consistently sore L post axillary musculature and reports difficulty with household activities which require sustained lifting. at this point we are discharging physical therapy.  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    OBJECTIVE IMPAIRMENTS: decreased activity tolerance, decreased ROM, decreased  strength, increased edema, increased fascial restrictions, impaired flexibility, and pain.   ACTIVITY LIMITATIONS: carrying, lifting, sleeping, bathing, toileting, dressing, and reach over head  PARTICIPATION LIMITATIONS: meal prep, cleaning, laundry, driving, shopping, community activity, and yard work  PERSONAL FACTORS: Fitness, Past/current experiences, Time since onset of injury/illness/exacerbation, Transportation, and 1-2 comorbidities: psoriasis, LBP  are also affecting patient's functional outcome.   REHAB POTENTIAL: Good  CLINICAL DECISION MAKING: Stable/uncomplicated  EVALUATION COMPLEXITY: Low   GOALS: Goals reviewed with patient? Yes  SHORT TERM GOALS: Target date: 4 weeks, 12/02/22  ROM L shoulder flex , abd 90, ER 30  Baseline:flex 70, abd 80, Er -18 Goal status: IN PROGRESS- met for flexion- 11/29/22  LONG TERM GOALS: Target date: 12 weeks: 01/27/23, extended to 02/22/23  I HEP Baseline: TBD Goal status: Met for current HEP- 11/22/22 02/22/23: goal met   2.  L shoulder ROM for elevation 130 or greater for functional reach, ADL's Baseline:  Goal status: IN PROGRESS- 12/16/22 12/30/22 see charts above 01/27/23: met so far 02/22/23: 154 today, met  3.  L shoulder ER 60 or greater for functional reach, IR hand behind back to L 1 for dressing, bathing  Baseline:  Goal status: IN PROGRESS- 12/16/22 see charts above 12/30/22 progressing see above charts 01/27/23: progressing measured 40 degrees today 02/22/23: ER measured at 45 initially, then to 56 after aggressive stretching  4.  Quick DASH L shoulder 25% or less Baseline: 79.5% disability Goal status: IN PROGRESS- 02/02/23 38.6 / 100 = 38.6 % 02/22/23: 13.6% goal met  5.  Strength L shoulder 4+/5 all planes for ability to play golf, perform yard work, lifting Baseline:  Goal status: IN PROGRESS 12/30/22 see above MMT  01/27/23: goal met  02/22/23: MMT /burst testing wnl today, however quickly fatigues and  unable to sustain for greater than a few sec with resistance for flex, abd, Er at terminal range   PLAN:  PT FREQUENCY: 2x/week  PT DURATION: 12 weeks  PLANNED INTERVENTIONS: Therapeutic exercises, Therapeutic activity, Neuromuscular re-education, Balance training, Gait training, Patient/Family education, Self Care, and Joint mobilization  PLAN FOR NEXT SESSION: Dc today , pt to surgeon 02/27/22   Decorian Schuenemann L Kyara Boxer, PT 02/22/2023, 9:36 AM

## 2023-02-23 ENCOUNTER — Other Ambulatory Visit: Payer: Self-pay | Admitting: Internal Medicine

## 2023-02-24 MED ORDER — AMPHETAMINE-DEXTROAMPHETAMINE 30 MG PO TABS: 30.0000 mg | ORAL_TABLET | Freq: Two times a day (BID) | ORAL | 0 refills | Status: DC

## 2023-02-28 ENCOUNTER — Ambulatory Visit: Payer: BC Managed Care – PPO | Admitting: Orthopedic Surgery

## 2023-02-28 DIAGNOSIS — S42252D Displaced fracture of greater tuberosity of left humerus, subsequent encounter for fracture with routine healing: Secondary | ICD-10-CM | POA: Diagnosis not present

## 2023-03-02 ENCOUNTER — Encounter: Payer: Self-pay | Admitting: Orthopedic Surgery

## 2023-03-02 NOTE — Progress Notes (Signed)
 Office Visit Note   Patient: Isaiah Gibson           Date of Birth: 1985/06/24           MRN: [id number] Visit Date: 02/28/2023 Requested by: [name], MD [address] [place],  [place] [address] PCP: [name], MD  Subjective: Chief Complaint  Patient presents with   Left Shoulder - Follow-up     LEFT SHOULDER ORIF (surgery date 10-08-22)       HPI: Isaiah Gibson is a 38 y.o. male who presents to the office reporting left shoulder pain.  Underwent left shoulder open reduction internal fixation 10/08/2022.  Decision today was for or against manipulation with rotator interval release.  Overall his shoulder is getting looser.  He played some golf and felt a pop and it actually felt better after that.  He is able to reach behind himself better.  Did report some anterior pain this weekend after pushing up a dresser up the stairs..                ROS: All systems reviewed are negative as they relate to the chief complaint within the history of present illness.  Patient denies fevers or chills.  Assessment & Plan: Visit Diagnoses:  1. Closed displaced fracture of greater tuberosity of left humerus with routine healing, subsequent encounter     Plan: Impression is left shoulder pain with no functional deficits at this time outside of diminished motion.  His motion is better compared to last visit.  I think we can release him at this time to home exercise program.  No indication for manipulation.  He will follow-up as needed.  Follow-Up Instructions: No follow-ups on file.   Orders:  No orders of the defined types were placed in this encounter.  No orders of the defined types were placed in this encounter.     Procedures: No procedures performed   Clinical Data: No additional findings.  Objective: Vital Signs: There were no vitals taken for this visit.  Physical Exam:  Constitutional: Patient appears well-developed HEENT:  Head: Normocephalic Eyes:EOM are  normal Neck: Normal range of motion Cardiovascular: Normal rate Pulmonary/chest: Effort normal Neurologic: Patient is alert Skin: Skin is warm Psychiatric: Patient has normal mood and affect  Ortho Exam: Ortho exam demonstrates range of motion of 30/105/165 on the left.  Rotator cuff strength intact infraspinatus supraspinatus and subscap muscle testing.  No coarse grinding or crepitus is present.  Motor or sensory function of the hand is intact.  Incision is intact.  Specialty Comments:  No specialty comments available.  Imaging: No results found.   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   Hematospermia 09/11/2020   Fatigue 09/27/2019   Erectile dysfunction 09/27/2019   Upper respiratory tract infection 09/27/2019   Screening-pulmonary TB 04/23/2017   Anxiety 04/14/2016   Panic attacks 04/14/2016   Weight gain 11/26/2015   Former smoker 11/26/2015   Pectoralis muscle strain 04/22/2015   Tendinitis of forearm 04/22/2015   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 04/16/2012   Dizziness 01/25/2011   Urinary frequency 01/25/2011   Bilateral leg edema 01/25/2011   Encounter for well adult exam with abnormal findings 01/02/2011   HEMATOCHEZIA 12/18/2009   WRIST PAIN, RIGHT 12/09/2009  ANKLE PAIN, RIGHT 12/09/2009   POLYCYTHEMIA 05/20/2009   RASH-NONVESICULAR 09/04/2008   ANKLE SPRAIN, LEFT 05/31/2008   Palpitations 01/25/2008   VENEREAL WART 05/15/2007   SMOKER 05/15/2007   BACK PAIN 05/15/2007   INSOMNIA-SLEEP DISORDER-UNSPEC 05/15/2007   Attention deficit disorder 10/20/2006   GERD 10/20/2006   PSORIASIS 10/20/2006   Past Medical History:  Diagnosis Date   ADD 10/20/2006   BACK PAIN 05/15/2007   GERD 10/20/2006   HEMATOCHEZIA 12/18/2009    INSOMNIA-SLEEP DISORDER-UNSPEC 05/15/2007   Palpitations 01/25/2008   POLYCYTHEMIA 05/20/2009   PSORIASIS 10/20/2006   SMOKER 05/15/2007   VENEREAL WART 05/15/2007    Family History  Problem Relation Age of Onset   Hypertension Other        Grandfather    Past Surgical History:  Procedure Laterality Date   HAND SURGERY Right    ORIF SHOULDER FRACTURE Left 10/08/2022   Procedure: OPEN REDUCTION INTERNAL FIXATION (ORIF) GREATER TUBEROSITY FRACTURE;  Surgeon: Addie Cordella Hamilton, MD;  Location: [facility] OR;  Service: Orthopedics;  Laterality: Left;  HANDY BED   WISDOM TOOTH EXTRACTION     Social History   Occupational History   Not on file  Tobacco Use   Smoking status: Former    Current packs/day: 0.00    Types: Cigarettes    Quit date: 09/19/2014    Years since quitting: 8.4   Smokeless tobacco: Never  Vaping Use   Vaping status: Never Used  Substance and Sexual Activity   Alcohol use: Yes    Alcohol/week: 7.0 standard drinks of alcohol    Types: 7 Shots of liquor per week    Comment: daily ETOH hard liquor   Drug use: Yes    Types: Marijuana    Comment: smoke and gummies   Sexual activity: Not on file

## 2023-03-20 ENCOUNTER — Telehealth: Payer: BC Managed Care – PPO | Admitting: Family Medicine

## 2023-03-20 DIAGNOSIS — R21 Rash and other nonspecific skin eruption: Secondary | ICD-10-CM

## 2023-03-20 MED ORDER — CLOTRIMAZOLE-BETAMETHASONE 1-0.05 % EX CREA: 1.0000 | TOPICAL_CREAM | Freq: Two times a day (BID) | CUTANEOUS | 0 refills | Status: AC

## 2023-03-20 NOTE — Patient Instructions (Signed)
Jock Itch Jock itch (tinea cruris) is an infection of the skin in the groin area that is caused by a fungus. Jock itch causes an itchy rash in the groin and upper thigh area. It usually goes away in 2-3 weeks with treatment. What are the causes? The fungus that causes jock itch may be spread by: Touching a fungal infection elsewhere on your body, such as athlete's foot, and then touching your groin area. Sharing towels or clothing, such as socks or shoes, with someone who has a fungal infection. What increases the risk? Jock itch is most common in men and adolescent boys. You are also more likely to develop the condition if you: Are in a hot, humid climate. Wear tight-fitting clothing or wet bathing suits for long periods of time. Play sports. Are overweight. Have diabetes. Have a weakened body defense system (immune system). Sweat a lot. What are the signs or symptoms? Symptoms of jock itch may include: A red, pink, or brown rash in the groin area. Blisters may be present. The rash may spread to the thighs, the opening between the buttocks (anus), and the buttocks. Dry and scaly skin on or around the rash. Itchiness. How is this diagnosed? In most cases, your health care provider can make the diagnosis by looking at your rash. In some cases, a sample of infected skin may be scraped off. This sample may be examined under a microscope (biopsy) or by trying to grow the fungus from the sample (culture). How is this treated? Treatment for this condition may include: Antifungal medicine to kill the fungus. This may be a skin cream, ointment, or powder, or it may be a medicine that you take by mouth (orally). Skin cream or ointment to reduce itching. Lifestyle changes, such as wearing looser clothing and caring for your skin. Follow these instructions at home: Skin care Apply skin creams, ointments, or powders exactly as told by your health care provider. Wear loose-fitting clothing that does  not rub against your groin area. Men should wear boxer shorts or loose-fitting underwear. Keep your groin area clean and dry. Change your underwear every day. Change out of wet bathing suits as soon as possible. After bathing, use a separate towel to gently dry your groin area thoroughly. Using a separate towel will help prevent spreading the infection to other parts of your body. Avoid hot baths and showers. Hot water can make itching worse. Do not scratch the affected area. General instructions Take and apply over-the-counter and prescription medicines only as told by your health care provider. Do not share towels, clothing, or personal items with other people. Wash your hands often with soap and water for at least 20 seconds, especially after touching your groin area. If soap and water are not available, use hand sanitizer. When at the gym: Always wear shoes, especially in the shower and around the swimming pool. Keep any cuts covered. Disinfect any mats or equipment before using them. Shower immediately after working out. Keep all follow-up visits. This is important. Contact a health care provider if: Your rash: Gets worse or does not get better after 2 weeks of treatment. Spreads. Returns after treatment is finished. You have any of the following: A fever. New or worsening redness, swelling, or pain around your rash. Fluid, blood, or pus coming from your rash. Summary Jock itch (tinea cruris) is a fungal infection of the skin in the groin area. The fungus can be spread by sharing clothing or by touching a fungus infection  elsewhere on your body and then touching your groin area. Treatment may include antifungal medicine and lifestyle changes, such as keeping the area clean and dry. This information is not intended to replace advice given to you by your health care provider. Make sure you discuss any questions you have with your health care provider. Document Revised: 04/29/2020  Document Reviewed: 04/29/2020 Elsevier Patient Education  2024 [employer].

## 2023-03-20 NOTE — Progress Notes (Signed)
Virtual Visit Consent   Isaiah Gibson, you are scheduled for a virtual visit with a [facility] provider today. Just as with appointments in the office, your consent must be obtained to participate. Your consent will be active for this visit and any virtual visit you may have with one of our providers in the next 365 days. If you have a MyChart account, a copy of this consent can be sent to you electronically.  As this is a virtual visit, video technology does not allow for your provider to perform a traditional examination. This may limit your provider's ability to fully assess your condition. If your provider identifies any concerns that need to be evaluated in person or the need to arrange testing (such as labs, EKG, etc.), we will make arrangements to do so. Although advances in technology are sophisticated, we cannot ensure that it will always work on either your end or our end. If the connection with a video visit is poor, the visit may have to be switched to a telephone visit. With either a video or telephone visit, we are not always able to ensure that we have a secure connection.  By engaging in this virtual visit, you consent to the provision of healthcare and authorize for your insurance to be billed (if applicable) for the services provided during this visit. Depending on your insurance coverage, you may receive a charge related to this service.  I need to obtain your verbal consent now. Are you willing to proceed with your visit today? AXIL COPEMAN has provided verbal consent on 03/20/2023 for a virtual visit (video or telephone). Georgana Curio, FNP  Date: 03/20/2023 12:33 PM  Virtual Visit via Video Note   I, Georgana Curio, connected with  KIANDRE SPAGNOLO  ([id number], 1985-06-02) on 03/20/23 at 12:30 PM EST by a video-enabled telemedicine application and verified that I am speaking with the correct person using two identifiers.  Location: Patient: Virtual Visit Location Patient: Home Provider:  Virtual Visit Location Provider: Home Office   I discussed the limitations of evaluation and management by telemedicine and the availability of in person appointments. The patient expressed understanding and agreed to proceed.    History of Present Illness: Isaiah Gibson is a 38 y.o. who identifies as a male who was assigned male at birth, and is being seen today for rash in groin that feels raw. [name]  HPI: HPI  Problems:  Patient Active Problem List   Diagnosis Date Noted   Closed fracture dislocation of shoulder with routine healing 10/09/2022   Obesity (BMI 35.0-39.9 without comorbidity) 07/22/2022   Vitamin D deficiency 07/22/2022   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   Hematospermia 09/11/2020   Fatigue 09/27/2019   Erectile dysfunction 09/27/2019   Upper respiratory tract infection 09/27/2019   Screening-pulmonary TB 04/23/2017   Anxiety 04/14/2016   Panic attacks 04/14/2016   Weight gain 11/26/2015   Former smoker 11/26/2015   Pectoralis muscle strain 04/22/2015   Tendinitis of forearm 04/22/2015   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 04/16/2012   Dizziness 01/25/2011   Urinary frequency 01/25/2011   Bilateral leg edema 01/25/2011   Encounter for well adult exam with abnormal findings 01/02/2011   HEMATOCHEZIA 12/18/2009   WRIST PAIN, RIGHT 12/09/2009   ANKLE PAIN, RIGHT 12/09/2009   POLYCYTHEMIA 05/20/2009   RASH-NONVESICULAR 09/04/2008   ANKLE SPRAIN, LEFT 05/31/2008   Palpitations 01/25/2008   VENEREAL WART 05/15/2007   SMOKER 05/15/2007   BACK PAIN 05/15/2007   INSOMNIA-SLEEP  DISORDER-UNSPEC 05/15/2007   Attention deficit disorder 10/20/2006   GERD 10/20/2006   PSORIASIS 10/20/2006    Allergies: No Known Allergies Medications:  Current Outpatient Medications:    clotrimazole-betamethasone (LOTRISONE) cream, Apply 1 Application topically 2 (two) times daily., Disp: 60 g, Rfl: 0   amphetamine-dextroamphetamine (ADDERALL) 30 MG tablet, Take 1 tablet by mouth 2  (two) times daily., Disp: 60 tablet, Rfl: 0   Aspirin-Acetaminophen-Caffeine (GOODY HEADACHE PO), Take 1 packet by mouth daily as needed (pain)., Disp: , Rfl:    celecoxib (CELEBREX) 100 MG capsule, Take 1 capsule (100 mg total) by mouth 2 (two) times daily., Disp: 60 capsule, Rfl: 0   Chorionic Gonadotropin (HCG) 6000 units SOLR, Inject as directed., Disp: , Rfl:    cyclobenzaprine (FLEXERIL) 5 MG tablet, Take 1 tablet (5 mg total) by mouth 3 (three) times daily as needed for muscle spasms., Disp: 90 tablet, Rfl: 1   cyclobenzaprine (FLEXERIL) 5 MG tablet, Take 1 tablet (5 mg total) by mouth 3 (three) times daily as needed., Disp: 40 tablet, Rfl: 1   oxyCODONE (ROXICODONE) 5 MG immediate release tablet, Take 1 tablet (5 mg total) by mouth every 12 (twelve) hours as needed for severe pain., Disp: 30 tablet, Rfl: 0   Risankizumab-rzaa,150 MG Dose, (SKYRIZI, 150 MG DOSE,) 75 MG/0.83ML PSKT, Inject 1 each into the skin every 3 (three) months., Disp: , Rfl:    Semaglutide-Weight Management (WEGOVY) 0.25 MG/0.5ML SOAJ, INJECT 0.25 MG INTO THE SKIN ONCE A WEEK FOR 28 DAYS, Disp: 2 mL, Rfl: 3   testosterone cypionate (DEPOTESTOSTERONE CYPIONATE) 200 MG/ML injection, Inject 1 mL (200 mg total) into the muscle every 14 (fourteen) days. (Patient taking differently: Inject 200 mg into the muscle once a week.), Disp: 10 mL, Rfl: 1  Observations/Objective: Patient is well-developed, well-nourished in no acute distress.  Resting comfortably  at home.  Head is normocephalic, atraumatic.  No labored breathing.  Speech is clear and coherent with logical content.  Patient is alert and oriented at baseline.    Assessment and Plan: 1. Rash (Primary)  Keep groin clean and dry, UC if sx worsen.   Follow Up Instructions: I discussed the assessment and treatment plan with the patient. The patient was provided an opportunity to ask questions and all were answered. The patient agreed with the plan and demonstrated  an understanding of the instructions.  A copy of instructions were sent to the patient via MyChart unless otherwise noted below.     The patient was advised to call back or seek an in-person evaluation if the symptoms worsen or if the condition fails to improve as anticipated.    Georgana Curio, FNP

## 2023-03-24 ENCOUNTER — Other Ambulatory Visit: Payer: Self-pay | Admitting: Internal Medicine

## 2023-03-24 MED ORDER — AMPHETAMINE-DEXTROAMPHETAMINE 30 MG PO TABS: 30.0000 mg | ORAL_TABLET | Freq: Two times a day (BID) | ORAL | 0 refills | Status: DC

## 2023-05-02 ENCOUNTER — Other Ambulatory Visit: Payer: Self-pay | Admitting: Internal Medicine

## 2023-05-02 MED ORDER — AMPHETAMINE-DEXTROAMPHETAMINE 30 MG PO TABS: 30.0000 mg | ORAL_TABLET | Freq: Two times a day (BID) | ORAL | 0 refills | Status: DC

## 2023-06-08 ENCOUNTER — Encounter: Payer: Self-pay | Admitting: Internal Medicine

## 2023-06-08 ENCOUNTER — Ambulatory Visit: Admitting: Internal Medicine

## 2023-06-08 ENCOUNTER — Other Ambulatory Visit: Payer: Self-pay | Admitting: Internal Medicine

## 2023-06-08 VITALS — BP 128/80 | HR 70 | Temp 98.0°F | Ht 72.0 in | Wt 293.0 lb

## 2023-06-08 DIAGNOSIS — Z0000 Encounter for general adult medical examination without abnormal findings: Secondary | ICD-10-CM

## 2023-06-08 DIAGNOSIS — E785 Hyperlipidemia, unspecified: Secondary | ICD-10-CM

## 2023-06-08 DIAGNOSIS — Z0001 Encounter for general adult medical examination with abnormal findings: Secondary | ICD-10-CM

## 2023-06-08 DIAGNOSIS — L408 Other psoriasis: Secondary | ICD-10-CM

## 2023-06-08 DIAGNOSIS — E559 Vitamin D deficiency, unspecified: Secondary | ICD-10-CM

## 2023-06-08 DIAGNOSIS — R739 Hyperglycemia, unspecified: Secondary | ICD-10-CM | POA: Diagnosis not present

## 2023-06-08 DIAGNOSIS — F419 Anxiety disorder, unspecified: Secondary | ICD-10-CM

## 2023-06-08 DIAGNOSIS — Z125 Encounter for screening for malignant neoplasm of prostate: Secondary | ICD-10-CM | POA: Diagnosis not present

## 2023-06-08 DIAGNOSIS — E538 Deficiency of other specified B group vitamins: Secondary | ICD-10-CM | POA: Diagnosis not present

## 2023-06-08 DIAGNOSIS — F909 Attention-deficit hyperactivity disorder, unspecified type: Secondary | ICD-10-CM

## 2023-06-08 DIAGNOSIS — E669 Obesity, unspecified: Secondary | ICD-10-CM

## 2023-06-08 DIAGNOSIS — E291 Testicular hypofunction: Secondary | ICD-10-CM

## 2023-06-08 DIAGNOSIS — N529 Male erectile dysfunction, unspecified: Secondary | ICD-10-CM

## 2023-06-08 LAB — CBC WITH DIFFERENTIAL/PLATELET
Basophils Absolute: 0.1 10*3/uL (ref 0.0–0.1)
Basophils Relative: 1.2 % (ref 0.0–3.0)
Eosinophils Absolute: 0.2 10*3/uL (ref 0.0–0.7)
Eosinophils Relative: 2.3 % (ref 0.0–5.0)
HCT: 47.6 % (ref 39.0–52.0)
Hemoglobin: 16.5 g/dL (ref 13.0–17.0)
Lymphocytes Relative: 23.7 % (ref 12.0–46.0)
Lymphs Abs: 1.5 10*3/uL (ref 0.7–4.0)
MCHC: 34.7 g/dL (ref 30.0–36.0)
MCV: 89.4 fl (ref 78.0–100.0)
Monocytes Absolute: 0.8 10*3/uL (ref 0.1–1.0)
Monocytes Relative: 12.1 % — ABNORMAL HIGH (ref 3.0–12.0)
Neutro Abs: 3.9 10*3/uL (ref 1.4–7.7)
Neutrophils Relative %: 60.7 % (ref 43.0–77.0)
Platelets: 266 10*3/uL (ref 150.0–400.0)
RBC: 5.32 Mil/uL (ref 4.22–5.81)
RDW: 13.1 % (ref 11.5–15.5)
WBC: 6.5 10*3/uL (ref 4.0–10.5)

## 2023-06-08 LAB — URINALYSIS, ROUTINE W REFLEX MICROSCOPIC
Bilirubin Urine: NEGATIVE
Hgb urine dipstick: NEGATIVE
Ketones, ur: NEGATIVE
Leukocytes,Ua: NEGATIVE
Nitrite: NEGATIVE
Specific Gravity, Urine: 1.02 (ref 1.000–1.030)
Total Protein, Urine: NEGATIVE
Urine Glucose: NEGATIVE
Urobilinogen, UA: 2 — AB (ref 0.0–1.0)
pH: 7 (ref 5.0–8.0)

## 2023-06-08 LAB — LIPID PANEL
Cholesterol: 142 mg/dL (ref 0–200)
HDL: 32.7 mg/dL — ABNORMAL LOW (ref >39.00)
LDL Cholesterol: 65 mg/dL (ref 0–99)
NonHDL: 109.57
Total CHOL/HDL Ratio: 4
Triglycerides: 223 mg/dL — ABNORMAL HIGH (ref 0.0–149.0)
VLDL: 44.6 mg/dL — ABNORMAL HIGH (ref 0.0–40.0)

## 2023-06-08 LAB — HEMOGLOBIN A1C: Hgb A1c MFr Bld: 5.1 % (ref 4.6–6.5)

## 2023-06-08 LAB — VITAMIN B12: Vitamin B-12: 390 pg/mL (ref 211–911)

## 2023-06-08 LAB — BASIC METABOLIC PANEL WITH GFR
BUN: 15 mg/dL (ref 6–23)
CO2: 26 meq/L (ref 19–32)
Calcium: 9.2 mg/dL (ref 8.4–10.5)
Chloride: 100 meq/L (ref 96–112)
Creatinine, Ser: 0.89 mg/dL (ref 0.40–1.50)
GFR: 107.47 mL/min (ref >60.00)
Glucose, Bld: 92 mg/dL (ref 70–99)
Potassium: 3.8 meq/L (ref 3.5–5.1)
Sodium: 135 meq/L (ref 135–145)

## 2023-06-08 LAB — VITAMIN D 25 HYDROXY (VIT D DEFICIENCY, FRACTURES): VITD: 40.53 ng/mL (ref 30.00–100.00)

## 2023-06-08 LAB — FOLLICLE STIMULATING HORMONE: FSH: 0.2 m[IU]/mL — ABNORMAL LOW (ref 1.4–18.1)

## 2023-06-08 LAB — HEPATIC FUNCTION PANEL
ALT: 44 U/L (ref 0–53)
AST: 32 U/L (ref 0–37)
Albumin: 4.4 g/dL (ref 3.5–5.2)
Alkaline Phosphatase: 88 U/L (ref 39–117)
Bilirubin, Direct: 0.2 mg/dL (ref 0.0–0.3)
Total Bilirubin: 1 mg/dL (ref 0.2–1.2)
Total Protein: 6.6 g/dL (ref 6.0–8.3)

## 2023-06-08 LAB — TESTOSTERONE: Testosterone: 287.5 ng/dL — ABNORMAL LOW (ref 300.00–890.00)

## 2023-06-08 LAB — LUTEINIZING HORMONE: LH: 0.2 m[IU]/mL — ABNORMAL LOW (ref 1.50–9.30)

## 2023-06-08 LAB — TSH: TSH: 2.69 u[IU]/mL (ref 0.35–5.50)

## 2023-06-08 LAB — PSA: PSA: 1.44 ng/mL (ref 0.10–4.00)

## 2023-06-08 MED ORDER — TADALAFIL 5 MG PO TABS: 5.0000 mg | ORAL_TABLET | Freq: Every day | ORAL | 3 refills | Status: AC

## 2023-06-08 MED ORDER — TESTOSTERONE CYPIONATE 200 MG/ML IM SOLN: 200.0000 mg | INTRAMUSCULAR | 1 refills | Status: DC

## 2023-06-08 MED ORDER — AMPHETAMINE-DEXTROAMPHETAMINE 30 MG PO TABS: 30.0000 mg | ORAL_TABLET | Freq: Two times a day (BID) | ORAL | 0 refills | Status: DC

## 2023-06-08 NOTE — Assessment & Plan Note (Signed)
Overall stable, cont current med tx 

## 2023-06-08 NOTE — Assessment & Plan Note (Addendum)
 Recently failed 2 biosimilars , may need a third agent trial per dermatology, pt to continue clobetasol cr asd for now

## 2023-06-08 NOTE — Assessment & Plan Note (Signed)
 Age and sex appropriate education and counseling updated with regular exercise and diet Referrals for preventative services - none needed Immunizations addressed - for tdap at pharmacy Smoking counseling  - none needed Evidence for depression or other mood disorder - stable anxiety Most recent labs reviewed. I have personally reviewed and have noted: 1) the patient's medical and social history 2) The patient's current medications and supplements 3) The patient's height, weight, and BMI have been recorded in the chart

## 2023-06-08 NOTE — Patient Instructions (Signed)

## 2023-06-08 NOTE — Assessment & Plan Note (Signed)
 Ok for add cilais 5 mg qd
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
## 2023-06-08 NOTE — Assessment & Plan Note (Signed)
 Pt declines phentermine 30 mg, now for increased activity and reduced calories

## 2023-06-08 NOTE — Assessment & Plan Note (Signed)
 Stable, continue adderal

## 2023-06-08 NOTE — Progress Notes (Signed)
 The test results show that your current treatment is OK, as the tests are stable.  Please continue the same plan.  There is no other need for change of treatment or further evaluation based on these results, at this time.  thanks

## 2023-06-08 NOTE — Assessment & Plan Note (Signed)
 Last vitamin D Lab Results  Component Value Date   VD25OH 25.93 (L) 07/21/2022   Low, to start ral replacement

## 2023-06-08 NOTE — Assessment & Plan Note (Signed)
Stable, for f/u lab 

## 2023-07-07 ENCOUNTER — Other Ambulatory Visit: Payer: Self-pay | Admitting: Internal Medicine

## 2023-07-07 MED ORDER — AMPHETAMINE-DEXTROAMPHETAMINE 30 MG PO TABS: 30.0000 mg | ORAL_TABLET | Freq: Two times a day (BID) | ORAL | 0 refills | Status: DC

## 2023-08-04 ENCOUNTER — Other Ambulatory Visit: Payer: Self-pay | Admitting: Internal Medicine

## 2023-08-04 NOTE — Telephone Encounter (Unsigned)
 Copied from CRM [id number]. Topic: Clinical - Medication Refill >> Aug 04, 2023  4:36 PM Magdalene School wrote: Medication: amphetamine -dextroamphetamine  (ADDERALL) 30 MG tablet  Has the patient contacted their pharmacy? No (Agent: If no, request that the patient contact the pharmacy for the refill. If patient does not wish to contact the pharmacy document the reason why and proceed with request.) (Agent: If yes, when and what did the pharmacy advise?)  This is the patient's preferred pharmacy:  [facility] [address], [place] - [address] [place] [address] Phone: [phone] Fax: [phone]  Is this the correct pharmacy for this prescription? Yes If no, delete pharmacy and type the correct one.   Has the prescription been filled recently? No  Is the patient out of the medication? No  Has the patient been seen for an appointment in the last year OR does the patient have an upcoming appointment? Yes  Can we respond through MyChart? Yes  Agent: Please be advised that Rx refills may take up to 3 business days. We ask that you follow-up with your pharmacy.

## 2023-08-08 ENCOUNTER — Ambulatory Visit: Payer: Self-pay | Admitting: Internal Medicine

## 2023-08-08 ENCOUNTER — Ambulatory Visit: Admitting: Internal Medicine

## 2023-08-08 ENCOUNTER — Encounter: Payer: Self-pay | Admitting: Internal Medicine

## 2023-08-08 VITALS — BP 120/100 | HR 82 | Temp 98.2°F | Ht 72.0 in | Wt 293.0 lb

## 2023-08-08 DIAGNOSIS — Z125 Encounter for screening for malignant neoplasm of prostate: Secondary | ICD-10-CM

## 2023-08-08 DIAGNOSIS — F909 Attention-deficit hyperactivity disorder, unspecified type: Secondary | ICD-10-CM | POA: Diagnosis not present

## 2023-08-08 DIAGNOSIS — I10 Essential (primary) hypertension: Secondary | ICD-10-CM | POA: Insufficient documentation

## 2023-08-08 DIAGNOSIS — E291 Testicular hypofunction: Secondary | ICD-10-CM | POA: Diagnosis not present

## 2023-08-08 DIAGNOSIS — R600 Localized edema: Secondary | ICD-10-CM | POA: Diagnosis not present

## 2023-08-08 LAB — CBC WITH DIFFERENTIAL/PLATELET
Basophils Absolute: 0.1 10*3/uL (ref 0.0–0.1)
Basophils Relative: 1.1 % (ref 0.0–3.0)
Eosinophils Absolute: 0.2 10*3/uL (ref 0.0–0.7)
Eosinophils Relative: 3 % (ref 0.0–5.0)
HCT: 47.5 % (ref 39.0–52.0)
Hemoglobin: 16.4 g/dL (ref 13.0–17.0)
Lymphocytes Relative: 35.6 % (ref 12.0–46.0)
Lymphs Abs: 2.9 10*3/uL (ref 0.7–4.0)
MCHC: 34.4 g/dL (ref 30.0–36.0)
MCV: 87 fl (ref 78.0–100.0)
Monocytes Absolute: 0.7 10*3/uL (ref 0.1–1.0)
Monocytes Relative: 8.7 % (ref 3.0–12.0)
Neutro Abs: 4.2 10*3/uL (ref 1.4–7.7)
Neutrophils Relative %: 51.6 % (ref 43.0–77.0)
Platelets: 291 10*3/uL (ref 150.0–400.0)
RBC: 5.47 Mil/uL (ref 4.22–5.81)
RDW: 14.1 % (ref 11.5–15.5)
WBC: 8.1 10*3/uL (ref 4.0–10.5)

## 2023-08-08 LAB — BASIC METABOLIC PANEL WITH GFR
BUN: 11 mg/dL (ref 6–23)
CO2: 27 meq/L (ref 19–32)
Calcium: 9.3 mg/dL (ref 8.4–10.5)
Chloride: 102 meq/L (ref 96–112)
Creatinine, Ser: 0.73 mg/dL (ref 0.40–1.50)
GFR: 113.97 mL/min (ref >60.00)
Glucose, Bld: 85 mg/dL (ref 70–99)
Potassium: 3.8 meq/L (ref 3.5–5.1)
Sodium: 136 meq/L (ref 135–145)

## 2023-08-08 LAB — HEPATIC FUNCTION PANEL
ALT: 27 U/L (ref 0–53)
AST: 25 U/L (ref 0–37)
Albumin: 4.4 g/dL (ref 3.5–5.2)
Alkaline Phosphatase: 68 U/L (ref 39–117)
Bilirubin, Direct: 0.2 mg/dL (ref 0.0–0.3)
Total Bilirubin: 0.8 mg/dL (ref 0.2–1.2)
Total Protein: 7.1 g/dL (ref 6.0–8.3)

## 2023-08-08 LAB — ESTRADIOL: Estradiol: 83 pg/mL — ABNORMAL HIGH (ref <=39)

## 2023-08-08 LAB — PSA: PSA: 0.95 ng/mL (ref 0.10–4.00)

## 2023-08-08 MED ORDER — AMPHETAMINE-DEXTROAMPHETAMINE 30 MG PO TABS: 30.0000 mg | ORAL_TABLET | Freq: Two times a day (BID) | ORAL | 0 refills | Status: DC

## 2023-08-08 MED ORDER — HYDROCHLOROTHIAZIDE 12.5 MG PO CAPS: 12.5000 mg | ORAL_CAPSULE | Freq: Every day | ORAL | 3 refills | Status: AC

## 2023-08-08 NOTE — Assessment & Plan Note (Signed)
 BP Readings from Last 3 Encounters:  08/08/23 (!) 120/100  06/08/23 128/80  10/08/22 (!) 146/93   Uncontrolled, pt to start hct 12.5 qd

## 2023-08-08 NOTE — Assessment & Plan Note (Signed)
Likely venous insuffiency, for wt control, low salt, no forced fluids daily, leg elevation, compression stockings, hct 12.5 prn and f/u any worsening s/s

## 2023-08-08 NOTE — Assessment & Plan Note (Signed)
 Also for psa, testorone, estradiol levels

## 2023-08-08 NOTE — Patient Instructions (Addendum)
 Please take all new medication as prescribed - the mild fluid pill HCT 12.5 mg per day  Please continue all other medications as before, and refills have been done for the Adderall  Please have the pharmacy call with any other refills you may need.  Please continue your efforts at being more active, low cholesterol diet, and weight control..  Please keep your appointments with your specialists as you may have planned  Please go to the LAB at the blood drawing area for the tests to be done  You will be contacted by phone if any changes need to be made immediately.  Otherwise, you will receive a letter about your results with an explanation, but please check with MyChart first.  Please make an Appointment to return in 6 months, or sooner if needed

## 2023-08-08 NOTE — Assessment & Plan Note (Signed)
 Stable, continue Adderall asd

## 2023-08-08 NOTE — Progress Notes (Signed)
 Patient ID: Isaiah Gibson, male   DOB: 1985/09/02, 38 y.o.   MRN: [id number]        Chief Complaint: follow up HTN, leg swelling, low testosterone , ADD       HPI:  Isaiah Gibson is a 38 y.o. male here with c/o worsening bilateral leg swelling in the setting of severe obesity but no wt gain apparently.   Asks for testosterone  level with psa, hormone levels with labs.  ADD med working well, requests refill.   Pt denies polydipsia, polyuria, or new focal neuro s/s.    Pt denies fever, wt loss, night sweats, loss of appetite, or other constitutional symptoms  BP has been mild increased recently.        Wt Readings from Last 3 Encounters:  08/08/23 293 lb (132.9 kg)  06/08/23 293 lb (132.9 kg)  10/08/22 279 lb 15.8 oz (127 kg)   BP Readings from Last 3 Encounters:  08/08/23 (!) 120/100  06/08/23 128/80  10/08/22 (!) 146/93         Past Medical History:  Diagnosis Date   ADD 10/20/2006   BACK PAIN 05/15/2007   GERD 10/20/2006   HEMATOCHEZIA 12/18/2009   INSOMNIA-SLEEP DISORDER-UNSPEC 05/15/2007   Palpitations 01/25/2008   POLYCYTHEMIA 05/20/2009   PSORIASIS 10/20/2006   SMOKER 05/15/2007   VENEREAL WART 05/15/2007   Past Surgical History:  Procedure Laterality Date   HAND SURGERY Right    ORIF SHOULDER FRACTURE Left 10/08/2022   Procedure: OPEN REDUCTION INTERNAL FIXATION (ORIF) GREATER TUBEROSITY FRACTURE;  Surgeon: Jasmine Mesi, MD;  Location: [facility] OR;  Service: Orthopedics;  Laterality: Left;  HANDY BED   WISDOM TOOTH EXTRACTION      reports that he quit smoking about 8 years ago. His smoking use included cigarettes. He has never used smokeless tobacco. He reports current alcohol use of about 7.0 standard drinks of alcohol per week. He reports current drug use. Drug: Marijuana. family history includes Hypertension in an other family member. No Known Allergies Current Outpatient Medications on File Prior to Visit  Medication Sig Dispense Refill   Aspirin-Acetaminophen -Caffeine  (GOODY HEADACHE PO) Take 1 packet by mouth daily as needed (pain).     celecoxib  (CELEBREX ) 100 MG capsule Take 1 capsule (100 mg total) by mouth 2 (two) times daily. 60 capsule 0   Chorionic Gonadotropin  (HCG) 6000 units SOLR Inject as directed.     cyclobenzaprine  (FLEXERIL ) 5 MG tablet Take 1 tablet (5 mg total) by mouth 3 (three) times daily as needed. 40 tablet 1   tadalafil  (CIALIS ) 5 MG tablet Take 1 tablet (5 mg total) by mouth daily. 90 tablet 3   testosterone  cypionate (DEPOTESTOSTERONE CYPIONATE) 200 MG/ML injection Inject 1 mL (200 mg total) into the muscle every 14 (fourteen) days. 10 mL 1   No current facility-administered medications on file prior to visit.        ROS:  All others reviewed and negative.  Objective        PE:  BP (!) 120/100 (BP Location: Left Arm, Patient Position: Sitting, Cuff Size: Normal)   Pulse 82   Temp 98.2 F (36.8 C) (Oral)   Ht 6' (1.829 m)   Wt 293 lb (132.9 kg)   SpO2 99%   BMI 39.74 kg/m                 Constitutional: Pt appears in NAD               HENT: Head: NCAT.  Right Ear: External ear normal.                 Left Ear: External ear normal.                Eyes: . Pupils are equal, round, and reactive to light. Conjunctivae and EOM are normal               Nose: without d/c or deformity               Neck: Neck supple. Gross normal ROM               Cardiovascular: Normal rate and regular rhythm.                 Pulmonary/Chest: Effort normal and breath sounds without rales or wheezing.                               Neurological: Pt is alert. At baseline orientation, motor grossly intact               Skin: Skin is warm. No rashes, no other new lesions, LE edema - 1+ bilateral to knees               Psychiatric: Pt behavior is normal without agitation   Micro: none  Cardiac tracings I have personally interpreted today:  none  Pertinent Radiological findings (summarize): none   Lab Results  Component Value  Date   WBC 8.1 08/08/2023   HGB 16.4 08/08/2023   HCT 47.5 08/08/2023   PLT 291.0 08/08/2023   GLUCOSE 85 08/08/2023   CHOL 142 06/08/2023   TRIG 223.0 (H) 06/08/2023   HDL 32.70 (L) 06/08/2023   LDLCALC 65 06/08/2023   ALT 27 08/08/2023   AST 25 08/08/2023   NA 136 08/08/2023   K 3.8 08/08/2023   CL 102 08/08/2023   CREATININE 0.73 08/08/2023   BUN 11 08/08/2023   CO2 27 08/08/2023   TSH 2.69 06/08/2023   PSA 0.95 08/08/2023   HGBA1C 5.1 06/08/2023   Assessment/Plan:  Isaiah Gibson is a 38 y.o. Fitz or Caucasian [1] male with  has a past medical history of ADD (10/20/2006), BACK PAIN (05/15/2007), GERD (10/20/2006), HEMATOCHEZIA (12/18/2009), INSOMNIA-SLEEP DISORDER-UNSPEC (05/15/2007), Palpitations (01/25/2008), POLYCYTHEMIA (05/20/2009), PSORIASIS (10/20/2006), SMOKER (05/15/2007), and VENEREAL WART (05/15/2007).  Bilateral leg edema Likely venous insuffiency, for wt control, low salt, no forced fluids daily, leg elevation, compression stockings, hct 12.5 prn and f/u any worsening s/s  Male hypogonadism Also for psa, testorone, estradiol levels  Attention deficit disorder Stable, continue Adderall asd  HTN (hypertension) BP Readings from Last 3 Encounters:  08/08/23 (!) 120/100  06/08/23 128/80  10/08/22 (!) 146/93   Uncontrolled, pt to start hct 12.5 qd   Followup: Return in about 6 months (around 02/07/2024).  Rosalia Colonel, MD 08/08/2023 7:39 PM [facility] - [facility]

## 2023-08-09 ENCOUNTER — Encounter: Payer: Self-pay | Admitting: Internal Medicine

## 2023-08-10 LAB — TESTOSTERONE,FREE AND TOTAL
Testosterone, Free: 25.6 pg/mL — ABNORMAL HIGH (ref 6.8–21.5)
Testosterone: 896 ng/dL (ref 264–916)

## 2023-09-07 ENCOUNTER — Telehealth: Payer: Self-pay | Admitting: Internal Medicine

## 2023-09-07 NOTE — Telephone Encounter (Unsigned)
 Copied from CRM [id number]. Topic: Clinical - Medication Refill >> Sep 07, 2023 11:53 AM Chasity T wrote: Medication: amphetamine -dextroamphetamine  (ADDERALL) 30 MG tablet   Has the patient contacted their pharmacy? Yes  This is the patient's preferred pharmacy:  [facility]  - [place] , [place] - [address]  [address] [place], [place] [address] Phone: [phone]     Is this the correct pharmacy for this prescription? Yes If no, delete pharmacy and type the correct one.   Has the prescription been filled recently? No  Is the patient out of the medication? Yes  Has the patient been seen for an appointment in the last year OR does the patient have an upcoming appointment? Yes  Can we respond through MyChart? Yes  Agent: Please be advised that Rx refills may take up to 3 business days. We ask that you follow-up with your pharmacy.

## 2023-09-09 MED ORDER — AMPHETAMINE-DEXTROAMPHETAMINE 30 MG PO TABS: 30.0000 mg | ORAL_TABLET | Freq: Two times a day (BID) | ORAL | 0 refills | Status: DC

## 2023-09-09 NOTE — Telephone Encounter (Signed)
 Done erx

## 2023-10-06 ENCOUNTER — Encounter: Payer: Self-pay | Admitting: Internal Medicine

## 2023-10-06 ENCOUNTER — Telehealth (INDEPENDENT_AMBULATORY_CARE_PROVIDER_SITE_OTHER): Admitting: Internal Medicine

## 2023-10-06 DIAGNOSIS — R062 Wheezing: Secondary | ICD-10-CM | POA: Diagnosis not present

## 2023-10-06 DIAGNOSIS — E559 Vitamin D deficiency, unspecified: Secondary | ICD-10-CM

## 2023-10-06 DIAGNOSIS — R051 Acute cough: Secondary | ICD-10-CM

## 2023-10-06 DIAGNOSIS — R059 Cough, unspecified: Secondary | ICD-10-CM | POA: Insufficient documentation

## 2023-10-06 MED ORDER — AMPHETAMINE-DEXTROAMPHETAMINE 30 MG PO TABS: 30.0000 mg | ORAL_TABLET | Freq: Two times a day (BID) | ORAL | 0 refills | Status: DC

## 2023-10-06 MED ORDER — HYDROCODONE BIT-HOMATROP MBR 5-1.5 MG/5ML PO SOLN: 5.0000 mL | Freq: Four times a day (QID) | ORAL | 0 refills | Status: AC | PRN

## 2023-10-06 MED ORDER — PREDNISONE 10 MG PO TABS: ORAL_TABLET | ORAL | 0 refills | Status: AC

## 2023-10-06 MED ORDER — ALBUTEROL SULFATE HFA 108 (90 BASE) MCG/ACT IN AERS: 2.0000 | INHALATION_SPRAY | Freq: Four times a day (QID) | RESPIRATORY_TRACT | 1 refills | Status: AC | PRN

## 2023-10-06 MED ORDER — AZITHROMYCIN 250 MG PO TABS: ORAL_TABLET | ORAL | 1 refills | Status: AC

## 2023-10-06 NOTE — Patient Instructions (Signed)
 Please take all new medication as prescribed

## 2023-10-06 NOTE — Assessment & Plan Note (Signed)
 Last vitamin D  Lab Results  Component Value Date   VD25OH 40.53 06/08/2023   Stable, cont oral replacement

## 2023-10-06 NOTE — Progress Notes (Signed)
 Patient ID: Isaiah Gibson, male   DOB: November 09, 1985, 38 y.o.   MRN: [id number]  Virtual Visit via Video Note  I connected with Isaiah Gibson on 10/06/23 at  3:40 PM EDT by a video enabled telemedicine application and verified that I am speaking with the correct person using two identifiers.  Location of all participants today Patient: at home Provider: at office   I discussed the limitations of evaluation and management by telemedicine and the availability of in person appointments. The patient expressed understanding and agreed to proceed.  History of Present Illness: Here with acute onset mild to mod 2-3 days ST, HA, general weakness and malaise, with prod cough greenish sputum, but Pt denies chest pain, increased sob or doe, wheezing, orthopnea, PND, increased LE swelling, palpitations, dizziness or syncope, except for mild sob wheezing x 1 days.   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 His smoking use included cigarettes. He has never used smokeless tobacco. He reports current alcohol use of about 7.0 standard drinks of alcohol per week. He reports current drug use. Drug: Marijuana. family history includes Hypertension in an other family member. No Known Allergies Current Outpatient Medications on File Prior to Visit  Medication Sig Dispense Refill    Aspirin-Acetaminophen -Caffeine (GOODY HEADACHE PO) Take 1 packet by mouth daily as needed (pain).     celecoxib  (CELEBREX ) 100 MG capsule Take 1 capsule (100 mg total) by mouth 2 (two) times daily. 60 capsule 0   Chorionic Gonadotropin  (HCG) 6000 units SOLR Inject as directed.     cyclobenzaprine  (FLEXERIL ) 5 MG tablet Take 1 tablet (5 mg total) by mouth 3 (three) times daily as needed. 40 tablet 1   hydrochlorothiazide  (MICROZIDE ) 12.5 MG capsule Take 1 capsule (12.5 mg total) by mouth daily. 90 capsule 3   tadalafil  (CIALIS ) 5 MG tablet Take 1 tablet (5 mg total) by mouth daily. 90 tablet 3   testosterone  cypionate (DEPOTESTOSTERONE CYPIONATE) 200 MG/ML injection Inject 1 mL (200 mg total) into the muscle every 14 (fourteen) days. 10 mL 1   No current facility-administered medications on file prior to visit.    Observations/Objective: Alert, mild ill, appropriate mood and affect, resps normal, cn 2-12 intact, moves all 4s, no visible rash or swelling Lab Results  Component Value Date   WBC 8.1 08/08/2023   HGB 16.4 08/08/2023   HCT 47.5 08/08/2023   PLT 291.0 08/08/2023   GLUCOSE 85 08/08/2023   CHOL 142 06/08/2023   TRIG 223.0 (H) 06/08/2023   HDL 32.70 (L) 06/08/2023   LDLCALC 65 06/08/2023   ALT 27 08/08/2023   AST 25 08/08/2023   NA 136 08/08/2023   K 3.8 08/08/2023   CL 102 08/08/2023   CREATININE 0.73 08/08/2023   BUN 11 08/08/2023   CO2 27 08/08/2023   TSH 2.69 06/08/2023  PSA 0.95 08/08/2023   HGBA1C 5.1 06/08/2023   Assessment and Plan: See notes  Follow Up Instructions: See notes   I discussed the assessment and treatment plan with the patient. The patient was provided an opportunity to ask questions and all were answered. The patient agreed with the plan and demonstrated an understanding of the instructions.   The patient was advised to call back or seek an in-person evaluation if the symptoms worsen or if the condition fails to improve as  anticipated.   Lynwood Rush, MD

## 2023-10-06 NOTE — Assessment & Plan Note (Signed)
Mild to mod, for prednisone taper, albuterol hfa prn,  to f/u any worsening symptoms or concerns

## 2023-10-06 NOTE — Assessment & Plan Note (Signed)
 Mild to mod, c/w bronchitis vs pna, for antibx course zpack x 1, cough med prn,  to f/u any worsening symptoms or concerns

## 2023-11-07 ENCOUNTER — Other Ambulatory Visit: Payer: Self-pay | Admitting: Internal Medicine

## 2023-11-07 NOTE — Telephone Encounter (Unsigned)
 Copied from CRM [id number]. Topic: Clinical - Medication Refill >> Nov 07, 2023  5:50 PM Drema MATSU wrote: Medication: amphetamine -dextroamphetamine  (ADDERALL) 30 MG tablet  Has the patient contacted their pharmacy? No (Agent: If no, request that the patient contact the pharmacy for the refill. If patient does not wish to contact the pharmacy document the reason why and proceed with request.) can't fill call provider  (Agent: If yes, when and what did the pharmacy advise?)  This is the patient's preferred pharmacy:  [facility] [address] [place] - [address] [place] [address] Phone: [phone] Fax: [phone]    Is this the correct pharmacy for this prescription? Yes If no, delete pharmacy and type the correct one.   Has the prescription been filled recently? Yes  Is the patient out of the medication? No  Has the patient been seen for an appointment in the last year OR does the patient have an upcoming appointment? Yes  Can we respond through MyChart? Yes  Agent: Please be advised that Rx refills may take up to 3 business days. We ask that you follow-up with your pharmacy.

## 2023-11-08 MED ORDER — AMPHETAMINE-DEXTROAMPHETAMINE 30 MG PO TABS: 30.0000 mg | ORAL_TABLET | Freq: Two times a day (BID) | ORAL | 0 refills | Status: DC

## 2023-11-11 ENCOUNTER — Telehealth: Payer: Self-pay

## 2023-11-11 ENCOUNTER — Other Ambulatory Visit: Payer: Self-pay | Admitting: Internal Medicine

## 2023-11-11 MED ORDER — AMPHETAMINE-DEXTROAMPHETAMINE 30 MG PO TABS: 30.0000 mg | ORAL_TABLET | Freq: Two times a day (BID) | ORAL | 0 refills | Status: DC

## 2023-11-11 NOTE — Telephone Encounter (Signed)
 Copied from CRM [id number]. Topic: Clinical - Prescription Issue >> Nov 11, 2023  1:34 PM Isaiah Gibson wrote: Reason for CRM: [facility] is [facility] not [facility] mphetamine-dextroamphetamine  (ADDERALL) 30 MG tablet [[id number]] in stock and he would like to know if we can send it to [id number] - [place], [place] - [address]  Phone: [phone] Fax: [phone]

## 2023-11-11 NOTE — Addendum Note (Signed)
 Addended by: [name] on: 11/11/2023 02:43 PM   Modules accepted: Orders

## 2023-11-11 NOTE — Telephone Encounter (Signed)
 Unable, as it appears E2C2 provided the script refill request as pending, but EPIC will then me sign it (wrong provider)

## 2023-11-15 ENCOUNTER — Encounter: Payer: Self-pay | Admitting: Internal Medicine

## 2023-11-22 ENCOUNTER — Telehealth: Payer: Self-pay | Admitting: Radiology

## 2023-11-22 ENCOUNTER — Telehealth: Payer: Self-pay

## 2023-11-22 NOTE — Telephone Encounter (Signed)
 Copied from CRM [id number]. Topic: Clinical - Prescription Issue >> Nov 22, 2023  9:19 AM Franky GRADE wrote: Reason for CRM: Franky from [facility] is calling because they do not have the manufacturer amphetamine -dextroamphetamine  (ADDERALL) 30 MG tablet [[id number]] and are requesting we send prescription to a different pharmacy.

## 2023-11-22 NOTE — Telephone Encounter (Signed)
 Ok to ask the patient if he has a different pharmacy he wants to change the adderall rx

## 2023-11-22 NOTE — Telephone Encounter (Signed)
 Copied from CRM #[id number]. Topic: Clinical - Prescription Issue >> Nov 22, 2023  4:41 PM Alfonso ORN wrote: Reason for CRM: pt called back to state he would like rx moved to [facility]  [address], [place], [place] [address] Phone [phone]

## 2023-11-22 NOTE — Telephone Encounter (Signed)
 Pt called back to state he would like prescription moved to [facility]  [address], [place], [place] [address] Phone [phone]

## 2023-11-23 MED ORDER — AMPHETAMINE-DEXTROAMPHETAMINE 30 MG PO TABS: 30.0000 mg | ORAL_TABLET | Freq: Two times a day (BID) | ORAL | 0 refills | Status: DC

## 2023-11-23 NOTE — Telephone Encounter (Signed)
 Ok this is done, thanks

## 2023-11-23 NOTE — Addendum Note (Signed)
 Addended by: [name] on: 11/23/2023 12:01 PM   Modules accepted: Orders

## 2023-11-24 NOTE — Telephone Encounter (Signed)
 This has been done in previous phone encounter.

## 2023-12-16 ENCOUNTER — Other Ambulatory Visit: Payer: Self-pay | Admitting: Internal Medicine

## 2023-12-16 MED ORDER — AMPHETAMINE-DEXTROAMPHETAMINE 30 MG PO TABS: 30.0000 mg | ORAL_TABLET | Freq: Two times a day (BID) | ORAL | 0 refills | Status: DC

## 2023-12-16 NOTE — Telephone Encounter (Signed)
 Copied from CRM #[id number]. Topic: Clinical - Medication Refill >> Dec 16, 2023 11:43 AM Thersia BROCKS wrote: Medication: amphetamine -dextroamphetamine  (ADDERALL) 30 MG tablet   Has the patient contacted their pharmacy? Yes (Agent: If no, request that the patient contact the pharmacy for the refill. If patient does not wish to contact the pharmacy document the reason why and proceed with request.) (Agent: If yes, when and what did the pharmacy advise?)  This is the patient's preferred pharmacy:   [id number] [place], [place] - [address] [place] [address] Phone: [phone] Fax: [phone]  Is this the correct pharmacy for this prescription? Yes If no, delete pharmacy and type the correct one.   Has the prescription been filled recently? No  Is the patient out of the medication? Yes  Has the patient been seen for an appointment in the last year OR does the patient have an upcoming appointment? Yes  Can we respond through MyChart? Yes  Agent: Please be advised that Rx refills may take up to 3 business days. We ask that you follow-up with your pharmacy.

## 2023-12-26 ENCOUNTER — Encounter: Payer: Self-pay | Admitting: Radiology

## 2024-01-02 ENCOUNTER — Other Ambulatory Visit: Payer: Self-pay | Admitting: Internal Medicine

## 2024-01-17 ENCOUNTER — Other Ambulatory Visit: Payer: Self-pay | Admitting: Internal Medicine

## 2024-01-17 NOTE — Telephone Encounter (Unsigned)
 Copied from CRM [id number]. Topic: Clinical - Medication Refill >> Jan 17, 2024  3:21 PM Leah C wrote: Medication: amphetamine -dextroamphetamine  (ADDERALL) 30 MG tablet  Has the patient contacted their pharmacy? Yes (Agent: If no, request that the patient contact the pharmacy for the refill. If patient does not wish to contact the pharmacy document the reason why and proceed with request.) (Agent: If yes, when and what did the pharmacy advise?)  This is the patient's preferred pharmacy:  [facility] #[id number] - [place], [place] - [address] [place] [address] Phone: [phone] Fax: [phone]   Is this the correct pharmacy for this prescription? Yes If no, delete pharmacy and type the correct one.   Has the prescription been filled recently? Yes  Is the patient out of the medication? Just a couple left   Has the patient been seen for an appointment in the last year OR does the patient have an upcoming appointment? Yes  Can we respond through MyChart? Yes  Agent: Please be advised that Rx refills may take up to 3 business days. We ask that you follow-up with your pharmacy.

## 2024-01-18 MED ORDER — AMPHETAMINE-DEXTROAMPHETAMINE 30 MG PO TABS: 30.0000 mg | ORAL_TABLET | Freq: Two times a day (BID) | ORAL | 0 refills | Status: DC

## 2024-01-24 ENCOUNTER — Telehealth: Payer: Self-pay

## 2024-01-24 MED ORDER — AMPHETAMINE-DEXTROAMPHETAMINE 30 MG PO TABS: 30.0000 mg | ORAL_TABLET | Freq: Two times a day (BID) | ORAL | 0 refills | Status: DC

## 2024-01-24 NOTE — Addendum Note (Signed)
 Addended by: [name] on: 01/24/2024 12:42 PM   Modules accepted: Orders

## 2024-01-24 NOTE — Telephone Encounter (Signed)
 Copied from CRM #[id number]. Topic: Clinical - Prescription Issue >> Jan 24, 2024 10:03 AM Pinkey ORN wrote: Reason for CRM: amphetamine -dextroamphetamine  (ADDERALL) 30 MG tablet >> Jan 24, 2024 10:06 AM Pinkey ORN wrote: Patient states he's called previously in regards to having his medication refilled. Patient states the pharmacy still hasn't received it and it's now past it's refill date. Please follow up with patient.   Patient has requested that this medication is sent to:  [facility] #[id number] - [place], [place] - [address]  [address] [place] [address]  Phone: [phone] Fax: [phone]  Hours: Not open 24 hours

## 2024-01-24 NOTE — Telephone Encounter (Signed)
 Copied from CRM #[id number]. Topic: Clinical - Medication Question >> Jan 24, 2024 10:09 AM Pinkey ORN wrote: Reason for CRM: [facility] >> Jan 24, 2024 10:10 AM Pinkey ORN wrote: Patient states previously he was on WEAGOVY back when their was an geneticist, molecular, but states his insurance covers MOUNJARO and wants to know if he could be prescribed that and or rather or not he would have to be seen first in order to get it. Please follow up with patient.

## 2024-02-01 MED ORDER — TIRZEPATIDE-WEIGHT MANAGEMENT 2.5 MG/0.5ML ~~LOC~~ SOLN: 2.5000 mg | SUBCUTANEOUS | 11 refills | Status: AC

## 2024-02-01 MED ORDER — TIRZEPATIDE-WEIGHT MANAGEMENT 2.5 MG/0.5ML ~~LOC~~ SOLN: 2.5000 mg | SUBCUTANEOUS | 11 refills | Status: DC

## 2024-02-01 NOTE — Addendum Note (Signed)
 Addended by: [name] on: 02/01/2024 11:31 AM   Modules accepted: Orders

## 2024-02-01 NOTE — Addendum Note (Signed)
 Addended by: [name] on: 02/01/2024 05:02 PM   Modules accepted: Orders

## 2024-02-25 ENCOUNTER — Encounter: Payer: Self-pay | Admitting: Internal Medicine

## 2024-02-27 ENCOUNTER — Other Ambulatory Visit: Payer: Self-pay | Admitting: Internal Medicine

## 2024-02-27 MED ORDER — AMPHETAMINE-DEXTROAMPHETAMINE 30 MG PO TABS: 30.0000 mg | ORAL_TABLET | Freq: Two times a day (BID) | ORAL | 0 refills | Status: DC

## 2024-02-27 NOTE — Telephone Encounter (Signed)
 Copied from CRM #[id number]. Topic: Clinical - Medication Refill >> Feb 27, 2024  9:37 AM Robinson H wrote: Medication: amphetamine -dextroamphetamine  (ADDERALL) 30 MG tablet  Has the patient contacted their pharmacy? No, controlled substance (Agent: If no, request that the patient contact the pharmacy for the refill. If patient does not wish to contact the pharmacy document the reason why and proceed with request.) (Agent: If yes, when and what did the pharmacy advise?)  This is the patient's preferred pharmacy:  [facility] #[id number] - [place],  - [address] [place] [address] Phone: [phone] Fax: [phone]   Is this the correct pharmacy for this prescription? Yes If no, delete pharmacy and type the correct one.   Has the prescription been filled recently? No  Is the patient out of the medication? Yes  Has the patient been seen for an appointment in the last year OR does the patient have an upcoming appointment? Yes  Can we respond through MyChart? Yes  Agent: Please be advised that Rx refills may take up to 3 business days. We ask that you follow-up with your pharmacy.

## 2024-02-28 ENCOUNTER — Other Ambulatory Visit: Payer: Self-pay | Admitting: Internal Medicine

## 2024-03-05 ENCOUNTER — Encounter: Payer: Self-pay | Admitting: Internal Medicine

## 2024-03-05 ENCOUNTER — Ambulatory Visit: Payer: Self-pay

## 2024-03-05 DIAGNOSIS — E291 Testicular hypofunction: Secondary | ICD-10-CM

## 2024-03-05 NOTE — Telephone Encounter (Signed)
 FYI Only or Action Required?: Action required by provider: Pharmacy requiring clarification on medication refill.  Patient was last seen in primary care on 10/06/2023 by [name], MD.  Called Nurse Triage reporting Medication Problem.  Symptoms began today.  Interventions attempted: Nothing.  Symptoms are: stable.  Triage Disposition: Call PCP Now  Patient/caregiver understands and will follow disposition?: Yes  Reason for Disposition  [1] Pharmacy calling with prescription questions AND [2] triager unable to answer question  Answer Assessment - Initial Assessment Questions 1. DRUG NAME: What medicine do you need to have refilled?     Testosterone -pharmacy calling to clarify order  2. REFILLS REMAINING: How many refills are remaining? Notes: The label on the medicine or pill bottle will show how many refills are remaining. If there are no refills remaining, then a renewal may be needed.     Unknown  3. EXPIRATION DATE: What is the expiration date? Note: The label states when the prescription will expire, and thus can no longer be refilled.)     NA  4. PRESCRIBER: Who prescribed it? Note: The prescribing doctor or group is responsible for refill approvals.[name]  5. PHARMACY: Have you contacted your pharmacy (drugstore)? Note: Some pharmacies will contact the doctor (or NP/PA).      [id number]  6. SYMPTOMS: Do you have any symptoms?     NA  7. PREGNANCY: Is there any chance that you are pregnant? When was your last menstrual period?     NA  Protocols used: Medication Refill and Renewal Call-A-AH  Copied from CRM [id number]. Topic: Clinical - Medication Question >> Mar 05, 2024  4:09 PM Tinnie BROCKS wrote: Reason for CRM: Melissa with [facility] pharmacy is calling regarding pt that is there now. She is asking for clarification on his testosterone . Patient is telling her it should be injected every week rather than every 14 days.

## 2024-03-06 ENCOUNTER — Telehealth: Payer: Self-pay

## 2024-03-06 NOTE — Telephone Encounter (Signed)
 Copied from CRM [id number]. Topic: Clinical - Medication Question >> Mar 05, 2024  4:09 PM Tinnie BROCKS wrote: Reason for CRM: Melissa with [facility] pharmacy is calling regarding pt that is there now. She is asking for clarification on his testosterone . Patient is telling her it should be injected every week rather than every 14 days. >> Mar 06, 2024  2:54 PM Tinnie C wrote: Pt calling back regarding this issue, requesting to speak with a nurse. He says he has not had the shot since Monday of last week. His insurance will not cover current rx due to the way it is written. He says he has been taking .45 ml twice a week for years and is unsure how the rx instructions changed.

## 2024-03-07 MED ORDER — TESTOSTERONE CYPIONATE 200 MG/ML IM SOLN: 200.0000 mg | INTRAMUSCULAR | 1 refills | Status: DC

## 2024-03-07 NOTE — Telephone Encounter (Signed)
 This has been addressed via another phone encounter.

## 2024-03-07 NOTE — Telephone Encounter (Signed)
 Ok this was already addressed this am    thanks!

## 2024-03-13 ENCOUNTER — Telehealth: Payer: Self-pay

## 2024-03-13 NOTE — Telephone Encounter (Signed)
 Copied from CRM #[id number]. Topic: Clinical - Prescription Issue >> Mar 13, 2024  4:20 PM Chasity T wrote: Reason for CRM: patient states that medication testosterone  cypionate (DEPOTESTOSTERONE CYPIONATE) 200 MG/ML injection is still showing 6 mL to be picked up when I see that Dr Norleen sent in a 10 mL. Please call patient to confirm the mL because he has been out for a while now.  Phone number: [phone]

## 2024-03-14 ENCOUNTER — Telehealth: Payer: Self-pay

## 2024-03-14 DIAGNOSIS — E291 Testicular hypofunction: Secondary | ICD-10-CM

## 2024-03-14 MED ORDER — TESTOSTERONE CYPIONATE 200 MG/ML IM SOLN: 100.0000 mg | INTRAMUSCULAR | 1 refills | Status: DC

## 2024-03-14 NOTE — Telephone Encounter (Unsigned)
 Copied from CRM #[id number]. Topic: Clinical - Prescription Issue >> Mar 14, 2024  3:26 PM Alfonso HERO wrote: Reason for CRM: The patient is calling about his Rx. On the Rx it states  Inject 1 mL (200 mg total) into the muscle every 14 (fourteen) days. But the patient said its supposed to be 1 ML injected per week not every 14 days. Can this be correct and sent to the pharmacy?

## 2024-03-14 NOTE — Telephone Encounter (Signed)
 Ok I have done script per pt request, but please let him know we reduced the dose to 100 mg, as 200 mg every week would be too much most likely.   thanks

## 2024-03-14 NOTE — Telephone Encounter (Signed)
 Copied from CRM #[id number]. Topic: Clinical - Prescription Issue >> Mar 14, 2024  2:38 PM Revonda D wrote: Reason for CRM: Melissa with [facility] pharmacy is calling in regards to the testosterone  cypionate (DEPOTESTOSTERONE CYPIONATE) 200 MG/ML injection. She stated that the pt is stating the he takes the 1.5 a week but on the instructions it takes to take 1 ml every 14 days. Melissa stated that they have called multiple times but the medication hasn't been corrected. Melissa would like for the medication to be corrected if needed and sent to the [facility] pharmacy. Melissa would like for someone to give the pt a callback with an update.     ----------------------------------------------------------------------- From previous Reason for Contact - Medication Question: Reason for CRM:

## 2024-03-14 NOTE — Telephone Encounter (Signed)
 Copied from CRM #[id number]. Topic: Clinical - Prescription Issue >> Mar 14, 2024 11:40 AM Gustabo D wrote: Pt is calling to see what's going on with his prescription its been 2 weeks and no shots. Pt says it should be 10 ML for 56 days/ 8 weeks.   Pt says he has to go out of town needs this done today

## 2024-03-14 NOTE — Addendum Note (Signed)
 Addended by: [name] on: 03/14/2024 04:29 PM   Modules accepted: Orders

## 2024-03-15 ENCOUNTER — Telehealth: Payer: Self-pay | Admitting: Internal Medicine

## 2024-03-15 ENCOUNTER — Telehealth: Payer: Self-pay

## 2024-03-15 ENCOUNTER — Ambulatory Visit: Payer: Self-pay

## 2024-03-15 MED ORDER — TESTOSTERONE CYPIONATE 200 MG/ML IM SOLN: 100.0000 mg | INTRAMUSCULAR | 1 refills | Status: AC

## 2024-03-15 NOTE — Telephone Encounter (Signed)
 This has been handled and the pharmacy is filling the medication.

## 2024-03-15 NOTE — Telephone Encounter (Signed)
 Copied from CRM #[id number]. Topic: Clinical - Prescription Issue >> Mar 14, 2024  3:26 PM Alfonso HERO wrote: Reason for CRM: The patient is calling about his Rx. On the Rx it states  Inject 1 mL (200 mg total) into the muscle every 14 (fourteen) days. But the patient said its supposed to be 1 ML injected per week not every 14 days. Can this be correct and sent to the pharmacy? >> Mar 15, 2024 12:37 PM Macario HERO wrote: Patient called frustrated that he has not received a call back from nurse regarding his prescription issue he's been having since last week. Called [facility] advised at lunch and to send a message. Patient will also call back to follow up if he does not get a call back from nurse.  >> Mar 15, 2024 12:04 PM [name] wrote: [facility] is calling for clarification on the testosterone  cypionate (DEPOTESTOSTERONE CYPIONATE) 200 MG/ML injection rx   >> Mar 15, 2024  8:29 AM [name] wrote: Patient called.. said we did the script incorrect again?  He says he has been on the 200mg  for years?  Pls call patient seems to be misunderstanding / or clarity.

## 2024-03-15 NOTE — Telephone Encounter (Signed)
 The patient specifically asked for weekly injection, so we reduced the dose to 100 mg per shot.  . I can change back to the 200 mg if the patient wants this every 14 days.   I would not feel comfortable with more than 100 mg weekly.  I can refer to Endo and I have left mychart messages to let us  know if he wants this.  Thanks!

## 2024-03-15 NOTE — Telephone Encounter (Signed)
 Called and let Pt know this has been handled and the pharmacy is filling the medication.

## 2024-03-15 NOTE — Telephone Encounter (Signed)
 Copied from CRM #[id number]. Topic: Clinical - Prescription Issue >> Mar 15, 2024  1:28 PM Jasmin G wrote: Reason for CRM: Pt requested a call back at [phone] ASAP regarding cypionate (DEPOTESTOSTERONE CYPIONATE) 200 MG/ML injection prescription issue, pt states that he has been expecting a call back since last week and has not received anything.

## 2024-03-15 NOTE — Telephone Encounter (Signed)
 Called and let the Pt know this has been handled & the medication is being filled by the pharmacy.

## 2024-03-15 NOTE — Telephone Encounter (Signed)
 Called and let Pt know the medication is being filled.

## 2024-03-15 NOTE — Telephone Encounter (Signed)
 Copied from CRM #[id number]. Topic: Clinical - Prescription Issue >> Mar 14, 2024  3:26 PM Isaiah Gibson wrote: Reason for CRM: The patient is calling about his Rx. On the Rx it states  Inject 1 mL (200 mg total) into the muscle every 14 (fourteen) days. But the patient said its supposed to be 1 ML injected per week not every 14 days. Can this be correct and sent to the pharmacy? >> Mar 15, 2024 12:04 PM [name] wrote: [facility] is calling for clarification on the testosterone  cypionate (DEPOTESTOSTERONE CYPIONATE) 200 MG/ML injection rx   >> Mar 15, 2024  8:29 AM [name] wrote: Patient called.. said we did the script incorrect again?  He says he has been on the 200mg  for years?  Pls call patient seems to be misunderstanding / or clarity.

## 2024-03-15 NOTE — Telephone Encounter (Unsigned)
 Copied from CRM #[id number]. Topic: Clinical - Prescription Issue >> Mar 14, 2024  3:26 PM Alfonso HERO wrote: Reason for CRM: The patient is calling about his Rx. On the Rx it states  Inject 1 mL (200 mg total) into the muscle every 14 (fourteen) days. But the patient said its supposed to be 1 ML injected per week not every 14 days. Can this be correct and sent to the pharmacy? >> Mar 15, 2024  8:29 AM [name] wrote: Patient called.. said we did the script incorrect again?  He says he has been on the 200mg  for years?  Pls call patient seems to be misunderstanding / or clarity.

## 2024-03-15 NOTE — Telephone Encounter (Signed)
 This was [facility] to [facility] jan 21   testosterone  cypionate (DEPOTESTOSTERONE CYPIONATE) 200 MG/ML injection Inject 0.5 mLs (100 mg total) into the muscle every 7 (seven) days. Dispense: 10 mL, Refills: 1 ordered  03/14/2024 06/12/2024 [name], MD           Summary:Inject 0.5 mLs (100 mg total) into the muscle every 7 (seven) days., Starting Wed 03/14/2024, Until Tue 06/12/2024, Normal Dose, Route, Frequency:100 mg, Intramuscular, Every 7 daysDx Associated:Start Date & Time:01/21/2026End w/ Doses:06/12/2024 after 13 dosesOrd/Sold:03/14/2024 (O)Ordered On:01/21/2026ReportPharmacy:[id number] - [place], [place] - [address]      Ordering Department:[facility] Dose Histor    I believe this should not need to further changed,   Thanks

## 2024-03-15 NOTE — Telephone Encounter (Signed)
 FYI Only or Action Required?: Action required by provider: [facility] pharmacy has questions about the testosterone  Rx. See note below and please advise the pharmacy on whether or not to refill as written.  Patient was last seen in primary care on 10/06/2023 by [name], MD.  Called Nurse Triage reporting Medication Problem. .  Triage Disposition: Information or Advice Only Call   Reason for Disposition  Pharmacy calling with prescription question and triager answers question  Answer Assessment - Initial Assessment Questions 1. NAME of MEDICINE: What medicine(s) are you calling about?     Testosterone   2. QUESTION: What is your question? (e.g., double dose of medicine, side effect)     [facility] pharmacy asking 1.) the Rx is for 1 ml q 14 days but his chart says .5ml every 7 days, do you want them to fill it as prescribed for 1 ml q 14 days? 2.) the pt is stating he is out of testosterone  but he got a 90 day refill back in November and should have 2 weeks' worth of medication left.  Protocols used: Medication Question Call-A-AH

## 2024-03-15 NOTE — Telephone Encounter (Signed)
 I have called and spoke with the pharmacy and they will fill the prescription as prescribed by the provider.

## 2024-03-15 NOTE — Addendum Note (Signed)
 Addended by: [name] on: 03/15/2024 04:55 PM   Modules accepted: Orders

## 2024-03-15 NOTE — Telephone Encounter (Signed)
 My chart message sent to pt.

## 2024-03-15 NOTE — Telephone Encounter (Signed)
 This has been handled.

## 2024-03-15 NOTE — Telephone Encounter (Signed)
 Pt specifically requested injection weekly, so that is why the 100 mg dosing.   Thanks!

## 2024-03-27 NOTE — Telephone Encounter (Signed)
 Requesting: amphetamine -dextroamphetamine  (ADDERALL) 30 MG tablet [[id number]]  Last Visit:  Next Visit: 04/23/2024 Last Refill: 02/27/2024  Please Advise

## 2024-03-27 NOTE — Telephone Encounter (Signed)
 Copied from CRM #[id number]. Topic: Clinical - Medication Refill >> Mar 27, 2024  9:16 AM Macario HERO wrote: Medication: amphetamine -dextroamphetamine  (ADDERALL) 30 MG tablet [[id number]]  Has the patient contacted their pharmacy? No (Agent: If no, request that the patient contact the pharmacy for the refill. If patient does not wish to contact the pharmacy document the reason why and proceed with request.) (Agent: If yes, when and what did the pharmacy advise?)  This is the patient's preferred pharmacy:  [id number] - [place], [place] - [id number] [place] [address] Phone: [phone] Fax: [phone]   Is this the correct pharmacy for this prescription? Yes If no, delete pharmacy and type the correct one.   Has the prescription been filled recently? Yes  Is the patient out of the medication? Yes  Has the patient been seen for an appointment in the last year OR does the patient have an upcoming appointment? Yes  Can we respond through MyChart? Yes  Agent: Please be advised that Rx refills may take up to 3 business days. We ask that you follow-up with your pharmacy.

## 2024-03-30 MED ORDER — AMPHETAMINE-DEXTROAMPHETAMINE 30 MG PO TABS: 30.0000 mg | ORAL_TABLET | Freq: Two times a day (BID) | ORAL | 0 refills | Status: AC

## 2024-03-30 NOTE — Telephone Encounter (Signed)
 Ok this refill I done erx

## 2024-03-30 NOTE — Addendum Note (Signed)
 Addended by: [name] on: 03/30/2024 02:37 PM   Modules accepted: Orders

## 2024-04-23 ENCOUNTER — Encounter: Admitting: Family Medicine

## 2024-07-11 ENCOUNTER — Ambulatory Visit: Admitting: Endocrinology
